# Patient Record
Sex: Male | Born: 1955
Health system: Southern US, Community
[De-identification: ages and names within clinical notes are randomized; demographics above are authoritative.]

## PROBLEM LIST (undated history)

## (undated) DIAGNOSIS — E119 Type 2 diabetes mellitus without complications: Secondary | ICD-10-CM

## (undated) DIAGNOSIS — F0390 Unspecified dementia without behavioral disturbance: Secondary | ICD-10-CM

## (undated) DIAGNOSIS — I10 Essential (primary) hypertension: Secondary | ICD-10-CM

## (undated) DIAGNOSIS — R6250 Unspecified lack of expected normal physiological development in childhood: Secondary | ICD-10-CM

## (undated) DIAGNOSIS — D869 Sarcoidosis, unspecified: Secondary | ICD-10-CM

## (undated) DIAGNOSIS — E785 Hyperlipidemia, unspecified: Secondary | ICD-10-CM

## (undated) HISTORY — DX: Hyperlipidemia, unspecified: E78.5

## (undated) HISTORY — DX: Essential (primary) hypertension: I10

## (undated) HISTORY — PX: TONSILLECTOMY: SUR1361

## (undated) HISTORY — DX: Unspecified dementia, unspecified severity, without behavioral disturbance, psychotic disturbance, mood disturbance, and anxiety: F03.90

## (undated) HISTORY — PX: ADENOIDECTOMY: SUR15

## (undated) HISTORY — DX: Type 2 diabetes mellitus without complications: E11.9

## (undated) HISTORY — DX: Sarcoidosis, unspecified: D86.9

---

## 1983-04-02 DIAGNOSIS — D869 Sarcoidosis, unspecified: Secondary | ICD-10-CM

## 1983-04-02 HISTORY — DX: Sarcoidosis, unspecified: D86.9

## 2007-09-16 ENCOUNTER — Ambulatory Visit: Payer: Self-pay | Admitting: Cardiology

## 2007-09-28 ENCOUNTER — Encounter: Payer: Self-pay | Admitting: Cardiology

## 2007-09-28 ENCOUNTER — Ambulatory Visit: Payer: Self-pay

## 2007-12-02 ENCOUNTER — Ambulatory Visit: Payer: Self-pay | Admitting: Cardiology

## 2009-01-26 ENCOUNTER — Encounter (INDEPENDENT_AMBULATORY_CARE_PROVIDER_SITE_OTHER): Payer: Self-pay | Admitting: *Deleted

## 2009-01-31 DIAGNOSIS — I10 Essential (primary) hypertension: Secondary | ICD-10-CM

## 2009-01-31 DIAGNOSIS — I152 Hypertension secondary to endocrine disorders: Secondary | ICD-10-CM | POA: Insufficient documentation

## 2009-01-31 DIAGNOSIS — E1159 Type 2 diabetes mellitus with other circulatory complications: Secondary | ICD-10-CM

## 2009-01-31 DIAGNOSIS — D869 Sarcoidosis, unspecified: Secondary | ICD-10-CM

## 2009-01-31 DIAGNOSIS — E119 Type 2 diabetes mellitus without complications: Secondary | ICD-10-CM | POA: Insufficient documentation

## 2009-02-07 ENCOUNTER — Encounter (INDEPENDENT_AMBULATORY_CARE_PROVIDER_SITE_OTHER): Payer: Self-pay | Admitting: *Deleted

## 2010-02-19 ENCOUNTER — Encounter: Payer: Self-pay | Admitting: Gastroenterology

## 2010-03-15 ENCOUNTER — Encounter (INDEPENDENT_AMBULATORY_CARE_PROVIDER_SITE_OTHER): Payer: Self-pay | Admitting: *Deleted

## 2010-03-19 ENCOUNTER — Encounter (INDEPENDENT_AMBULATORY_CARE_PROVIDER_SITE_OTHER): Payer: Self-pay | Admitting: *Deleted

## 2010-03-19 ENCOUNTER — Ambulatory Visit: Payer: Self-pay | Admitting: Gastroenterology

## 2010-04-04 ENCOUNTER — Ambulatory Visit
Admission: RE | Admit: 2010-04-04 | Discharge: 2010-04-04 | Payer: Self-pay | Source: Home / Self Care | Attending: Gastroenterology | Admitting: Gastroenterology

## 2010-04-04 ENCOUNTER — Encounter: Payer: Self-pay | Admitting: Gastroenterology

## 2010-04-04 LAB — HM COLONOSCOPY

## 2010-04-04 LAB — GLUCOSE, CAPILLARY
Glucose-Capillary: 139 mg/dL — ABNORMAL HIGH (ref 70–99)
Glucose-Capillary: 123 mg/dL — ABNORMAL HIGH (ref 70–99)

## 2010-04-09 ENCOUNTER — Encounter: Payer: Self-pay | Admitting: Gastroenterology

## 2010-05-01 NOTE — Letter (Signed)
Summary: Pre Visit Letter Revised  [facility]  [address] [place], [place] [address]   Phone: [phone]  Fax: [phone]        02/19/2010 MRN: [id number]  Eric Stuart [address] [place], [place]  [address]             Procedure Date:  04-04-2010 9am           Dr Arlyce Dice   Welcome to the Gastroenterology Division at [facility].    You are scheduled to see a nurse for your pre-procedure visit on 03-19-10 at 2:30pm on the 3rd floor at [facility], [address]  We ask that you try to arrive at our office 15 minutes prior to your appointment time to allow for check-in.  Please take a minute to review the attached form.  If you answer "Yes" to one or more of the questions on the first page, we ask that you call the person listed at your earliest opportunity.  If you answer "No" to all of the questions, please complete the rest of the form and bring it to your appointment.    Your nurse visit will consist of discussing your medical and surgical history, your immediate family medical history, and your medications.   If you are unable to list all of your medications on the form, please bring the medication bottles to your appointment and we will list them.  We will need to be aware of both prescribed and over the counter drugs.  We will need to know exact dosage information as well.    Please be prepared to read and sign documents such as consent forms, a financial agreement, and acknowledgement forms.  If necessary, and with your consent, a friend or relative is welcome to sit-in on the nurse visit with you.  Please bring your insurance card so that we may make a copy of it.  If your insurance requires a referral to see a specialist, please bring your referral form from your primary care physician.  No co-pay is required for this nurse visit.     If you cannot keep your appointment, please call [phone] to cancel or reschedule prior to your  appointment date.  This allows [place] the opportunity to schedule an appointment for another patient in need of care.    Thank you for choosing [facility] for your medical needs.  We appreciate the opportunity to care for you.  Please visit [place] at our website  to learn more about our practice.  Sincerely, The Gastroenterology Division

## 2010-05-03 NOTE — Procedures (Signed)
Summary: Colonoscopy  Patient: Eric Stuart Note: All result statuses are Final unless otherwise noted.  Tests: (1) Colonoscopy (COL)   COL Colonoscopy           DONE     [facility]     [address]     [place], [place]  [address]           COLONOSCOPY PROCEDURE REPORT           PATIENT:  Eric Stuart  MR#:  [id number]     BIRTHDATE:  January 03, 1956, 54 yrs. old  GENDER:  male           ENDOSCOPIST:  Barbette Hair. Arlyce Dice, MD     Referred by:  Hali Marry, M.D.     Charisse Klinefelter, M.D.           PROCEDURE DATE:  04/04/2010     PROCEDURE:  Colonoscopy with snare polypectomy     ASA CLASS:  Class II     INDICATIONS:  1) Routine Risk Screening           MEDICATIONS:   Fentanyl 50 mcg IV, Versed 5 mg IV           DESCRIPTION OF PROCEDURE:   After the risks benefits and     alternatives of the procedure were thoroughly explained, informed     consent was obtained.  Digital rectal exam was performed and     revealed no abnormalities.   The LB [id number] endoscope     was introduced through the anus and advanced to the cecum, which     was identified by both the appendix and ileocecal valve, limited     by poor preparation.  Moderate amount of retained solid and liquid     stool  The quality of the prep was Moviprep fair.  The instrument     was then slowly withdrawn as the colon was fully examined.     <<PROCEDUREIMAGES>>           FINDINGS:  A sessile polyp was found in the sigmoid colon. It was     12 mm in size. It was found 20 cm from the point of entry. Polyp     was snared, then cauterized with monopolar cautery. Retrieval was     successful (see image9). snare polyp  This was otherwise a normal     examination of the colon (see image2, image3, image5, image6,     image7, image10, and image12).   Retroflexed views in the rectum     revealed no abnormalities.    The time to cecum =  1.50  minutes.     The scope was then withdrawn (time =  7.0  min) from the  patient     and the procedure completed.           COMPLICATIONS:  None           ENDOSCOPIC IMPRESSION:     1) 12 mm sessile polyp in the sigmoid colon     2) Otherwise normal examination     RECOMMENDATIONS:     1) Colonoscopy 3 years           REPEAT EXAM:   3 year(s) Colonoscopy (decreased f/u interval due     to limitations of exam from prep and polyp size)           ______________________________     Barbette Hair. Arlyce Dice, MD  CC:           n.     eSIGNED:   Barbette Hair. Kaplan at 04/04/2010 09:21 AM           Henreitta Cea, [id number]  Note: An exclamation mark (!) indicates a result that was not dispersed into the flowsheet. Document Creation Date: 04/04/2010 9:21 AM _______________________________________________________________________  (1) Order result status: Final Collection or observation date-time: 04/04/2010 09:11 Requested date-time:  Receipt date-time:  Reported date-time:  Referring Physician:   Ordering Physician: Melvia Heaps [phone]) Specimen Source:  Source: Launa Grill Order Number: [id number] Lab site:   Appended Document: Colonoscopy     Procedures Next Due Date:    Colonoscopy: 04/2013

## 2010-05-03 NOTE — Letter (Signed)
Summary: [facility] Instructions  [facility]  [address] [place], [place] [address]   Phone: [phone]  Fax: [phone]       Eric Stuart    [birth year]    MRN: [id number]        Procedure Day /Date:  04/04/10  Wednesday     Arrival Time:  8:00am      Procedure Time:  9:00am     Location of Procedure:                    _ x_  [facility] (4th Floor)                        PREPARATION FOR COLONOSCOPY WITH MOVIPREP   Starting 5 days prior to your procedure _12/31/11 _ do not eat nuts, seeds, popcorn, corn, beans, peas,  salads, or any raw vegetables.  Do not take any fiber supplements (e.g. Metamucil, Citrucel, and Benefiber).  THE DAY BEFORE YOUR PROCEDURE         DATE:  04/03/10   DAY:  Tuesday  1.  Drink clear liquids the entire day-NO SOLID FOOD  2.  Do not drink anything colored red or purple.  Avoid juices with pulp.  No orange juice.  3.  Drink at least 64 oz. (8 glasses) of fluid/clear liquids during the day to prevent dehydration and help the prep work efficiently.  CLEAR LIQUIDS INCLUDE: Water Jello Ice Popsicles Tea (sugar ok, no milk/cream) Powdered fruit flavored drinks Coffee (sugar ok, no milk/cream) Gatorade Juice: apple, white grape, white cranberry  Lemonade Clear bullion, consomm, broth Carbonated beverages (any kind) Strained chicken noodle soup Hard Candy                             4.  In the morning, mix first dose of MoviPrep solution:    Empty 1 Pouch A and 1 Pouch B into the disposable container    Add lukewarm drinking water to the top line of the container. Mix to dissolve    Refrigerate (mixed solution should be used within 24 hrs)  5.  Begin drinking the prep at 5:00 p.m. The MoviPrep container is divided by 4 marks.   Every 15 minutes drink the solution down to the next mark (approximately 8 oz) until the full liter is complete.   6.  Follow completed prep with 16 oz of clear liquid of your  choice (Nothing red or purple).  Continue to drink clear liquids until bedtime.  7.  Before going to bed, mix second dose of MoviPrep solution:    Empty 1 Pouch A and 1 Pouch B into the disposable container    Add lukewarm drinking water to the top line of the container. Mix to dissolve    Refrigerate  THE DAY OF YOUR PROCEDURE      DATE:  04/04/10  DAY:  Wednesday  Beginning at  4:00 a.m. (5 hours before procedure):         1. Every 15 minutes, drink the solution down to the next mark (approx 8 oz) until the full liter is complete.  2. Follow completed prep with 16 oz. of clear liquid of your choice.    3. You may drink clear liquids until   7:00am  (2 HOURS BEFORE PROCEDURE).   MEDICATION INSTRUCTIONS  Unless otherwise instructed, you should take regular prescription medications with a  small sip of water   as early as possible the morning of your procedure.  Diabetic patients - see separate instructions.          OTHER INSTRUCTIONS  You will need a responsible adult at least 54 years of age to accompany you and drive you home.   This person must remain in the waiting room during your procedure.  Wear loose fitting clothing that is easily removed.  Leave jewelry and other valuables at home.  However, you may wish to bring a book to read or  an iPod/MP3 player to listen to music as you wait for your procedure to start.  Remove all body piercing jewelry and leave at home.  Total time from sign-in until discharge is approximately 2-3 hours.  You should go home directly after your procedure and rest.  You can resume normal activities the  day after your procedure.  The day of your procedure you should not:   Drive   Make legal decisions   Operate machinery   Drink alcohol   Return to work  You will receive specific instructions about eating, activities and medications before you leave.    The above instructions have been reviewed and explained to me by    Ezra Sites RN  March 19, 2010 2:48 PM     I fully understand and can verbalize these instructions _____________________________ Date _________

## 2010-05-03 NOTE — Letter (Signed)
Summary: Diabetic Instructions  [facility]  [address] [place], [place] [address]   Phone: [phone]  Fax: [phone]    Eric Stuart Aug 20, 1955 MRN: [id number]   Metformin and Glyburide  ORAL DIABETIC MEDICATION INSTRUCTIONS  The day before your procedure:   Take your diabetic pill as you do normally  The day of your procedure:   Do not take your diabetic pill    We will check your blood sugar levels during the admission process and again in Recovery before discharging you home  ________________________________________________________________________

## 2010-05-03 NOTE — Miscellaneous (Signed)
Summary: [facility]  Clinical Lists Changes  Medications: Added new medication of MOVIPREP 100 GM  SOLR (PEG-KCL-NACL-NASULF-NA ASC-C) As per prep instructions. - Signed Rx of MOVIPREP 100 GM  SOLR (PEG-KCL-NACL-NASULF-NA ASC-C) As per prep instructions.;  #1 x 0;  Signed;  Entered by: Ezra Sites RN;  Authorized by: Louis Meckel MD;  Method used: Electronically to [facility] [phone]*, [address], [place], [place], [place]  [address], Ph: [phone] or [phone], Fax: [phone] Observations: Added new observation of NKA: T (03/19/2010 14:05)    Prescriptions: MOVIPREP 100 GM  SOLR (PEG-KCL-NACL-NASULF-NA ASC-C) As per prep instructions.  #1 x 0   Entered by:   Ezra Sites RN   Authorized by:   Louis Meckel MD   Signed by:   Ezra Sites RN on 03/19/2010   Method used:   Electronically to        [facility] [phone]* (retail)       [address] [place], [place]  [address]       Ph: [phone] or [phone]       Fax: [phone]   RxID:   [id number]

## 2010-05-03 NOTE — Letter (Signed)
Summary: Patient Notice- Polyp Results  [facility]  [address] [place], [place] [address]   Phone: [phone]  Fax: [phone]        April 09, 2010 MRN: [id number]    Eric Stuart [address] [place], [place]  [address]    Dear [name],  I am pleased to inform you that the colon polyp(s) removed during your recent colonoscopy was (were) found to be benign (no cancer detected) upon pathologic examination.  I recommend you have a repeat colonoscopy examination in 3_ years to look for recurrent polyps, as having colon polyps increases your risk for having recurrent polyps or even colon cancer in the future.  Should you develop new or worsening symptoms of abdominal pain, bowel habit changes or bleeding from the rectum or bowels, please schedule an evaluation with either your primary care physician or with me.  Additional information/recommendations:  __ No further action with gastroenterology is needed at this time. Please      follow-up with your primary care physician for your other healthcare      needs.  __ Please call [phone] to schedule a return visit to review your      situation.  __ Please keep your follow-up visit as already scheduled.  _x_ Continue treatment plan as outlined the day of your exam.  Please call us if you are having persistent problems or have questions about your condition that have not been fully answered at this time.  Sincerely,  Louis Meckel MD  This letter has been electronically signed by your physician.  Appended Document: Patient Notice- Polyp Results Letter mailed

## 2010-08-14 NOTE — Assessment & Plan Note (Signed)
[facility]                            CARDIOLOGY OFFICE NOTE   Eric Stuart, Eric Stuart                       MRN:          [id number]  DATE:12/02/2007                            DOB:          1955-07-30    PRIMARY CARE PHYSICIAN:  Delaney Meigs, MD   REASON FOR PRESENTATION:  Evaluate the patient with hypertension and an  abnormal EKG.   HISTORY OF PRESENT ILLNESS:  The patient is a pleasant 55 year old  gentleman who I saw in June for evaluation of an abnormal EKG.  He had a  history of hypertension which had been recently diagnosed and was  somewhat elevated when I saw him.  He did not continue his medicines,  and I encouraged him to comply.  I did get an echocardiogram which  demonstrated well-preserved ejection fraction with some evidence of left  ventricular hypertrophy.  This is likely because of the appearance of  the abnormal EKG, which resembles LVH with strain.  The patient has been  taking his amlodipine.  He returns now for followup.  He has been having  no symptoms.  He pedals his bicycle all over the place and gets no chest  discomfort, neck or arm discomfort.  He gets no shortness of breath,  denies any PND or orthopnea.  He has had no palpitations, presyncope, or  syncope.   PAST MEDICAL HISTORY:  Hypertension recently diagnosed, diabetes  mellitus, and sarcoidosis treated in the past with steroids.   ALLERGIES:  None.   MEDICATIONS:  1. Omeprazole 20 mg b.i.d.  2. Amlodipine 5 mg daily.  3. Metformin 500 mg b.i.d.   REVIEW OF SYSTEMS:  As stated in the HPI and otherwise negative for all  other systems.   PHYSICAL EXAMINATION:  GENERAL:  The patient is in no distress.  VITAL SIGNS:  Blood pressure 160/102 and heart rate 52 and regular.  HEENT:  Eyelids unremarkable.  Pupils equal, round and reactive to  light.  Fundi within normal limits.  Oral mucosa unremarkable.  NECK:  No jugular venous distension at 45 degrees,  carotid upstroke  brisk and symmetrical, no bruits, no thyromegaly.  LYMPHATICS:  No cervical, axillary or inguinal adenopathy.  LUNGS:  Clear to auscultation bilaterally.  BACK:  No costovertebral angle tenderness.  CHEST:  Unremarkable.  HEART:  PMI not displaced or sustained.  S1 and S2 within normal limits.  No S3, no S4.  No clicks, no rubs, no murmurs.  ABDOMEN:  Flat, positive bowel sounds, normal in frequency and pitch.  No bruits.  No rebound, no guarding, no midline pulsatile mass.  No  hepatomegaly, no splenomegaly.  SKIN:  No rashes, no nodules.  EXTREMITIES:  2+ pulses throughout.  No edema, no cyanosis, no clubbing.  NEURO:  Oriented to person, place and time.  Cranial nerves II through  XII grossly intact.  Motor grossly intact.   EKG, sinus rhythm, rate 52, axis within normal limits, intervals within  normal limits, left ventricular hypertrophy with repolarization changes  with no change from previous EKGs.   ASSESSMENT AND PLAN:  1. Abnormal EKG.  This really likely represents left ventricular      hypertrophy with repolarization.  No further cardiovascular testing      is suggested.  2. Hypertension.  Blood pressure is still not well controlled.  I am      going to increase his amlodipine to 10 mg daily.  3. Diabetes.  He continues on metformin as he was followed by Dr. Ardeen Garland.  4. Dyslipidemia.  The patient did have a lipid profile and was to be      on Crestor.  I do not see that he started this and we will call to      clarify this.  I agree with aggressive management.  5. Followup.  I will see him in 6 months or sooner if needed.     Rollene Rotunda, MD, [facility]  Electronically Signed    JH/MedQ  DD: 12/02/2007  DT: 12/03/2007  Job #: [id number]   cc:   Delaney Meigs, M.D.

## 2010-08-14 NOTE — Assessment & Plan Note (Signed)
[facility]                            CARDIOLOGY OFFICE NOTE   Eric, Stuart                       MRN:          [id number]  DATE:09/16/2007                            DOB:          1955/05/03    PRIMARY CARE PHYSICIAN:  Delaney Meigs, MD   REASON FOR CONSULTATION:  Evaluate the patient with bradycardia and an  abnormal EKG.   HISTORY OF PRESENT ILLNESS:  The patient is a pleasant 55 year old  gentleman without prior cardiac history.  However, it does not seem like  he has actually seen physicians very frequently.  He has recently been  diagnosed with hypertension and diabetes, but he just started seeing Dr.  Lysbeth Galas.  He was placed on amlodipine, but actually he ran out of this  medicine, and his sister is not sure for how long.  The patient lives  with his sister.  He gets around with a bicycle.  He has been doing this  since he was a child.  He pedals many miles every day.  With this  activity, he denies any chest discomfort, neck, or arm discomfort.  He  has had no palpitations, presyncope, or syncope.  He has had no PND or  orthopnea.   Of note, the patient has been noted to have sinus bradycardia with the  rates in the 50s.  He also has an EKG with left ventricular hypertrophy  by voltage criteria with a strain pattern.   PAST MEDICAL HISTORY:  1. Hypertension, recently diagnosed.  2. Diabetes mellitus, recently diagnosed.  3. Sarcoidosis, treated in the past with steroids.   PAST SURGICAL HISTORY:  None.   ALLERGIES:  None.   MEDICATIONS:  1. Omeprazole 20 mg b.i.d.  2. Amlodipine 5 mg daily.  3. Metformin 500 mg b.i.d.   SOCIAL HISTORY:  The patient is single.  He does not have any children.  He lives with his sister.  He smoked rarely for the past 20 years.  He  does not drink alcohol.   FAMILY HISTORY:  Noncontributory for early coronary artery disease.  His  father did die of congestive heart failure at 19.   REVIEW OF SYSTEMS:  As stated in the HPI and otherwise positive for  reflux.  Negative for all other systems.   PHYSICAL EXAMINATION:  The patient is in no distress.  Blood pressure  188/102, heart rate 51 and regular, body mass index 24, and weight 141  pounds.  HEENT:  Eyes unremarkable; pupils equal, round, and react to light;  fundi not visualized; oral mucosa is unremarkable.  NECK:  No jugular venous distention; waveform within normal limits;  carotid upstroke brisk and symmetrical, no bruits, and no thyromegaly.  LYMPHATICS:  No cervical, axillary, or inguinal adenopathy.  LUNGS:  Clear to auscultation bilaterally.  BACK:  No costovertebral angle tenderness.  CHEST:  Unremarkable.  HEART:  PMI displaced somewhat laterally; S1 and S2 within normal  limits; no S3, no S4, no clicks, no rubs, no murmurs.  ABDOMEN:  Flat; positive bowel sounds normal in frequency and pitch; no  bruits, no rebound,  no guarding, no midline pulsatile mass, no  hepatomegaly, no splenomegaly.  SKIN:  No rashes, no nodules.  EXTREMITIES:  Pulses 2+ throughout; no edema, no cyanosis, no clubbing.  NEURO:  Oriented to person, place, and time, cranial nerves II-XII  grossly intact, motor grossly intact.   ASSESSMENT AND PLAN:  1. Hypertension.  The patient's blood pressure is elevated, but he did      not take his amlodipine.  He has been out of it for a few days.  He      will get this refill today and start it.  I have encouraged him to      wait about 10 days and then go to see Dr. Lysbeth Galas again for blood      pressure check.  2. Abnormal EKG.  The patient most likely has left ventricular      hypertrophy.  I am going to get an echocardiogram to evaluate to      what degree and make sure he has normal left ventricular systolic      function.  We will also make sure there is no occult valvular      disease.  3. Bradycardia.  The patient is not having any symptoms related to      this.  He has not had  any presyncope or syncope.  He probably has      bradycardia based on the fact that he has a long history of      exercise, pedaling his bicycle miles daily.  At this point, in the      absence of any other symptoms, I would not suggest further testing      is necessary.  In particular, I do not suspect sarcoid involvement,      though again we will get the echocardiogram to see if there is any      evidence of any wall motion abnormalities.  4. Followup will be based on the results of the above.     Rollene Rotunda, MD, [facility]  Electronically Signed    JH/MedQ  DD: 09/16/2007  DT: 09/17/2007  Job #: [id number]   cc:   Delaney Meigs, M.D.

## 2010-08-31 ENCOUNTER — Encounter: Payer: Self-pay | Admitting: Physician Assistant

## 2011-09-27 DIAGNOSIS — I10 Essential (primary) hypertension: Secondary | ICD-10-CM | POA: Diagnosis not present

## 2011-09-27 DIAGNOSIS — E785 Hyperlipidemia, unspecified: Secondary | ICD-10-CM | POA: Diagnosis not present

## 2011-10-29 DIAGNOSIS — H524 Presbyopia: Secondary | ICD-10-CM | POA: Diagnosis not present

## 2011-10-29 DIAGNOSIS — H52229 Regular astigmatism, unspecified eye: Secondary | ICD-10-CM | POA: Diagnosis not present

## 2011-10-29 DIAGNOSIS — H5200 Hypermetropia, unspecified eye: Secondary | ICD-10-CM | POA: Diagnosis not present

## 2011-10-29 DIAGNOSIS — E119 Type 2 diabetes mellitus without complications: Secondary | ICD-10-CM | POA: Diagnosis not present

## 2012-04-02 DIAGNOSIS — I10 Essential (primary) hypertension: Secondary | ICD-10-CM | POA: Diagnosis not present

## 2012-04-02 DIAGNOSIS — Z125 Encounter for screening for malignant neoplasm of prostate: Secondary | ICD-10-CM | POA: Diagnosis not present

## 2012-04-02 DIAGNOSIS — E785 Hyperlipidemia, unspecified: Secondary | ICD-10-CM | POA: Diagnosis not present

## 2012-04-02 DIAGNOSIS — E559 Vitamin D deficiency, unspecified: Secondary | ICD-10-CM | POA: Diagnosis not present

## 2012-04-27 DIAGNOSIS — Z23 Encounter for immunization: Secondary | ICD-10-CM | POA: Diagnosis not present

## 2012-09-04 ENCOUNTER — Ambulatory Visit (INDEPENDENT_AMBULATORY_CARE_PROVIDER_SITE_OTHER): Payer: Medicaid Other | Admitting: Physician Assistant

## 2012-09-04 ENCOUNTER — Encounter: Payer: Self-pay | Admitting: Physician Assistant

## 2012-09-04 VITALS — BP 183/120 | HR 68 | Temp 97.4°F | Ht 61.0 in | Wt 125.0 lb

## 2012-09-04 DIAGNOSIS — E785 Hyperlipidemia, unspecified: Secondary | ICD-10-CM

## 2012-09-04 DIAGNOSIS — E119 Type 2 diabetes mellitus without complications: Secondary | ICD-10-CM

## 2012-09-04 DIAGNOSIS — I10 Essential (primary) hypertension: Secondary | ICD-10-CM | POA: Diagnosis not present

## 2012-09-04 LAB — POCT GLYCOSYLATED HEMOGLOBIN (HGB A1C): Hemoglobin A1C: 6.4

## 2012-09-04 NOTE — Progress Notes (Signed)
Subjective:     Patient ID: Eric Stuart, male   DOB: 05/04/1955, 57 y.o.   MRN: [id number]  HPI Pt here for recheck of his diabetes, HTN, hyperlipid He states he has been doing well Denies CP,SOB, or lower ext edema He has not been doing his foot checks or BS checks Does not remember when his last eye exam   Review of Systems  All other systems reviewed and are negative.       Objective:   Physical Exam  Nursing note and vitals reviewed.  No JVD/Bruits Heart- RRR w/o M Lungs- CTA Pulses equal in upper ext No lower ext edema ulceration    Assessment:     1. DM   2. HTN (hypertension)   3. Other and unspecified hyperlipidemia        Plan:     Will stop Zestril Increase Benicar 40mg  1 po qd Cont all other meds BS in good control Needs eye exam Reviewed foot checks Recheck in 3-4 wks F/U prn

## 2012-09-04 NOTE — Patient Instructions (Signed)

## 2012-09-05 LAB — BASIC METABOLIC PANEL WITH GFR
BUN: 10 mg/dL (ref 6–23)
CO2: 28 mEq/L (ref 19–32)
Calcium: 10 mg/dL (ref 8.4–10.5)
Chloride: 105 mEq/L (ref 96–112)
Creat: 0.8 mg/dL (ref 0.50–1.35)
GFR, Est African American: >89 mL/min
GFR, Est Non African American: >89 mL/min
Glucose, Bld: 113 mg/dL — ABNORMAL HIGH (ref 70–99)
Potassium: 4.3 mEq/L (ref 3.5–5.3)
Sodium: 141 mEq/L (ref 135–145)

## 2012-09-05 LAB — HEPATIC FUNCTION PANEL
ALT: 13 U/L (ref 0–53)
AST: 17 U/L (ref 0–37)
Albumin: 4.6 g/dL (ref 3.5–5.2)
Alkaline Phosphatase: 51 U/L (ref 39–117)
Bilirubin, Direct: 0.2 mg/dL (ref 0.0–0.3)
Indirect Bilirubin: 0.8 mg/dL (ref 0.0–0.9)
Total Bilirubin: 1 mg/dL (ref 0.3–1.2)
Total Protein: 7.6 g/dL (ref 6.0–8.3)

## 2012-09-05 LAB — LIPID PANEL
Cholesterol: 134 mg/dL (ref 0–200)
HDL: 63 mg/dL (ref >39)
LDL Cholesterol: 61 mg/dL (ref 0–99)
Total CHOL/HDL Ratio: 2.1 Ratio
Triglycerides: 48 mg/dL (ref <150)
VLDL: 10 mg/dL (ref 0–40)

## 2012-09-07 ENCOUNTER — Telehealth: Payer: Self-pay | Admitting: *Deleted

## 2012-09-08 NOTE — Telephone Encounter (Signed)
lmtcb for lab results

## 2012-09-15 NOTE — Telephone Encounter (Signed)
Pt was informed of lab results.

## 2012-09-25 ENCOUNTER — Ambulatory Visit (INDEPENDENT_AMBULATORY_CARE_PROVIDER_SITE_OTHER): Payer: Medicare Other | Admitting: Physician Assistant

## 2012-09-25 ENCOUNTER — Encounter: Payer: Self-pay | Admitting: Physician Assistant

## 2012-09-25 DIAGNOSIS — I10 Essential (primary) hypertension: Secondary | ICD-10-CM

## 2012-09-25 MED ORDER — OLMESARTAN MEDOXOMIL-HCTZ 40-25 MG PO TABS: 1.0000 | ORAL_TABLET | Freq: Every day | ORAL | Status: DC

## 2012-09-25 NOTE — Patient Instructions (Signed)

## 2012-09-25 NOTE — Progress Notes (Signed)
Subjective:     Patient ID: Eric Stuart, male   DOB: [birth year], 56 y.o.   MRN: [id number]  HPI Pt here for recheck of his HTN He was on an ACE/ARB combination We stopped the Zestril and increased the dose of Benicar Pt denies and CP, SOB, HA, or lower ext edema since last visit  Review of Systems  All other systems reviewed and are negative.       Objective:   Physical Exam  Nursing note and vitals reviewed. No bruits/JVD Heart- RRR w/o M Lungs- CTA bilat Pulses equal in upper ext     Assessment:     HTN    Plan:     BP still suboptimal Samples Benicar 40/25 given today Recheck in 3-4 weeks Prev Bun/creat good

## 2012-10-26 ENCOUNTER — Other Ambulatory Visit: Payer: Self-pay

## 2012-10-26 ENCOUNTER — Other Ambulatory Visit: Payer: Self-pay | Admitting: Family Medicine

## 2012-10-26 DIAGNOSIS — I10 Essential (primary) hypertension: Secondary | ICD-10-CM

## 2012-10-26 MED ORDER — ROSUVASTATIN CALCIUM 10 MG PO TABS: 10.0000 mg | ORAL_TABLET | Freq: Every day | ORAL | Status: DC

## 2012-10-26 MED ORDER — METFORMIN HCL 1000 MG PO TABS: 1000.0000 mg | ORAL_TABLET | Freq: Two times a day (BID) | ORAL | Status: DC

## 2012-10-26 MED ORDER — OLMESARTAN MEDOXOMIL-HCTZ 40-25 MG PO TABS: 1.0000 | ORAL_TABLET | Freq: Every day | ORAL | Status: DC

## 2012-10-26 NOTE — Telephone Encounter (Signed)
Patient is not on Sertraline  Disregard previous message

## 2012-10-26 NOTE — Telephone Encounter (Signed)
Patient seen 09/25/12  WLW  Lisinopril and Sertraline not on patients med list in [facility]

## 2012-10-27 ENCOUNTER — Ambulatory Visit (INDEPENDENT_AMBULATORY_CARE_PROVIDER_SITE_OTHER): Payer: Medicare Other | Admitting: Physician Assistant

## 2012-10-27 ENCOUNTER — Encounter: Payer: Self-pay | Admitting: Physician Assistant

## 2012-10-27 ENCOUNTER — Ambulatory Visit: Payer: Medicare Other | Admitting: Physician Assistant

## 2012-10-27 VITALS — BP 169/92 | HR 53 | Temp 97.5°F | Wt 124.6 lb

## 2012-10-27 DIAGNOSIS — I10 Essential (primary) hypertension: Secondary | ICD-10-CM

## 2012-10-27 MED ORDER — OLMESARTAN-AMLODIPINE-HCTZ 40-5-12.5 MG PO TABS: 1.0000 | ORAL_TABLET | Freq: Every day | ORAL | Status: DC

## 2012-10-27 NOTE — Progress Notes (Signed)
Subjective:     Patient ID: Eric Stuart, male   DOB: [birth year], 56 y.o.   MRN: [id number]  Hypertension  Pt here for recheck of HTN States he has been tolerating the new meds well No c/o HA, CP, SOB, lower ext edema, or vertigo with change in position   Review of Systems     Objective:   Physical Exam No JVD/Bruits Heart- RRR w/o M Lungs- CTA No lower ext edema Pulses equal in upper ext     Assessment:     HTN    Plan:     BP still sub optimal so will switch to Tribenzor 40/5/12.5 Savings card given to pt Would like him to F/U in 1 month

## 2012-10-27 NOTE — Patient Instructions (Signed)

## 2012-11-27 ENCOUNTER — Ambulatory Visit (INDEPENDENT_AMBULATORY_CARE_PROVIDER_SITE_OTHER): Payer: Medicare Other | Admitting: Family Medicine

## 2012-11-27 ENCOUNTER — Encounter: Payer: Self-pay | Admitting: Family Medicine

## 2012-11-27 VITALS — BP 132/78 | HR 53 | Temp 97.7°F | Ht 61.0 in | Wt 123.0 lb

## 2012-11-27 DIAGNOSIS — I10 Essential (primary) hypertension: Secondary | ICD-10-CM

## 2012-11-27 NOTE — Progress Notes (Signed)
  Subjective:    Patient ID: Eric Stuart, male    DOB: 08-03-1955, 57 y.o.   MRN: [id number]  HPI This 57 y.o. male presents for evaluation of hypertension.  He has been taking the bp medicine and it is controlling his bp.  He states he is feeling better .   Review of Systems No chest pain, SOB, HA, dizziness, vision change, N/V, diarrhea, constipation, dysuria, urinary urgency or frequency, myalgias, arthralgias or rash.     Objective:   Physical Exam Vital signs noted  Well developed well nourished male.  HEENT - Head atraumatic Normocephalic                Eyes - PERRLA, Conjuctiva - clear Sclera- Clear EOMI                Ears - EAC's Wnl TM's Wnl Gross Hearing WNL                Nose - Nares patent                 Throat - oropharanx wnl Respiratory - Lungs CTA bilateral Cardiac - RRR S1 and S2 without murmur GI - Abdomen soft Nontender and bowel sounds active x 4 Extremities - No edema. Neuro - Grossly intact.       Assessment & Plan:  Essential hypertension, benign - Plan: BMP8+EGFR Continue current regimen and followup in 3 months

## 2012-11-27 NOTE — Patient Instructions (Addendum)
Smoking Cessation Quitting smoking is important to your health and has many advantages. However, it is not always easy to quit since nicotine is a very addictive drug. Often times, people try 3 times or more before being able to quit. This document explains the best ways for you to prepare to quit smoking. Quitting takes hard work and a lot of effort, but you can do it. ADVANTAGES OF QUITTING SMOKING  You will live longer, feel better, and live better.  Your body will feel the impact of quitting smoking almost immediately.  Within 20 minutes, blood pressure decreases. Your pulse returns to its normal level.  After 8 hours, carbon monoxide levels in the blood return to normal. Your oxygen level increases.  After 24 hours, the chance of having a heart attack starts to decrease. Your breath, hair, and body stop smelling like smoke.  After 48 hours, damaged nerve endings begin to recover. Your sense of taste and smell improve.  After 72 hours, the body is virtually free of nicotine. Your bronchial tubes relax and breathing becomes easier.  After 2 to 12 weeks, lungs can hold more air. Exercise becomes easier and circulation improves.  The risk of having a heart attack, stroke, cancer, or lung disease is greatly reduced.  After 1 year, the risk of coronary heart disease is cut in half.  After 5 years, the risk of stroke falls to the same as a nonsmoker.  After 10 years, the risk of lung cancer is cut in half and the risk of other cancers decreases significantly.  After 15 years, the risk of coronary heart disease drops, usually to the level of a nonsmoker.  If you are pregnant, quitting smoking will improve your chances of having a healthy baby.  The people you live with, especially any children, will be healthier.  You will have extra money to spend on things other than cigarettes. QUESTIONS TO THINK ABOUT BEFORE ATTEMPTING TO QUIT You may want to talk about your answers with your  caregiver.  Why do you want to quit?  If you tried to quit in the past, what helped and what did not?  What will be the most difficult situations for you after you quit? How will you plan to handle them?  Who can help you through the tough times? Your family? Friends? A caregiver?  What pleasures do you get from smoking? What ways can you still get pleasure if you quit? Here are some questions to ask your caregiver:  How can you help me to be successful at quitting?  What medicine do you think would be best for me and how should I take it?  What should I do if I need more help?  What is smoking withdrawal like? How can I get information on withdrawal? GET READY  Set a quit date.  Change your environment by getting rid of all cigarettes, ashtrays, matches, and lighters in your home, car, or work. Do not let people smoke in your home.  Review your past attempts to quit. Think about what worked and what did not. GET SUPPORT AND ENCOURAGEMENT You have a better chance of being successful if you have help. You can get support in many ways.  Tell your family, friends, and co-workers that you are going to quit and need their support. Ask them not to smoke around you.  Get individual, group, or telephone counseling and support. Programs are available at local hospitals and health centers. Call your local health department for   information about programs in your area.  Spiritual beliefs and practices may help some smokers quit.  Download a "quit meter" on your computer to keep track of quit statistics, such as how long you have gone without smoking, cigarettes not smoked, and money saved.  Get a self-help book about quitting smoking and staying off of tobacco. LEARN NEW SKILLS AND BEHAVIORS  Distract yourself from urges to smoke. Talk to someone, go for a walk, or occupy your time with a task.  Change your normal routine. Take a different route to work. Drink tea instead of coffee.  Eat breakfast in a different place.  Reduce your stress. Take a hot bath, exercise, or read a book.  Plan something enjoyable to do every day. Reward yourself for not smoking.  Explore interactive web-based programs that specialize in helping you quit. GET MEDICINE AND USE IT CORRECTLY Medicines can help you stop smoking and decrease the urge to smoke. Combining medicine with the above behavioral methods and support can greatly increase your chances of successfully quitting smoking.  Nicotine replacement therapy helps deliver nicotine to your body without the negative effects and risks of smoking. Nicotine replacement therapy includes nicotine gum, lozenges, inhalers, nasal sprays, and skin patches. Some may be available over-the-counter and others require a prescription.  Antidepressant medicine helps people abstain from smoking, but how this works is unknown. This medicine is available by prescription.  Nicotinic receptor partial agonist medicine simulates the effect of nicotine in your brain. This medicine is available by prescription. Ask your caregiver for advice about which medicines to use and how to use them based on your health history. Your caregiver will tell you what side effects to look out for if you choose to be on a medicine or therapy. Carefully read the information on the package. Do not use any other product containing nicotine while using a nicotine replacement product.  RELAPSE OR DIFFICULT SITUATIONS Most relapses occur within the first 3 months after quitting. Do not be discouraged if you start smoking again. Remember, most people try several times before finally quitting. You may have symptoms of withdrawal because your body is used to nicotine. You may crave cigarettes, be irritable, feel very hungry, cough often, get headaches, or have difficulty concentrating. The withdrawal symptoms are only temporary. They are strongest when you first quit, but they will go away within  10 14 days. To reduce the chances of relapse, try to:  Avoid drinking alcohol. Drinking lowers your chances of successfully quitting.  Reduce the amount of caffeine you consume. Once you quit smoking, the amount of caffeine in your body increases and can give you symptoms, such as a rapid heartbeat, sweating, and anxiety.  Avoid smokers because they can make you want to smoke.  Do not let weight gain distract you. Many smokers will gain weight when they quit, usually less than 10 pounds. Eat a healthy diet and stay active. You can always lose the weight gained after you quit.  Find ways to improve your mood other than smoking. FOR MORE INFORMATION  www.smokefree.gov  Document Released: 03/12/2001 Document Revised: 09/17/2011 Document Reviewed: 06/27/2011 [employer] Patient Information 2014 [employer].  

## 2012-11-28 ENCOUNTER — Other Ambulatory Visit: Payer: Self-pay | Admitting: Physician Assistant

## 2012-11-28 LAB — BMP8+EGFR
BUN/Creatinine Ratio: 22 — ABNORMAL HIGH (ref 9–20)
BUN: 24 mg/dL (ref 6–24)
CO2: 26 mmol/L (ref 18–29)
Calcium: 10 mg/dL (ref 8.7–10.2)
Chloride: 102 mmol/L (ref 97–108)
Creatinine, Ser: 1.07 mg/dL (ref 0.76–1.27)
GFR calc Af Amer: 89 mL/min/{1.73_m2} (ref >59)
GFR calc non Af Amer: 77 mL/min/{1.73_m2} (ref >59)
Glucose: 118 mg/dL — ABNORMAL HIGH (ref 65–99)
Potassium: 4.7 mmol/L (ref 3.5–5.2)
Sodium: 143 mmol/L (ref 134–144)

## 2012-12-02 ENCOUNTER — Telehealth: Payer: Self-pay | Admitting: Family Medicine

## 2012-12-10 NOTE — Telephone Encounter (Signed)
Lm,labs normal.

## 2013-01-25 ENCOUNTER — Telehealth: Payer: Self-pay | Admitting: Family Medicine

## 2013-02-01 NOTE — Telephone Encounter (Signed)
PT  NOTIFIED NO SAMPLES AVAILABLE. 

## 2013-02-11 ENCOUNTER — Encounter (INDEPENDENT_AMBULATORY_CARE_PROVIDER_SITE_OTHER): Payer: Self-pay

## 2013-02-11 ENCOUNTER — Ambulatory Visit (INDEPENDENT_AMBULATORY_CARE_PROVIDER_SITE_OTHER): Payer: Medicare Other | Admitting: Nurse Practitioner

## 2013-02-11 VITALS — BP 149/94 | HR 104 | Temp 97.8°F | Ht 61.0 in | Wt 128.0 lb

## 2013-02-11 DIAGNOSIS — J209 Acute bronchitis, unspecified: Secondary | ICD-10-CM | POA: Diagnosis not present

## 2013-02-11 MED ORDER — HYDROCODONE-HOMATROPINE 5-1.5 MG/5ML PO SYRP: 5.0000 mL | ORAL_SOLUTION | Freq: Three times a day (TID) | ORAL | Status: DC | PRN

## 2013-02-11 MED ORDER — AZITHROMYCIN 250 MG PO TABS: ORAL_TABLET | ORAL | Status: DC

## 2013-02-11 NOTE — Patient Instructions (Signed)

## 2013-02-11 NOTE — Progress Notes (Signed)
  Subjective:    Patient ID: Eric Stuart, male    DOB: [birth year], 56 y.o.   MRN: [id number]  HPI Patient in today c/o cough and congestion that started about over 1 week- has triied OTC cough meds with no help.    Review of Systems  Constitutional: Positive for appetite change and fatigue. Negative for fever and chills.  HENT: Positive for congestion, postnasal drip, rhinorrhea and sinus pressure. Negative for ear pain, sore throat and trouble swallowing.   Respiratory: Positive for cough.   Cardiovascular: Negative.   Gastrointestinal: Negative.        Objective:   Physical Exam  Constitutional: He appears well-developed and well-nourished.  HENT:  Right Ear: External ear normal.  Left Ear: External ear normal.  Nose: Nose normal.  Mouth/Throat: Oropharynx is clear and moist.  Eyes: EOM are normal. Pupils are equal, round, and reactive to light.  Neck: Normal range of motion. Neck supple.  Cardiovascular: Normal rate and normal heart sounds.   Pulmonary/Chest: Effort normal. He has wheezes (exp bil lower lobes).  Deep tight cough  Abdominal: Soft. Bowel sounds are normal.  Lymphadenopathy:    He has no cervical adenopathy.  Skin: Skin is warm.  Psychiatric: He has a normal mood and affect. His behavior is normal. Judgment and thought content normal.    BP 149/94  Pulse 104  Temp(Src) 97.8 F (36.6 C) (Oral)  Ht 5\' 1"  (1.549 m)  Wt 128 lb (58.06 kg)  BMI 24.20 kg/m2       Assessment & Plan:   1. Acute bronchitis    Meds ordered this encounter  Medications  . azithromycin (ZITHROMAX) 250 MG tablet    Sig: As directed    Dispense:  6 each    Refill:  0    Order Specific Question:  Supervising Provider    Answer:  Ernestina Penna [[id number]]  . HYDROcodone-homatropine (HYCODAN) 5-1.5 MG/5ML syrup    Sig: Take 5 mLs by mouth every 8 (eight) hours as needed for cough.    Dispense:  120 mL    Refill:  0    Order Specific Question:  Supervising Provider   Answer:  Ernestina Penna [[id number]]   1. Take meds as prescribed 2. Use a cool mist humidifier especially during the winter months and when heat has  been humid. 3. Use saline nose sprays frequently 4. Saline irrigations of the nose can be very helpful if done frequently.  * 4X daily for 1 week*  * Use of a nettie pot can be helpful with this. Follow directions with this* 5. Drink plenty of fluids 6. Keep thermostat turn down low 7.For any cough or congestion  Use plain Mucinex- regular strength or max strength is fine   * Children- consult with Pharmacist for dosing 8. For fever or aces or pains- take tylenol or ibuprofen appropriate for age and weight.  * for fevers greater than 101 orally you may alternate ibuprofen and tylenol every  3 hours.   Mary-Margaret Daphine Deutscher, FNP

## 2013-03-01 ENCOUNTER — Ambulatory Visit (INDEPENDENT_AMBULATORY_CARE_PROVIDER_SITE_OTHER): Payer: Medicare Other | Admitting: Family Medicine

## 2013-03-01 ENCOUNTER — Encounter: Payer: Self-pay | Admitting: Family Medicine

## 2013-03-01 VITALS — BP 126/85 | HR 86 | Temp 97.0°F | Ht 61.0 in | Wt 126.2 lb

## 2013-03-01 DIAGNOSIS — E119 Type 2 diabetes mellitus without complications: Secondary | ICD-10-CM | POA: Diagnosis not present

## 2013-03-01 DIAGNOSIS — I10 Essential (primary) hypertension: Secondary | ICD-10-CM | POA: Diagnosis not present

## 2013-03-01 DIAGNOSIS — Z23 Encounter for immunization: Secondary | ICD-10-CM | POA: Diagnosis not present

## 2013-03-01 DIAGNOSIS — E785 Hyperlipidemia, unspecified: Secondary | ICD-10-CM

## 2013-03-01 DIAGNOSIS — Z0000 Encounter for general adult medical examination without abnormal findings: Secondary | ICD-10-CM | POA: Diagnosis not present

## 2013-03-01 DIAGNOSIS — R350 Frequency of micturition: Secondary | ICD-10-CM

## 2013-03-01 LAB — POCT GLYCOSYLATED HEMOGLOBIN (HGB A1C): Hemoglobin A1C: 6.7

## 2013-03-01 NOTE — Addendum Note (Signed)
Addended by: Orma Render F on: 03/01/2013 01:58 PM   Modules accepted: Orders

## 2013-03-01 NOTE — Progress Notes (Signed)
   Subjective:    Patient ID: Jourdyn Ferrin, male    DOB: [birth year], 56 y.o.   MRN: [id number]  HPI This 57 y.o. male presents for evaluation of Follow up on hypertension, hyperlipidemia And diabetes.  He did not take his bp medicine this am.   Review of Systems No chest pain, SOB, HA, dizziness, vision change, N/V, diarrhea, constipation, dysuria, urinary urgency or frequency, myalgias, arthralgias or rash.     Objective:   Physical Exam Vital signs noted  Well developed well nourished male.  HEENT - Head atraumatic Normocephalic                Eyes - PERRLA, Conjuctiva - clear Sclera- Clear EOMI                Ears - EAC's Wnl TM's Wnl Gross Hearing WNL                Nose - Nares patent                 Throat - oropharanx wnl Respiratory - Lungs CTA bilateral Cardiac - RRR S1 and S2 without murmur GI - Abdomen soft Nontender and bowel sounds active x 4 Extremities - No edema. Neuro - Grossly intact.       Assessment & Plan:  Diabetes - Plan: POCT CBC, CMP14+EGFR, Lipid panel, POCT glycosylated hemoglobin (Hb A1C), PSA, total and free  Other and unspecified hyperlipidemia - Plan: POCT CBC, CMP14+EGFR, Lipid panel, POCT glycosylated hemoglobin (Hb A1C), PSA, total and free  Essential hypertension, benign - Plan: POCT CBC, CMP14+EGFR, Lipid panel, POCT glycosylated hemoglobin (Hb A1C), PSA, total and free  Urinary frequency - Plan: PSA, total and free  Need for vaccination - Plan: Tdap vaccine greater than or equal to 7yo IM, Flu Vaccine QUAD 36+ mos PF IM (Fluarix)  Deatra Canter FNP

## 2013-03-01 NOTE — Patient Instructions (Addendum)
Hypertension As your heart beats, it forces blood through your arteries. This force is your blood pressure. If the pressure is too high, it is called hypertension (HTN) or high blood pressure. HTN is dangerous because you may have it and not know it. High blood pressure may mean that your heart has to work harder to pump blood. Your arteries may be narrow or stiff. The extra work puts you at risk for heart disease, stroke, and other problems.  Blood pressure consists of two numbers, a higher number over a lower, 110/72, for example. It is stated as "110 over 72." The ideal is below 120 for the top number (systolic) and under 80 for the bottom (diastolic). Write down your blood pressure today. You should pay close attention to your blood pressure if you have certain conditions such as:  Heart failure.  Prior heart attack.  Diabetes  Chronic kidney disease.  Prior stroke.  Multiple risk factors for heart disease. To see if you have HTN, your blood pressure should be measured while you are seated with your arm held at the level of the heart. It should be measured at least twice. A one-time elevated blood pressure reading (especially in the Emergency Department) does not mean that you need treatment. There may be conditions in which the blood pressure is different between your right and left arms. It is important to see your caregiver soon for a recheck. Most people have essential hypertension which means that there is not a specific cause. This type of high blood pressure may be lowered by changing lifestyle factors such as:  Stress.  Smoking.  Lack of exercise.  Excessive weight.  Drug/tobacco/alcohol use.  Eating less salt. Most people do not have symptoms from high blood pressure until it has caused damage to the body. Effective treatment can often prevent, delay or reduce that damage. TREATMENT  When a cause has been identified, treatment for high blood pressure is directed at the  cause. There are a large number of medications to treat HTN. These fall into several categories, and your caregiver will help you select the medicines that are best for you. Medications may have side effects. You should review side effects with your caregiver. If your blood pressure stays high after you have made lifestyle changes or started on medicines,   Your medication(s) may need to be changed.  Other problems may need to be addressed.  Be certain you understand your prescriptions, and know how and when to take your medicine.  Be sure to follow up with your caregiver within the time frame advised (usually within two weeks) to have your blood pressure rechecked and to review your medications.  If you are taking more than one medicine to lower your blood pressure, make sure you know how and at what times they should be taken. Taking two medicines at the same time can result in blood pressure that is too low. SEEK IMMEDIATE MEDICAL CARE IF:  You develop a severe headache, blurred or changing vision, or confusion.  You have unusual weakness or numbness, or a faint feeling.  You have severe chest or abdominal pain, vomiting, or breathing problems. MAKE SURE YOU:   Understand these instructions.  Will watch your condition.  Will get help right away if you are not doing well or get worse. Document Released: 03/18/2005 Document Revised: 06/10/2011 Document Reviewed: 11/06/2007 [facility] [place] Patient Information 2014 [place], [place]. Influenza Virus Vaccine injection (Fluarix) What is this medicine? INFLUENZA VIRUS VACCINE (in floo EN zuh  VAHY ruhs vak SEEN) helps to reduce the risk of getting influenza also known as the flu. This medicine may be used for other purposes; ask your health care provider or pharmacist if you have questions. COMMON BRAND NAME(S): Fluarix, Fluzone What should I tell my health care provider before I take this medicine? They need to know if you have any of these  conditions: -bleeding disorder like hemophilia -fever or infection -Guillain-Barre syndrome or other neurological problems -immune system problems -infection with the human immunodeficiency virus (HIV) or AIDS -low blood platelet counts -multiple sclerosis -an unusual or allergic reaction to influenza virus vaccine, eggs, chicken proteins, latex, gentamicin, other medicines, foods, dyes or preservatives -pregnant or trying to get pregnant -breast-feeding How should I use this medicine? This vaccine is for injection into a muscle. It is given by a health care professional. A copy of Vaccine Information Statements will be given before each vaccination. Read this sheet carefully each time. The sheet may change frequently. Talk to your pediatrician regarding the use of this medicine in children. Special care may be needed. Overdosage: If you think you have taken too much of this medicine contact a poison control center or emergency room at once. NOTE: This medicine is only for you. Do not share this medicine with others. What if I miss a dose? This does not apply. What may interact with this medicine? -chemotherapy or radiation therapy -medicines that lower your immune system like etanercept, anakinra, infliximab, and adalimumab -medicines that treat or prevent blood clots like warfarin -phenytoin -steroid medicines like prednisone or cortisone -theophylline -vaccines This list may not describe all possible interactions. Give your health care provider a list of all the medicines, herbs, non-prescription drugs, or dietary supplements you use. Also tell them if you smoke, drink alcohol, or use illegal drugs. Some items may interact with your medicine. What should I watch for while using this medicine? Report any side effects that do not go away within 3 days to your doctor or health care professional. Call your health care provider if any unusual symptoms occur within 6 weeks of receiving this  vaccine. You may still catch the flu, but the illness is not usually as bad. You cannot get the flu from the vaccine. The vaccine will not protect against colds or other illnesses that may cause fever. The vaccine is needed every year. What side effects may I notice from receiving this medicine? Side effects that you should report to your doctor or health care professional as soon as possible: -allergic reactions like skin rash, itching or hives, swelling of the face, lips, or tongue Side effects that usually do not require medical attention (report to your doctor or health care professional if they continue or are bothersome): -fever -headache -muscle aches and pains -pain, tenderness, redness, or swelling at site where injected -weak or tired This list may not describe all possible side effects. Call your doctor for medical advice about side effects. You may report side effects to FDA at 1-800-FDA-1088. Where should I keep my medicine? This vaccine is only given in a clinic, pharmacy, doctor's office, or other health care setting and will not be stored at home. NOTE: This sheet is a summary. It may not cover all possible information. If you have questions about this medicine, talk to your doctor, pharmacist, or health care provider.  2014, [employer]vier/[employer] Standard. (2007-10-14 09:30:40) Tetanus, Diphtheria, Pertussis (Tdap) Vaccine What You Need to Know WHY GET VACCINATED? Tetanus, diphtheria and pertussis can be very  serious diseases, even for adolescents and adults. Tdap vaccine can protect [place] from these diseases. TETANUS (Lockjaw) causes painful muscle tightening and stiffness, usually all over the body.  It can lead to tightening of muscles in the head and neck so you can't open your mouth, swallow, or sometimes even breathe. Tetanus kills about 1 out of 5 people who are infected. DIPHTHERIA can cause a thick coating to form in the back of the throat.  It can lead to breathing problems,  paralysis, heart failure, and death. PERTUSSIS (Whooping Cough) causes severe coughing spells, which can cause difficulty breathing, vomiting and disturbed sleep.  It can also lead to weight loss, incontinence, and rib fractures. Up to 2 in 100 adolescents and 5 in 100 adults with pertussis are hospitalized or have complications, which could include pneumonia and death. These diseases are caused by bacteria. Diphtheria and pertussis are spread from person to person through coughing or sneezing. Tetanus enters the body through cuts, scratches, or wounds. Before vaccines, the [place] saw as many as 200,000 cases a year of diphtheria and pertussis, and hundreds of cases of tetanus. Since vaccination began, tetanus and diphtheria have dropped by about 99% and pertussis by about 80%. TDAP VACCINE Tdap vaccine can protect adolescents and adults from tetanus, diphtheria, and pertussis. One dose of Tdap is routinely given at age 33 or 41. People who did not get Tdap at that age should get it as soon as possible. Tdap is especially important for health care professionals and anyone having close contact with a baby younger than 12 months. Pregnant women should get a dose of Tdap during every pregnancy, to protect the newborn from pertussis. Infants are most at risk for severe, life-threatening complications from pertussis. A similar vaccine, called Td, protects from tetanus and diphtheria, but not pertussis. A Td booster should be given every 10 years. Tdap may be given as one of these boosters if you have not already gotten a dose. Tdap may also be given after a severe cut or burn to prevent tetanus infection. Your doctor can give you more information. Tdap may safely be given at the same time as other vaccines. SOME PEOPLE SHOULD NOT GET THIS VACCINE  If you ever had a life-threatening allergic reaction after a dose of any tetanus, diphtheria, or pertussis containing vaccine, OR if you have a severe  allergy to any part of this vaccine, you should not get Tdap. Tell your doctor if you have any severe allergies.  If you had a coma, or long or multiple seizures within 7 days after a childhood dose of DTP or DTaP, you should not get Tdap, unless a cause other than the vaccine was found. You can still get Td.  Talk to your doctor if you:  have epilepsy or another nervous system problem,  had severe pain or swelling after any vaccine containing diphtheria, tetanus or pertussis,  ever had Guillain-Barr Syndrome (GBS),  aren't feeling well on the day the shot is scheduled. RISKS OF A VACCINE REACTION With any medicine, including vaccines, there is a chance of side effects. These are usually mild and go away on their own, but serious reactions are also possible. Brief fainting spells can follow a vaccination, leading to injuries from falling. Sitting or lying down for about 15 minutes can help prevent these. Tell your doctor if you feel dizzy or light-headed, or have vision changes or ringing in the ears. Mild problems following Tdap (Did not interfere with activities)  Pain  where the shot was given (about 3 in 4 adolescents or 2 in 3 adults)  Redness or swelling where the shot was given (about 1 person in 5)  Mild fever of at least 100.23F (up to about 1 in 25 adolescents or 1 in 100 adults)  Headache (about 3 or 4 people in 10)  Tiredness (about 1 person in 3 or 4)  Nausea, vomiting, diarrhea, stomach ache (up to 1 in 4 adolescents or 1 in 10 adults)  Chills, body aches, sore joints, rash, swollen glands (uncommon) Moderate problems following Tdap (Interfered with activities, but did not require medical attention)  Pain where the shot was given (about 1 in 5 adolescents or 1 in 100 adults)  Redness or swelling where the shot was given (up to about 1 in 16 adolescents or 1 in 25 adults)  Fever over 102F (about 1 in 100 adolescents or 1 in 250 adults)  Headache (about 3 in 20  adolescents or 1 in 10 adults)  Nausea, vomiting, diarrhea, stomach ache (up to 1 or 3 people in 100)  Swelling of the entire arm where the shot was given (up to about 3 in 100). Severe problems following Tdap (Unable to perform usual activities, required medical attention)  Swelling, severe pain, bleeding and redness in the arm where the shot was given (rare). A severe allergic reaction could occur after any vaccine (estimated less than 1 in a million doses). WHAT IF THERE IS A SERIOUS REACTION? What should I look for?  Look for anything that concerns you, such as signs of a severe allergic reaction, very high fever, or behavior changes. Signs of a severe allergic reaction can include hives, swelling of the face and throat, difficulty breathing, a fast heartbeat, dizziness, and weakness. These would start a few minutes to a few hours after the vaccination. What should I do?  If you think it is a severe allergic reaction or other emergency that can't wait, call 9-1-1 or get the person to the nearest hospital. Otherwise, call your doctor.  Afterward, the reaction should be reported to the "Vaccine Adverse Event Reporting System" (VAERS). Your doctor might file this report, or you can do it yourself through the VAERS web site at www.vaers.LAgents.no, or by calling 1-575-533-8197. VAERS is only for reporting reactions. They do not give medical advice.  THE NATIONAL VACCINE INJURY COMPENSATION PROGRAM The National Vaccine Injury Compensation Program (VICP) is a federal program that was created to compensate people who may have been injured by certain vaccines. Persons who believe they may have been injured by a vaccine can learn about the program and about filing a claim by calling 1-707-343-6200 or visiting the VICP website at SpiritualWord.at. HOW CAN I LEARN MORE?  Ask your doctor.  Call your local or state health department.  Contact the Centers for Disease Control and  Prevention (CDC):  Call 912-139-3007 or visit CDC's website at PicCapture.uy. CDC Tdap Vaccine VIS (08/08/11) Document Released: 09/17/2011 Document Revised: 07/13/2012 Document Reviewed: 07/08/2012 [facility] Patient Information 2014 [place], [place].

## 2013-03-02 ENCOUNTER — Telehealth: Payer: Self-pay | Admitting: Family Medicine

## 2013-03-02 ENCOUNTER — Ambulatory Visit: Payer: Medicare Other | Admitting: *Deleted

## 2013-03-02 LAB — LIPID PANEL
Chol/HDL Ratio: 2.1 ratio units (ref 0.0–5.0)
Cholesterol, Total: 135 mg/dL (ref 100–199)
HDL: 63 mg/dL (ref >39)
LDL Calculated: 46 mg/dL (ref 0–99)
Triglycerides: 128 mg/dL (ref 0–149)
VLDL Cholesterol Cal: 26 mg/dL (ref 5–40)

## 2013-03-02 LAB — CBC WITH DIFFERENTIAL
Basophils Absolute: 0 10*3/uL (ref 0.0–0.2)
Basos: 0 %
Eos: 5 %
Eosinophils Absolute: 0.4 10*3/uL (ref 0.0–0.4)
HCT: 35.4 % — ABNORMAL LOW (ref 37.5–51.0)
Hemoglobin: 11.8 g/dL — ABNORMAL LOW (ref 12.6–17.7)
Immature Grans (Abs): 0 10*3/uL (ref 0.0–0.1)
Immature Granulocytes: 0 %
Lymphocytes Absolute: 1.4 10*3/uL (ref 0.7–3.1)
Lymphs: 15 %
MCH: 27.3 pg (ref 26.6–33.0)
MCHC: 33.3 g/dL (ref 31.5–35.7)
MCV: 82 fL (ref 79–97)
Monocytes Absolute: 0.9 10*3/uL (ref 0.1–0.9)
Monocytes: 10 %
Neutrophils Absolute: 6.6 10*3/uL (ref 1.4–7.0)
Neutrophils Relative %: 70 %
Platelets: 270 10*3/uL (ref 150–379)
RBC: 4.33 x10E6/uL (ref 4.14–5.80)
RDW: 17.7 % — ABNORMAL HIGH (ref 12.3–15.4)
WBC: 9.3 10*3/uL (ref 3.4–10.8)

## 2013-03-02 LAB — PSA, TOTAL AND FREE
PSA, Free Pct: 27.8 %
PSA, Free: 0.25 ng/mL
PSA: 0.9 ng/mL (ref 0.0–4.0)

## 2013-03-02 LAB — CMP14+EGFR
ALT: 6 IU/L (ref 0–44)
AST: 17 IU/L (ref 0–40)
Albumin/Globulin Ratio: 1.9 (ref 1.1–2.5)
Albumin: 4.4 g/dL (ref 3.5–5.5)
Alkaline Phosphatase: 67 IU/L (ref 39–117)
BUN/Creatinine Ratio: 18 (ref 9–20)
BUN: 18 mg/dL (ref 6–24)
CO2: 23 mmol/L (ref 18–29)
Calcium: 9.5 mg/dL (ref 8.7–10.2)
Chloride: 104 mmol/L (ref 97–108)
Creatinine, Ser: 1.01 mg/dL (ref 0.76–1.27)
GFR calc Af Amer: 96 mL/min/{1.73_m2} (ref >59)
GFR calc non Af Amer: 83 mL/min/{1.73_m2} (ref >59)
Globulin, Total: 2.3 g/dL (ref 1.5–4.5)
Glucose: 126 mg/dL — ABNORMAL HIGH (ref 65–99)
Potassium: 4 mmol/L (ref 3.5–5.2)
Sodium: 143 mmol/L (ref 134–144)
Total Bilirubin: 0.5 mg/dL (ref 0.0–1.2)
Total Protein: 6.7 g/dL (ref 6.0–8.5)

## 2013-03-02 NOTE — Telephone Encounter (Signed)
lmtcb on labs  

## 2013-03-02 NOTE — Telephone Encounter (Signed)
Message copied by Azalee Course on Tue Mar 02, 2013 10:46 AM ------      Message from: Deatra Canter      Created: Tue Mar 02, 2013  8:57 AM       Labs look good ------

## 2013-03-02 NOTE — Progress Notes (Signed)
Patient ID: Eric Stuart, male   DOB: Feb 18, 1956, 57 y.o.   MRN: [id number] Pt came in with c/o L arm swelling after receiving Tdap yesterday L arm examined showing slight swelling, but not painful per pt Reassured pt that this is common after receiving Tdap

## 2013-03-15 ENCOUNTER — Encounter: Payer: Self-pay | Admitting: Gastroenterology

## 2013-03-24 ENCOUNTER — Other Ambulatory Visit: Payer: Self-pay | Admitting: Family Medicine

## 2013-03-30 ENCOUNTER — Telehealth: Payer: Self-pay | Admitting: Family Medicine

## 2013-04-06 ENCOUNTER — Encounter: Payer: Self-pay | Admitting: *Deleted

## 2013-08-25 ENCOUNTER — Ambulatory Visit (INDEPENDENT_AMBULATORY_CARE_PROVIDER_SITE_OTHER): Payer: Medicare Other | Admitting: Physician Assistant

## 2013-08-25 ENCOUNTER — Other Ambulatory Visit: Payer: Self-pay | Admitting: Family Medicine

## 2013-08-25 VITALS — BP 145/89 | HR 54 | Temp 98.1°F | Ht 61.0 in | Wt 121.6 lb

## 2013-08-25 DIAGNOSIS — L97509 Non-pressure chronic ulcer of other part of unspecified foot with unspecified severity: Secondary | ICD-10-CM

## 2013-08-25 DIAGNOSIS — L97529 Non-pressure chronic ulcer of other part of left foot with unspecified severity: Secondary | ICD-10-CM

## 2013-08-25 MED ORDER — CEPHALEXIN 500 MG PO CAPS: 500.0000 mg | ORAL_CAPSULE | Freq: Two times a day (BID) | ORAL | Status: DC

## 2013-08-25 NOTE — Progress Notes (Signed)
Subjective:     Patient ID: Eric Stuart, male   DOB: [birth year], 58 y.o.   MRN: [id number]  HPI Pt with a sore in between the 4th-5th toes of the L foot Pt is diabetic States BS have been doing well  Review of Systems No pain or drainage from the sites No swelling to the foot No OTC meds tried    Objective:   Physical Exam + ulcer noted to the medial aspect of the 5th digit L foot No surrounding edema Sl maceration noted Good granulation tissue No erythema or edema to the foot Good pulses    Assessment:     Ulcer L foot    Plan:     Would like him to keep the area clean and dry Continue with nightly foot checks Keflex 500mg  1 po bid # 20 F/U in 2 weeks

## 2013-08-25 NOTE — Patient Instructions (Signed)
Cellulitis Cellulitis is an infection of the skin and the tissue beneath it. The infected area is usually red and tender. Cellulitis occurs most often in the arms and lower legs.  CAUSES  Cellulitis is caused by bacteria that enter the skin through cracks or cuts in the skin. The most common types of bacteria that cause cellulitis are Staphylococcus and Streptococcus. SYMPTOMS   Redness and warmth.  Swelling.  Tenderness or pain.  Fever. DIAGNOSIS  Your caregiver can usually determine what is wrong based on a physical exam. Blood tests may also be done. TREATMENT  Treatment usually involves taking an antibiotic medicine. HOME CARE INSTRUCTIONS   Take your antibiotics as directed. Finish them even if you start to feel better.  Keep the infected arm or leg elevated to reduce swelling.  Apply a warm cloth to the affected area up to 4 times per day to relieve pain.  Only take over-the-counter or prescription medicines for pain, discomfort, or fever as directed by your caregiver.  Keep all follow-up appointments as directed by your caregiver. SEEK MEDICAL CARE IF:   You notice red streaks coming from the infected area.  Your red area gets larger or turns dark in color.  Your bone or joint underneath the infected area becomes painful after the skin has healed.  Your infection returns in the same area or another area.  You notice a swollen bump in the infected area.  You develop new symptoms. SEEK IMMEDIATE MEDICAL CARE IF:   You have a fever.  You feel very sleepy.  You develop vomiting or diarrhea.  You have a general ill feeling (malaise) with muscle aches and pains. MAKE SURE YOU:   Understand these instructions.  Will watch your condition.  Will get help right away if you are not doing well or get worse. Document Released: 12/26/2004 Document Revised: 09/17/2011 Document Reviewed: 06/03/2011 [employer] Patient Information 2014 [employer].  

## 2013-09-02 ENCOUNTER — Other Ambulatory Visit: Payer: Self-pay | Admitting: Physician Assistant

## 2013-09-02 ENCOUNTER — Encounter: Payer: Self-pay | Admitting: Gastroenterology

## 2013-09-03 NOTE — Telephone Encounter (Signed)
Patient saw Eric Stuart on 08-25-13 for a foot ulcer. Was rxd this ABT. Requesting a refill. Please advise. Does have a follow up appt with Eric Stuart on 09-10-13.

## 2013-09-10 ENCOUNTER — Ambulatory Visit (INDEPENDENT_AMBULATORY_CARE_PROVIDER_SITE_OTHER): Payer: Medicare Other | Admitting: Physician Assistant

## 2013-09-10 ENCOUNTER — Encounter: Payer: Self-pay | Admitting: Physician Assistant

## 2013-09-10 VITALS — BP 127/81 | HR 65 | Temp 98.0°F | Ht 61.0 in | Wt 120.2 lb

## 2013-09-10 DIAGNOSIS — L97509 Non-pressure chronic ulcer of other part of unspecified foot with unspecified severity: Secondary | ICD-10-CM | POA: Diagnosis not present

## 2013-09-10 NOTE — Patient Instructions (Signed)
Cellulitis Cellulitis is an infection of the skin and the tissue beneath it. The infected area is usually red and tender. Cellulitis occurs most often in the arms and lower legs.  CAUSES  Cellulitis is caused by bacteria that enter the skin through cracks or cuts in the skin. The most common types of bacteria that cause cellulitis are Staphylococcus and Streptococcus. SYMPTOMS   Redness and warmth.  Swelling.  Tenderness or pain.  Fever. DIAGNOSIS  Your caregiver can usually determine what is wrong based on a physical exam. Blood tests may also be done. TREATMENT  Treatment usually involves taking an antibiotic medicine. HOME CARE INSTRUCTIONS   Take your antibiotics as directed. Finish them even if you start to feel better.  Keep the infected arm or leg elevated to reduce swelling.  Apply a warm cloth to the affected area up to 4 times per day to relieve pain.  Only take over-the-counter or prescription medicines for pain, discomfort, or fever as directed by your caregiver.  Keep all follow-up appointments as directed by your caregiver. SEEK MEDICAL CARE IF:   You notice red streaks coming from the infected area.  Your red area gets larger or turns dark in color.  Your bone or joint underneath the infected area becomes painful after the skin has healed.  Your infection returns in the same area or another area.  You notice a swollen bump in the infected area.  You develop new symptoms. SEEK IMMEDIATE MEDICAL CARE IF:   You have a fever.  You feel very sleepy.  You develop vomiting or diarrhea.  You have a general ill feeling (malaise) with muscle aches and pains. MAKE SURE YOU:   Understand these instructions.  Will watch your condition.  Will get help right away if you are not doing well or get worse. Document Released: 12/26/2004 Document Revised: 09/17/2011 Document Reviewed: 06/03/2011 [employer] Patient Information 2014 [employer].  

## 2013-09-10 NOTE — Progress Notes (Signed)
Subjective:     Patient ID: Eric Stuart, male   DOB: Aug 11, 1955, 58 y.o.   MRN: [id number]  HPI Pt here for f/u of ulcer to the L foot  Review of Systems Denies pain or swelling to the foot No drainage from the area He has taken full course of ATB    Objective:   Physical Exam Ulcer has granulated in No surrounding edema, erythema, or induration FROM toes w/o sx Good pulses/sensory    Assessment:     Ulcer of foot      Plan:     Continue with nightly foot checks Keep the foot clean and dry Keep his regular f/u for chronic health conditions

## 2013-09-22 ENCOUNTER — Other Ambulatory Visit: Payer: Self-pay | Admitting: Family Medicine

## 2013-10-25 ENCOUNTER — Other Ambulatory Visit: Payer: Self-pay

## 2013-10-25 MED ORDER — METFORMIN HCL 1000 MG PO TABS: 1000.0000 mg | ORAL_TABLET | Freq: Two times a day (BID) | ORAL | Status: DC

## 2013-10-25 NOTE — Telephone Encounter (Signed)
no more refills without being seen  

## 2013-10-25 NOTE — Telephone Encounter (Signed)
Last seen 09/10/13  [facility]  Last glucose 03/01/13

## 2013-11-19 ENCOUNTER — Other Ambulatory Visit: Payer: Self-pay | Admitting: Family Medicine

## 2013-11-22 NOTE — Telephone Encounter (Signed)
Last lipids 12/14.

## 2013-12-08 ENCOUNTER — Other Ambulatory Visit: Payer: Self-pay | Admitting: *Deleted

## 2013-12-08 MED ORDER — ROSUVASTATIN CALCIUM 10 MG PO TABS: 10.0000 mg | ORAL_TABLET | Freq: Every day | ORAL | Status: DC

## 2013-12-08 NOTE — Telephone Encounter (Signed)
Last lipids 12/14.

## 2013-12-29 ENCOUNTER — Ambulatory Visit (INDEPENDENT_AMBULATORY_CARE_PROVIDER_SITE_OTHER): Payer: Medicare Other

## 2013-12-29 DIAGNOSIS — Z23 Encounter for immunization: Secondary | ICD-10-CM | POA: Diagnosis not present

## 2014-01-22 ENCOUNTER — Other Ambulatory Visit: Payer: Self-pay | Admitting: Nurse Practitioner

## 2014-01-24 NOTE — Telephone Encounter (Signed)
no more refills without being seen  

## 2014-01-24 NOTE — Telephone Encounter (Signed)
Last seen 09/10/13 [facility]  Last glucose 03/01/13

## 2014-03-05 ENCOUNTER — Other Ambulatory Visit: Payer: Self-pay | Admitting: Family Medicine

## 2014-03-07 NOTE — Telephone Encounter (Signed)
Last lipids 12/14. ntbs

## 2014-03-20 ENCOUNTER — Other Ambulatory Visit: Payer: Self-pay | Admitting: Family Medicine

## 2014-03-21 NOTE — Telephone Encounter (Signed)
Pt scheduled for follow up appointment with [name] 04/26/14.

## 2014-04-26 ENCOUNTER — Encounter: Payer: Self-pay | Admitting: Family

## 2014-04-26 ENCOUNTER — Ambulatory Visit (INDEPENDENT_AMBULATORY_CARE_PROVIDER_SITE_OTHER): Payer: Medicare Other | Admitting: Family

## 2014-04-26 VITALS — BP 150/91 | HR 79 | Temp 97.4°F | Ht 61.0 in | Wt 123.0 lb

## 2014-04-26 DIAGNOSIS — Z125 Encounter for screening for malignant neoplasm of prostate: Secondary | ICD-10-CM

## 2014-04-26 DIAGNOSIS — E785 Hyperlipidemia, unspecified: Secondary | ICD-10-CM | POA: Diagnosis not present

## 2014-04-26 DIAGNOSIS — E119 Type 2 diabetes mellitus without complications: Secondary | ICD-10-CM

## 2014-04-26 DIAGNOSIS — I10 Essential (primary) hypertension: Secondary | ICD-10-CM

## 2014-04-26 DIAGNOSIS — E1169 Type 2 diabetes mellitus with other specified complication: Secondary | ICD-10-CM | POA: Insufficient documentation

## 2014-04-26 DIAGNOSIS — Z23 Encounter for immunization: Secondary | ICD-10-CM

## 2014-04-26 DIAGNOSIS — E559 Vitamin D deficiency, unspecified: Secondary | ICD-10-CM

## 2014-04-26 LAB — POCT UA - MICROALBUMIN: Microalbumin Ur, POC: 20 mg/L

## 2014-04-26 LAB — POCT GLYCOSYLATED HEMOGLOBIN (HGB A1C): Hemoglobin A1C: 6.3

## 2014-04-26 MED ORDER — ROSUVASTATIN CALCIUM 10 MG PO TABS: 10.0000 mg | ORAL_TABLET | Freq: Every day | ORAL | Status: DC

## 2014-04-26 MED ORDER — OLMESARTAN-AMLODIPINE-HCTZ 40-10-25 MG PO TABS: 1.0000 | ORAL_TABLET | Freq: Every day | ORAL | Status: DC

## 2014-04-26 MED ORDER — METFORMIN HCL 1000 MG PO TABS: ORAL_TABLET | ORAL | Status: DC

## 2014-04-26 NOTE — Addendum Note (Signed)
Addended by: Earlene Plater on: 04/26/2014 05:55 PM   Modules accepted: Orders

## 2014-04-26 NOTE — Addendum Note (Signed)
Addended by: Shelbie Ammons on: 04/26/2014 05:55 PM   Modules accepted: Orders

## 2014-04-26 NOTE — Patient Instructions (Signed)
DASH Eating Plan DASH stands for "Dietary Approaches to Stop Hypertension." The DASH eating plan is a healthy eating plan that has been shown to reduce high blood pressure (hypertension). Additional health benefits may include reducing the risk of type 2 diabetes mellitus, heart disease, and stroke. The DASH eating plan may also help with weight loss. WHAT DO I NEED TO KNOW ABOUT THE DASH EATING PLAN? For the DASH eating plan, you will follow these general guidelines:  Choose foods with a percent daily value for sodium of less than 5% (as listed on the food label).  Use salt-free seasonings or herbs instead of table salt or sea salt.  Check with your health care provider or pharmacist before using salt substitutes.  Eat lower-sodium products, often labeled as "lower sodium" or "no salt added."  Eat fresh foods.  Eat more vegetables, fruits, and low-fat dairy products.  Choose whole grains. Look for the word "whole" as the first word in the ingredient list.  Choose fish and skinless chicken or turkey more often than red meat. Limit fish, poultry, and meat to 6 oz (170 g) each day.  Limit sweets, desserts, sugars, and sugary drinks.  Choose heart-healthy fats.  Limit cheese to 1 oz (28 g) per day.  Eat more home-cooked food and less restaurant, buffet, and fast food.  Limit fried foods.  Cook foods using methods other than frying.  Limit canned vegetables. If you do use them, rinse them well to decrease the sodium.  When eating at a restaurant, ask that your food be prepared with less salt, or no salt if possible. WHAT FOODS CAN I EAT? Seek help from a dietitian for individual calorie needs. Grains Whole grain or whole wheat bread. Brown rice. Whole grain or whole wheat pasta. Quinoa, bulgur, and whole grain cereals. Low-sodium cereals. Corn or whole wheat flour tortillas. Whole grain cornbread. Whole grain crackers. Low-sodium crackers. Vegetables Fresh or frozen vegetables  (raw, steamed, roasted, or grilled). Low-sodium or reduced-sodium tomato and vegetable juices. Low-sodium or reduced-sodium tomato sauce and paste. Low-sodium or reduced-sodium canned vegetables.  Fruits All fresh, canned (in natural juice), or frozen fruits. Meat and Other Protein Products Ground beef (85% or leaner), grass-fed beef, or beef trimmed of fat. Skinless chicken or turkey. Ground chicken or turkey. Pork trimmed of fat. All fish and seafood. Eggs. Dried beans, peas, or lentils. Unsalted nuts and seeds. Unsalted canned beans. Dairy Low-fat dairy products, such as skim or 1% milk, 2% or reduced-fat cheeses, low-fat ricotta or cottage cheese, or plain low-fat yogurt. Low-sodium or reduced-sodium cheeses. Fats and Oils Tub margarines without trans fats. Light or reduced-fat mayonnaise and salad dressings (reduced sodium). Avocado. Safflower, olive, or canola oils. Natural peanut or almond butter. Other Unsalted popcorn and pretzels. The items listed above may not be a complete list of recommended foods or beverages. Contact your dietitian for more options. WHAT FOODS ARE NOT RECOMMENDED? Grains White bread. White pasta. White rice. Refined cornbread. Bagels and croissants. Crackers that contain trans fat. Vegetables Creamed or fried vegetables. Vegetables in a cheese sauce. Regular canned vegetables. Regular canned tomato sauce and paste. Regular tomato and vegetable juices. Fruits Dried fruits. Canned fruit in light or heavy syrup. Fruit juice. Meat and Other Protein Products Fatty cuts of meat. Ribs, chicken wings, bacon, sausage, bologna, salami, chitterlings, fatback, hot dogs, bratwurst, and packaged luncheon meats. Salted nuts and seeds. Canned beans with salt. Dairy Whole or 2% milk, cream, half-and-half, and cream cheese. Whole-fat or sweetened yogurt. Full-fat   cheeses or blue cheese. Nondairy creamers and whipped toppings. Processed cheese, cheese spreads, or cheese  curds. Condiments Onion and garlic salt, seasoned salt, table salt, and sea salt. Canned and packaged gravies. Worcestershire sauce. Tartar sauce. Barbecue sauce. Teriyaki sauce. Soy sauce, including reduced sodium. Steak sauce. Fish sauce. Oyster sauce. Cocktail sauce. Horseradish. Ketchup and mustard. Meat flavorings and tenderizers. Bouillon cubes. Hot sauce. Tabasco sauce. Marinades. Taco seasonings. Relishes. Fats and Oils Butter, stick margarine, lard, shortening, ghee, and bacon fat. Coconut, palm kernel, or palm oils. Regular salad dressings. Other Pickles and olives. Salted popcorn and pretzels. The items listed above may not be a complete list of foods and beverages to avoid. Contact your dietitian for more information. WHERE CAN I FIND MORE INFORMATION? National [employer], [employer], [employer]: www.nhlbi.nih.gov/health/health-topics/topics/dash/ Document Released: 03/07/2011 Document Revised: 08/02/2013 Document Reviewed: 01/20/2013 ExitCare Patient Information 2015 [employer]. This information is not intended to replace advice given to you by your health care provider. Make sure you discuss any questions you have with your health care provider. Hypertension Hypertension, commonly called high blood pressure, is when the force of blood pumping through your arteries is too strong. Your arteries are the blood vessels that carry blood from your heart throughout your body. A blood pressure reading consists of a higher number over a lower number, such as 110/72. The higher number (systolic) is the pressure inside your arteries when your heart pumps. The lower number (diastolic) is the pressure inside your arteries when your heart relaxes. Ideally you want your blood pressure below 120/80. Hypertension forces your heart to work harder to pump blood. Your arteries may become narrow or stiff. Having hypertension puts you at risk for heart disease, stroke, and other problems.  RISK  FACTORS Some risk factors for high blood pressure are controllable. Others are not.  Risk factors you cannot control include:   Race. You may be at higher risk if you are African American.  Age. Risk increases with age.  Gender. Men are at higher risk than women before age 45 years. After age 65, women are at higher risk than men. Risk factors you can control include:  Not getting enough exercise or physical activity.  Being overweight.  Getting too much fat, sugar, calories, or salt in your diet.  Drinking too much alcohol. SIGNS AND SYMPTOMS Hypertension does not usually cause signs or symptoms. Extremely high blood pressure (hypertensive crisis) may cause headache, anxiety, shortness of breath, and nosebleed. DIAGNOSIS  To check if you have hypertension, your health care provider will measure your blood pressure while you are seated, with your arm held at the level of your heart. It should be measured at least twice using the same arm. Certain conditions can cause a difference in blood pressure between your right and left arms. A blood pressure reading that is higher than normal on one occasion does not mean that you need treatment. If one blood pressure reading is high, ask your health care provider about having it checked again. TREATMENT  Treating high blood pressure includes making lifestyle changes and possibly taking medicine. Living a healthy lifestyle can help lower high blood pressure. You may need to change some of your habits. Lifestyle changes may include:  Following the DASH diet. This diet is high in fruits, vegetables, and whole grains. It is low in salt, red meat, and added sugars.  Getting at least 2 hours of brisk physical activity every week.  Losing weight if necessary.  Not smoking.  Limiting   alcoholic beverages.  Learning ways to reduce stress. If lifestyle changes are not enough to get your blood pressure under control, your health care provider may  prescribe medicine. You may need to take more than one. Work closely with your health care provider to understand the risks and benefits. HOME CARE INSTRUCTIONS  Have your blood pressure rechecked as directed by your health care provider.   Take medicines only as directed by your health care provider. Follow the directions carefully. Blood pressure medicines must be taken as prescribed. The medicine does not work as well when you skip doses. Skipping doses also puts you at risk for problems.   Do not smoke.   Monitor your blood pressure at home as directed by your health care provider. SEEK MEDICAL CARE IF:   You think you are having a reaction to medicines taken.  You have recurrent headaches or feel dizzy.  You have swelling in your ankles.  You have trouble with your vision. SEEK IMMEDIATE MEDICAL CARE IF:  You develop a severe headache or confusion.  You have unusual weakness, numbness, or feel faint.  You have severe chest or abdominal pain.  You vomit repeatedly.  You have trouble breathing. MAKE SURE YOU:   Understand these instructions.  Will watch your condition.  Will get help right away if you are not doing well or get worse. Document Released: 03/18/2005 Document Revised: 08/02/2013 Document Reviewed: 01/08/2013 ExitCare Patient Information 2015 [employer]. This information is not intended to replace advice given to you by your health care provider. Make sure you discuss any questions you have with your health care provider.  

## 2014-04-26 NOTE — Progress Notes (Signed)
 Subjective:    Patient ID: Eric Stuart, male    DOB: 07/15/1955, 58 y.o.   MRN: [id number]  Diabetes He presents for his follow-up diabetic visit. He has type 2 diabetes mellitus. Pertinent negatives for hypoglycemia include no confusion, dizziness or headaches. Pertinent negatives for diabetes include no blurred vision, no foot paresthesias, no foot ulcerations and no visual change. Pertinent negatives for hypoglycemia complications include no blackouts and no hospitalization. Symptoms are stable. Pertinent negatives for diabetic complications include no CVA, heart disease, nephropathy or peripheral neuropathy. Risk factors for coronary artery disease include diabetes mellitus, dyslipidemia, hypertension and male sex. Current diabetic treatment includes oral agent (monotherapy). He is compliant with treatment all of the time. He is following a generally unhealthy diet. An ACE inhibitor/angiotensin II receptor blocker is being taken. Eye exam is current.  Hyperlipidemia This is a chronic problem. The current episode started more than 1 year ago. The problem is controlled. Recent lipid tests were reviewed and are normal. Exacerbating diseases include diabetes. He has no history of hypothyroidism. Pertinent negatives include no leg pain, myalgias or shortness of breath. Current antihyperlipidemic treatment includes statins. The current treatment provides moderate improvement of lipids. Risk factors for coronary artery disease include diabetes mellitus, dyslipidemia, family history, hypertension and male sex.  Hypertension This is a chronic problem. The current episode started more than 1 year ago. The problem has been waxing and waning since onset. The problem is uncontrolled. Pertinent negatives include no anxiety, blurred vision, headaches, palpitations, peripheral edema or shortness of breath. Past treatments include diuretics, calcium channel blockers and angiotensin blockers. The current treatment  provides mild improvement. There is no history of kidney disease, CAD/MI, CVA, heart failure or a thyroid problem. There is no history of sleep apnea.      Review of Systems  Constitutional: Negative.   HENT: Negative.   Eyes: Negative for blurred vision.  Respiratory: Negative.  Negative for shortness of breath.   Cardiovascular: Negative.  Negative for palpitations.  Gastrointestinal: Negative.   Endocrine: Negative.   Genitourinary: Negative.   Musculoskeletal: Negative.  Negative for myalgias.  Neurological: Negative.  Negative for dizziness and headaches.  Hematological: Negative.   Psychiatric/Behavioral: Negative.  Negative for confusion.  All other systems reviewed and are negative.      Objective:   Physical Exam  Constitutional: He is oriented to person, place, and time. He appears well-developed and well-nourished. No distress.  HENT:  Head: Normocephalic.  Right Ear: External ear normal.  Left Ear: External ear normal.  Nose: Nose normal.  Mouth/Throat: Oropharynx is clear and moist.  Eyes: Pupils are equal, round, and reactive to light. Right eye exhibits no discharge. Left eye exhibits no discharge.  Neck: Normal range of motion. Neck supple. No thyromegaly present.  Cardiovascular: Normal rate, regular rhythm, normal heart sounds and intact distal pulses.   No murmur heard. Pulmonary/Chest: Effort normal and breath sounds normal. No respiratory distress. He has no wheezes.  Abdominal: Soft. Bowel sounds are normal. He exhibits no distension. There is no tenderness.  Musculoskeletal: Normal range of motion. He exhibits no edema or tenderness.  Neurological: He is alert and oriented to person, place, and time. He has normal reflexes. No cranial nerve deficit.  Skin: Skin is warm and dry. No rash noted. No erythema.  Psychiatric: He has a normal mood and affect. His behavior is normal. Judgment and thought content normal.  Vitals reviewed.  BP 150/91 mmHg   Pulse 79  Temp(Src) 97.4 F (36.3   C) (Oral)  Ht 5' 1" (1.549 m)  Wt 123 lb (55.792 kg)  BMI 23.25 kg/m2        Assessment & Plan:  1. Essential hypertension -Increase medication from Olmesartan-Amlodipine-HCTZ 40-5-12 mg -Dash diet information given -Exercise encouraged - Stress Management  -Continue current meds -RTO in 2 weeks CMP14+EGFR - Olmesartan-Amlodipine-HCTZ 40-10-25 MG TABS; Take 1 tablet by mouth daily.  Dispense: 90 tablet; Refill: 4  2. Type 2 diabetes mellitus without complication - POCT glycosylated hemoglobin (Hb A1C) - POCT UA - Microalbumin - CMP14+EGFR - metFORMIN (GLUCOPHAGE) 1000 MG tablet; TAKE 1 TABLET (1,000 MG TOTAL) BY MOUTH 2 (TWO) TIMES DAILY WITH A MEA L.  Dispense: 180 tablet; Refill: 3  3. Hyperlipidemia - CMP14+EGFR - Lipid panel - rosuvastatin (CRESTOR) 10 MG tablet; Take 1 tablet (10 mg total) by mouth daily.  Dispense: 90 tablet; Refill: 3  4. Vitamin D deficiency - Vit D  25 hydroxy (rtn osteoporosis monitoring  5. Prostate cancer screening - PSA, total and free   Continue all meds Labs pending Health Maintenance reviewed Diet and exercise encouraged RTO 2 weeks for blood pressure recheck    , FNP   

## 2014-04-26 NOTE — Addendum Note (Signed)
Addended by: Earlene Plater on: 04/26/2014 05:54 PM   Modules accepted: Orders

## 2014-04-27 LAB — CMP14+EGFR
ALT: 14 IU/L (ref 0–44)
AST: 18 IU/L (ref 0–40)
Albumin/Globulin Ratio: 1.5 (ref 1.1–2.5)
Albumin: 4.6 g/dL (ref 3.5–5.5)
Alkaline Phosphatase: 59 IU/L (ref 39–117)
BILIRUBIN TOTAL: 0.5 mg/dL (ref 0.0–1.2)
BUN / CREAT RATIO: 13 (ref 9–20)
BUN: 15 mg/dL (ref 6–24)
CALCIUM: 10 mg/dL (ref 8.7–10.2)
CO2: 25 mmol/L (ref 18–29)
CREATININE: 1.18 mg/dL (ref 0.76–1.27)
Chloride: 105 mmol/L (ref 97–108)
GFR calc non Af Amer: 68 mL/min/{1.73_m2} (ref >59)
GFR, EST AFRICAN AMERICAN: 79 mL/min/{1.73_m2} (ref >59)
Globulin, Total: 3.1 g/dL (ref 1.5–4.5)
Glucose: 106 mg/dL — ABNORMAL HIGH (ref 65–99)
Potassium: 4.7 mmol/L (ref 3.5–5.2)
Sodium: 145 mmol/L — ABNORMAL HIGH (ref 134–144)
TOTAL PROTEIN: 7.7 g/dL (ref 6.0–8.5)

## 2014-04-27 LAB — LIPID PANEL
Chol/HDL Ratio: 1.8 ratio units (ref 0.0–5.0)
Cholesterol, Total: 142 mg/dL (ref 100–199)
HDL: 78 mg/dL (ref >39)
LDL CALC: 55 mg/dL (ref 0–99)
Triglycerides: 43 mg/dL (ref 0–149)
VLDL Cholesterol Cal: 9 mg/dL (ref 5–40)

## 2014-04-27 LAB — PSA, TOTAL AND FREE
PSA FREE PCT: 25.6 %
PSA FREE: 0.23 ng/mL
PSA: 0.9 ng/mL (ref 0.0–4.0)

## 2014-04-27 LAB — VITAMIN D 25 HYDROXY (VIT D DEFICIENCY, FRACTURES): Vit D, 25-Hydroxy: 43.1 ng/mL (ref 30.0–100.0)

## 2014-04-27 LAB — MICROALBUMIN, URINE: Microalbumin, Urine: 47.1 ug/mL — ABNORMAL HIGH (ref 0.0–17.0)

## 2014-04-28 ENCOUNTER — Telehealth: Payer: Self-pay | Admitting: *Deleted

## 2014-04-28 NOTE — Telephone Encounter (Signed)
LM,  Labs look good.

## 2014-04-28 NOTE — Telephone Encounter (Signed)
-----   Message from [name], [place] sent at 04/27/2014  2:38 PM EST ----- Microalbumin stable- Keep good control of blood pressure and blood sugars HgbA1C WNL Kidney and liver function stable Cholesterol levels WNL Vit D levels WNL PSA WNL

## 2014-05-16 ENCOUNTER — Other Ambulatory Visit: Payer: Self-pay | Admitting: Family Medicine

## 2014-05-16 NOTE — Telephone Encounter (Signed)
Patient was seen on 04-26-14 and was to return in two weeks for follow up BP check. Has not been seen or does not have an appt. Please advise on refill.

## 2014-05-21 ENCOUNTER — Other Ambulatory Visit: Payer: Self-pay | Admitting: Family Medicine

## 2014-11-18 ENCOUNTER — Encounter: Payer: Self-pay | Admitting: Family

## 2014-11-18 ENCOUNTER — Ambulatory Visit (INDEPENDENT_AMBULATORY_CARE_PROVIDER_SITE_OTHER): Payer: Medicare Other | Admitting: Family

## 2014-11-18 VITALS — BP 132/83 | HR 70 | Temp 97.1°F | Ht 61.0 in | Wt 115.4 lb

## 2014-11-18 DIAGNOSIS — Z1159 Encounter for screening for other viral diseases: Secondary | ICD-10-CM | POA: Diagnosis not present

## 2014-11-18 DIAGNOSIS — I10 Essential (primary) hypertension: Secondary | ICD-10-CM

## 2014-11-18 DIAGNOSIS — E785 Hyperlipidemia, unspecified: Secondary | ICD-10-CM

## 2014-11-18 DIAGNOSIS — E119 Type 2 diabetes mellitus without complications: Secondary | ICD-10-CM

## 2014-11-18 LAB — POCT UA - MICROALBUMIN: Microalbumin Ur, POC: 20 mg/L

## 2014-11-18 NOTE — Addendum Note (Signed)
Addended by: Selmer Dominion on: 11/18/2014 04:54 PM   Modules accepted: Orders

## 2014-11-18 NOTE — Progress Notes (Signed)
Subjective:    Patient ID: Eric Stuart, male    DOB: 01/18/1956, 59 y.o.   MRN: [id number]  Pt presents to the office today for chronic follow up.  Diabetes He presents for his follow-up diabetic visit. He has type 2 diabetes mellitus. His disease course has been stable. Pertinent negatives for hypoglycemia include no confusion, dizziness or headaches. Pertinent negatives for diabetes include no blurred vision, no foot paresthesias, no foot ulcerations and no visual change. Pertinent negatives for hypoglycemia complications include no blackouts and no hospitalization. Symptoms are stable. Pertinent negatives for diabetic complications include no CVA, heart disease, nephropathy or peripheral neuropathy. Risk factors for coronary artery disease include diabetes mellitus, dyslipidemia, hypertension and male sex. Current diabetic treatment includes oral agent (monotherapy). He is compliant with treatment all of the time. He is following a generally unhealthy diet. (Pt does not check blood sugars ) An ACE inhibitor/angiotensin II receptor blocker is being taken. Eye exam is current.  Hyperlipidemia This is a chronic problem. The current episode started more than 1 year ago. The problem is controlled. Recent lipid tests were reviewed and are normal. Exacerbating diseases include diabetes. He has no history of hypothyroidism. Pertinent negatives include no leg pain, myalgias or shortness of breath. Current antihyperlipidemic treatment includes statins. The current treatment provides moderate improvement of lipids. Risk factors for coronary artery disease include diabetes mellitus, dyslipidemia, family history, hypertension and male sex.  Hypertension This is a chronic problem. The current episode started more than 1 year ago. The problem has been waxing and waning since onset. The problem is uncontrolled. Pertinent negatives include no anxiety, blurred vision, headaches, palpitations, peripheral edema or  shortness of breath. Past treatments include diuretics, calcium channel blockers and angiotensin blockers. The current treatment provides mild improvement. There is no history of kidney disease, CAD/MI, CVA, heart failure or a thyroid problem. There is no history of sleep apnea.      Review of Systems  Constitutional: Negative.   HENT: Negative.   Eyes: Negative for blurred vision.  Respiratory: Negative.  Negative for shortness of breath.   Cardiovascular: Negative.  Negative for palpitations.  Gastrointestinal: Negative.   Endocrine: Negative.   Genitourinary: Negative.   Musculoskeletal: Negative.  Negative for myalgias.  Neurological: Negative.  Negative for dizziness and headaches.  Hematological: Negative.   Psychiatric/Behavioral: Negative.  Negative for confusion.  All other systems reviewed and are negative.      Objective:   Physical Exam  Constitutional: He is oriented to person, place, and time. He appears well-developed and well-nourished. No distress.  HENT:  Head: Normocephalic.  Right Ear: External ear normal.  Left Ear: External ear normal.  Mouth/Throat: Oropharynx is clear and moist.  Eyes: Pupils are equal, round, and reactive to light. Right eye exhibits no discharge. Left eye exhibits no discharge.  Neck: Normal range of motion. Neck supple. No thyromegaly present.  Cardiovascular: Normal rate, regular rhythm, normal heart sounds and intact distal pulses.   No murmur heard. Pulmonary/Chest: Effort normal and breath sounds normal. No respiratory distress. He has no wheezes.  Abdominal: Soft. Bowel sounds are normal. He exhibits no distension. There is no tenderness.  Musculoskeletal: Normal range of motion. He exhibits no edema or tenderness.  Neurological: He is alert and oriented to person, place, and time. He has normal reflexes. No cranial nerve deficit.  Skin: Skin is warm and dry. No rash noted. No erythema.  Psychiatric: He has a normal mood and  affect. His behavior is normal.  Judgment and thought content normal.  Vitals reviewed.    BP 132/83 mmHg  Pulse 70  Temp(Src) 97.1 F (36.2 C) (Oral)  Ht _0  (1.549 m)  Wt 115 lb 6.4 oz (52.345 kg)  BMI 21.82 kg/m2      Assessment & Plan:  1. Essential hypertension - CMP14+EGFR  2. Type 2 diabetes mellitus without complication - YYQ82+NOIB - POCT glycosylated hemoglobin (Hb A1C) - POCT UA - Microalbumin  3. Hyperlipidemia - CMP14+EGFR - Lipid panel  4. Need for hepatitis C screening test  - CMP14+EGFR - Hepatitis C antibody   Continue all meds Labs pending Health Maintenance reviewed Diet and exercise encouraged RTO 6 months  [name], FNP

## 2014-11-18 NOTE — Patient Instructions (Signed)

## 2014-11-19 LAB — MICROALBUMIN, URINE: Microalbumin, Urine: 17.9 ug/mL

## 2014-11-24 ENCOUNTER — Other Ambulatory Visit: Payer: Self-pay | Admitting: Family

## 2015-03-23 ENCOUNTER — Encounter: Payer: Self-pay | Admitting: Family

## 2015-03-23 ENCOUNTER — Ambulatory Visit (INDEPENDENT_AMBULATORY_CARE_PROVIDER_SITE_OTHER): Payer: Medicare Other | Admitting: Family

## 2015-03-23 VITALS — BP 135/90 | HR 65 | Temp 97.0°F | Ht 61.0 in | Wt 126.0 lb

## 2015-03-23 DIAGNOSIS — E785 Hyperlipidemia, unspecified: Secondary | ICD-10-CM

## 2015-03-23 DIAGNOSIS — I10 Essential (primary) hypertension: Secondary | ICD-10-CM | POA: Diagnosis not present

## 2015-03-23 DIAGNOSIS — E119 Type 2 diabetes mellitus without complications: Secondary | ICD-10-CM | POA: Diagnosis not present

## 2015-03-23 LAB — POCT GLYCOSYLATED HEMOGLOBIN (HGB A1C): Hemoglobin A1C: 6.3

## 2015-03-23 NOTE — Progress Notes (Signed)
Subjective:    Patient ID: Eric Stuart, male    DOB: 01/05/1956, 59 y.o.   MRN: [id number]  Diabetes He presents for his follow-up diabetic visit. He has type 2 diabetes mellitus. His disease course has been stable. Pertinent negatives for hypoglycemia include no confusion, dizziness or headaches. Pertinent negatives for diabetes include no blurred vision, no foot paresthesias, no foot ulcerations and no visual change. Pertinent negatives for hypoglycemia complications include no blackouts and no hospitalization. Symptoms are stable. Pertinent negatives for diabetic complications include no CVA, heart disease, nephropathy or peripheral neuropathy. Risk factors for coronary artery disease include diabetes mellitus, dyslipidemia, hypertension and male sex. Current diabetic treatment includes oral agent (monotherapy). He is compliant with treatment all of the time. He is following a generally unhealthy diet. (Pt does not check blood sugars ) An ACE inhibitor/angiotensin II receptor blocker is being taken. Eye exam is not current.  Hyperlipidemia This is a chronic problem. The current episode started more than 1 year ago. The problem is controlled. Recent lipid tests were reviewed and are normal. Exacerbating diseases include diabetes. He has no history of hypothyroidism. Pertinent negatives include no leg pain, myalgias or shortness of breath. Current antihyperlipidemic treatment includes statins. The current treatment provides moderate improvement of lipids. Risk factors for coronary artery disease include diabetes mellitus, dyslipidemia, family history, hypertension and male sex.  Hypertension This is a chronic problem. The current episode started more than 1 year ago. The problem has been waxing and waning since onset. The problem is controlled. Pertinent negatives include no anxiety, blurred vision, headaches, palpitations, peripheral edema or shortness of breath. Past treatments include diuretics,  calcium channel blockers and angiotensin blockers. The current treatment provides mild improvement. There is no history of kidney disease, CAD/MI, CVA, heart failure or a thyroid problem. There is no history of sleep apnea.      Review of Systems  Constitutional: Negative.   HENT: Negative.   Eyes: Negative for blurred vision.  Respiratory: Negative.  Negative for shortness of breath.   Cardiovascular: Negative.  Negative for palpitations.  Gastrointestinal: Negative.   Endocrine: Negative.   Genitourinary: Negative.   Musculoskeletal: Negative.  Negative for myalgias.  Neurological: Negative.  Negative for dizziness and headaches.  Hematological: Negative.   Psychiatric/Behavioral: Negative.  Negative for confusion.  All other systems reviewed and are negative.      Objective:   Physical Exam  Constitutional: He is oriented to person, place, and time. He appears well-developed and well-nourished. No distress.  HENT:  Head: Normocephalic.  Right Ear: External ear normal.  Left Ear: External ear normal.  Nose: Nose normal.  Mouth/Throat: Oropharynx is clear and moist.  Eyes: Pupils are equal, round, and reactive to light. Right eye exhibits no discharge. Left eye exhibits no discharge.  Neck: Normal range of motion. Neck supple. No thyromegaly present.  Cardiovascular: Normal rate, regular rhythm, normal heart sounds and intact distal pulses.   No murmur heard. Pulmonary/Chest: Effort normal and breath sounds normal. No respiratory distress. He has no wheezes.  Abdominal: Soft. Bowel sounds are normal. He exhibits no distension. There is no tenderness.  Musculoskeletal: Normal range of motion. He exhibits no edema or tenderness.  Neurological: He is alert and oriented to person, place, and time. He has normal reflexes. No cranial nerve deficit.  Skin: Skin is warm and dry. No rash noted. No erythema.  Psychiatric: He has a normal mood and affect. His behavior is normal.  Judgment and thought content normal.  Vitals reviewed.     BP 135/90 mmHg  Pulse 65  Temp(Src) 97 F (36.1 C) (Oral)  Ht _0  (1.549 m)  Wt 126 lb (57.153 kg)  BMI 23.82 kg/m2     Assessment & Plan:  1. Hyperlipidemia - CMP14+EGFR - Lipid panel  2. Type 2 diabetes mellitus without complication, without long-term current use of insulin (HCC) - POCT glycosylated hemoglobin (Hb A1C) - CMP14+EGFR - Ambulatory referral to Ophthalmology  3. Essential hypertension - CMP14+EGFR   Continue all meds Labs pending Health Maintenance reviewed Diet and exercise encouraged RTO 4 months  [name], FNP

## 2015-03-23 NOTE — Patient Instructions (Signed)

## 2015-03-24 ENCOUNTER — Encounter: Payer: Self-pay | Admitting: *Deleted

## 2015-03-24 LAB — CMP14+EGFR
A/G RATIO: 1.6 (ref 1.1–2.5)
ALT: 13 IU/L (ref 0–44)
AST: 16 IU/L (ref 0–40)
Albumin: 4.3 g/dL (ref 3.5–5.5)
Alkaline Phosphatase: 55 IU/L (ref 39–117)
BUN/Creatinine Ratio: 15 (ref 9–20)
BUN: 19 mg/dL (ref 6–24)
Bilirubin Total: 0.3 mg/dL (ref 0.0–1.2)
CALCIUM: 9.5 mg/dL (ref 8.7–10.2)
CO2: 22 mmol/L (ref 18–29)
CREATININE: 1.25 mg/dL (ref 0.76–1.27)
Chloride: 102 mmol/L (ref 96–106)
GFR, EST AFRICAN AMERICAN: 73 mL/min/{1.73_m2} (ref >59)
GFR, EST NON AFRICAN AMERICAN: 63 mL/min/{1.73_m2} (ref >59)
Globulin, Total: 2.7 g/dL (ref 1.5–4.5)
Glucose: 143 mg/dL — ABNORMAL HIGH (ref 65–99)
POTASSIUM: 4.1 mmol/L (ref 3.5–5.2)
Sodium: 141 mmol/L (ref 134–144)
TOTAL PROTEIN: 7 g/dL (ref 6.0–8.5)

## 2015-03-24 LAB — LIPID PANEL
CHOL/HDL RATIO: 2.1 ratio (ref 0.0–5.0)
Cholesterol, Total: 158 mg/dL (ref 100–199)
HDL: 74 mg/dL (ref >39)
LDL CALC: 68 mg/dL (ref 0–99)
TRIGLYCERIDES: 82 mg/dL (ref 0–149)
VLDL CHOLESTEROL CAL: 16 mg/dL (ref 5–40)

## 2015-03-28 ENCOUNTER — Other Ambulatory Visit: Payer: Self-pay | Admitting: Family

## 2015-05-24 ENCOUNTER — Other Ambulatory Visit: Payer: Self-pay | Admitting: Family

## 2015-05-24 ENCOUNTER — Telehealth: Payer: Self-pay | Admitting: Family

## 2015-05-24 DIAGNOSIS — E785 Hyperlipidemia, unspecified: Secondary | ICD-10-CM

## 2015-05-24 DIAGNOSIS — I10 Essential (primary) hypertension: Secondary | ICD-10-CM

## 2015-05-24 MED ORDER — OLMESARTAN-AMLODIPINE-HCTZ 40-10-25 MG PO TABS
1.0000 | ORAL_TABLET | Freq: Every day | ORAL | Status: DC
Start: 2015-05-24 — End: 2015-08-19

## 2015-05-24 MED ORDER — OLMESARTAN-AMLODIPINE-HCTZ 40-5-12.5 MG PO TABS: 1.0000 | ORAL_TABLET | Freq: Every day | ORAL | Status: DC

## 2015-05-24 MED ORDER — ROSUVASTATIN CALCIUM 10 MG PO TABS: 10.0000 mg | ORAL_TABLET | Freq: Every day | ORAL | Status: DC

## 2015-05-25 ENCOUNTER — Other Ambulatory Visit: Payer: Self-pay | Admitting: Family

## 2015-05-25 NOTE — Telephone Encounter (Signed)
Confirmed with Alyse Low, tribenzor should be 40-10-25, [facility] notified

## 2015-06-02 ENCOUNTER — Other Ambulatory Visit: Payer: Self-pay | Admitting: Family

## 2015-07-24 ENCOUNTER — Ambulatory Visit (INDEPENDENT_AMBULATORY_CARE_PROVIDER_SITE_OTHER): Payer: Medicare Other | Admitting: Family

## 2015-07-24 ENCOUNTER — Encounter (INDEPENDENT_AMBULATORY_CARE_PROVIDER_SITE_OTHER): Payer: Self-pay

## 2015-07-24 ENCOUNTER — Encounter: Payer: Self-pay | Admitting: Family

## 2015-07-24 VITALS — BP 133/85 | HR 84 | Temp 97.1°F | Ht 61.0 in | Wt 123.0 lb

## 2015-07-24 DIAGNOSIS — I10 Essential (primary) hypertension: Secondary | ICD-10-CM

## 2015-07-24 DIAGNOSIS — Z114 Encounter for screening for human immunodeficiency virus [HIV]: Secondary | ICD-10-CM | POA: Diagnosis not present

## 2015-07-24 DIAGNOSIS — E785 Hyperlipidemia, unspecified: Secondary | ICD-10-CM | POA: Diagnosis not present

## 2015-07-24 DIAGNOSIS — E119 Type 2 diabetes mellitus without complications: Secondary | ICD-10-CM | POA: Diagnosis not present

## 2015-07-24 LAB — BAYER DCA HB A1C WAIVED: HB A1C: 6.8 % (ref <7.0)

## 2015-07-24 NOTE — Progress Notes (Signed)
Subjective:    Patient ID: Eric Stuart, male    DOB: [birth year], 60 y.o.   MRN: [id number]  Pt presents to the office today for chronic follow up.  Diabetes He presents for his follow-up diabetic visit. He has type 2 diabetes mellitus. His disease course has been stable. Pertinent negatives for hypoglycemia include no confusion, dizziness or headaches. Pertinent negatives for diabetes include no blurred vision, no foot paresthesias, no foot ulcerations and no visual change. Pertinent negatives for hypoglycemia complications include no blackouts and no hospitalization. Symptoms are stable. Pertinent negatives for diabetic complications include no CVA, heart disease, nephropathy or peripheral neuropathy. Risk factors for coronary artery disease include diabetes mellitus, dyslipidemia, hypertension and male sex. Current diabetic treatment includes oral agent (monotherapy). He is compliant with treatment all of the time. He is following a generally unhealthy diet. (Pt does not check blood sugars ) An ACE inhibitor/angiotensin II receptor blocker is being taken. Eye exam is not current.  Hypertension This is a chronic problem. The current episode started more than 1 year ago. The problem has been resolved since onset. The problem is controlled. Pertinent negatives include no anxiety, blurred vision, headaches, palpitations, peripheral edema or shortness of breath. Past treatments include diuretics, calcium channel blockers and angiotensin blockers. The current treatment provides mild improvement. There is no history of kidney disease, CAD/MI, CVA, heart failure or a thyroid problem. There is no history of sleep apnea.  Hyperlipidemia This is a chronic problem. The current episode started more than 1 year ago. The problem is controlled. Recent lipid tests were reviewed and are normal. Exacerbating diseases include diabetes. He has no history of hypothyroidism. Pertinent negatives include no leg pain,  myalgias or shortness of breath. Current antihyperlipidemic treatment includes statins. The current treatment provides moderate improvement of lipids. Risk factors for coronary artery disease include diabetes mellitus, dyslipidemia, family history, hypertension and male sex.      Review of Systems  Constitutional: Negative.   HENT: Negative.   Eyes: Negative for blurred vision.  Respiratory: Negative.  Negative for shortness of breath.   Cardiovascular: Negative.  Negative for palpitations.  Gastrointestinal: Negative.   Endocrine: Negative.   Genitourinary: Negative.   Musculoskeletal: Negative.  Negative for myalgias.  Neurological: Negative.  Negative for dizziness and headaches.  Hematological: Negative.   Psychiatric/Behavioral: Negative.  Negative for confusion.  All other systems reviewed and are negative.  Family History  Problem Relation Age of Onset  . Diabetes Sister   . Diabetes Brother    Social History   Social History  . Marital Status: Married    Spouse Name: N/A  . Number of Children: N/A  . Years of Education: N/A   Social History Main Topics  . Smoking status: Former Smoker -- 2.00 packs/day for 4 years    Quit date: 09/29/2013  . Smokeless tobacco: Never Used  . Alcohol Use: No  . Drug Use: No  . Sexual Activity: Not on file   Other Topics Concern  . Not on file   Social History Narrative       Objective:   Physical Exam  Constitutional: He is oriented to person, place, and time. He appears well-developed and well-nourished. No distress.  HENT:  Head: Normocephalic.  Right Ear: External ear normal.  Left Ear: External ear normal.  Nose: Nose normal.  Mouth/Throat: Oropharynx is clear and moist.  Eyes: Pupils are equal, round, and reactive to light. Right eye exhibits no discharge. Left eye exhibits no discharge.  Neck: Normal range of motion. Neck supple. No thyromegaly present.  Cardiovascular: Normal rate, regular rhythm, normal heart  sounds and intact distal pulses.   No murmur heard. Pulmonary/Chest: Effort normal and breath sounds normal. No respiratory distress. He has no wheezes.  Abdominal: Soft. Bowel sounds are normal. He exhibits no distension. There is no tenderness.  Musculoskeletal: Normal range of motion. He exhibits no edema or tenderness.  Neurological: He is alert and oriented to person, place, and time. He has normal reflexes. No cranial nerve deficit.  Skin: Skin is warm and dry. No rash noted. No erythema.  Psychiatric: He has a normal mood and affect. His behavior is normal. Judgment and thought content normal.  Vitals reviewed.     BP 133/85 mmHg  Pulse 84  Temp(Src) 97.1 F (36.2 C) (Oral)  Ht '5\' 1"'  (1.549 m)  Wt 123 lb (55.792 kg)  BMI 23.25 kg/m2     Assessment & Plan:  1. Essential hypertension - CMP14+EGFR  2. Type 2 diabetes mellitus without complication, without long-term current use of insulin (HCC) - CMP14+EGFR - Bayer DCA Hb A1c Waived - Ambulatory referral to Ophthalmology  3. Hyperlipidemia - CMP14+EGFR - Lipid panel  4. Screening for HIV (human immunodeficiency virus) - HIV antibody   Continue all meds Labs pending Health Maintenance reviewed Diet and exercise encouraged RTO 6 months  [name], FNP

## 2015-07-24 NOTE — Patient Instructions (Signed)

## 2015-07-25 LAB — CMP14+EGFR
ALBUMIN: 4.3 g/dL (ref 3.5–5.5)
ALK PHOS: 52 IU/L (ref 39–117)
ALT: 15 IU/L (ref 0–44)
AST: 14 IU/L (ref 0–40)
Albumin/Globulin Ratio: 1.7 (ref 1.2–2.2)
BILIRUBIN TOTAL: 0.5 mg/dL (ref 0.0–1.2)
BUN / CREAT RATIO: 17 (ref 9–20)
BUN: 20 mg/dL (ref 6–24)
CHLORIDE: 104 mmol/L (ref 96–106)
CO2: 21 mmol/L (ref 18–29)
CREATININE: 1.18 mg/dL (ref 0.76–1.27)
Calcium: 9.4 mg/dL (ref 8.7–10.2)
GFR calc non Af Amer: 68 mL/min/{1.73_m2} (ref >59)
GFR, EST AFRICAN AMERICAN: 78 mL/min/{1.73_m2} (ref >59)
Globulin, Total: 2.6 g/dL (ref 1.5–4.5)
Glucose: 104 mg/dL — ABNORMAL HIGH (ref 65–99)
Potassium: 4.3 mmol/L (ref 3.5–5.2)
SODIUM: 143 mmol/L (ref 134–144)
TOTAL PROTEIN: 6.9 g/dL (ref 6.0–8.5)

## 2015-07-25 LAB — LIPID PANEL
CHOL/HDL RATIO: 2.2 ratio (ref 0.0–5.0)
Cholesterol, Total: 146 mg/dL (ref 100–199)
HDL: 67 mg/dL (ref >39)
LDL Calculated: 55 mg/dL (ref 0–99)
Triglycerides: 121 mg/dL (ref 0–149)
VLDL CHOLESTEROL CAL: 24 mg/dL (ref 5–40)

## 2015-07-25 LAB — HIV ANTIBODY (ROUTINE TESTING W REFLEX): HIV SCREEN 4TH GENERATION: NONREACTIVE

## 2015-08-08 DIAGNOSIS — Z7984 Long term (current) use of oral hypoglycemic drugs: Secondary | ICD-10-CM | POA: Diagnosis not present

## 2015-08-08 DIAGNOSIS — E119 Type 2 diabetes mellitus without complications: Secondary | ICD-10-CM | POA: Diagnosis not present

## 2015-08-08 LAB — HM DIABETES EYE EXAM

## 2015-08-19 ENCOUNTER — Other Ambulatory Visit: Payer: Self-pay | Admitting: Family

## 2015-11-14 ENCOUNTER — Other Ambulatory Visit: Payer: Self-pay | Admitting: Family

## 2015-11-14 NOTE — Telephone Encounter (Signed)
Patient has appt 08/25

## 2015-11-14 NOTE — Telephone Encounter (Signed)
Patient NTBS for follow up and lab work. No more refills until seen

## 2015-11-24 ENCOUNTER — Ambulatory Visit: Payer: Medicare Other | Admitting: Family

## 2015-11-26 ENCOUNTER — Other Ambulatory Visit: Payer: Self-pay | Admitting: Family

## 2015-11-27 ENCOUNTER — Encounter: Payer: Self-pay | Admitting: Family

## 2015-11-29 ENCOUNTER — Ambulatory Visit (INDEPENDENT_AMBULATORY_CARE_PROVIDER_SITE_OTHER): Payer: Medicare Other | Admitting: Family

## 2015-11-29 ENCOUNTER — Encounter: Payer: Self-pay | Admitting: Family

## 2015-11-29 VITALS — BP 133/88 | HR 62 | Temp 97.4°F | Ht 61.0 in | Wt 119.2 lb

## 2015-11-29 DIAGNOSIS — Z125 Encounter for screening for malignant neoplasm of prostate: Secondary | ICD-10-CM

## 2015-11-29 DIAGNOSIS — I10 Essential (primary) hypertension: Secondary | ICD-10-CM | POA: Diagnosis not present

## 2015-11-29 DIAGNOSIS — E785 Hyperlipidemia, unspecified: Secondary | ICD-10-CM | POA: Diagnosis not present

## 2015-11-29 DIAGNOSIS — E119 Type 2 diabetes mellitus without complications: Secondary | ICD-10-CM | POA: Diagnosis not present

## 2015-11-29 DIAGNOSIS — R351 Nocturia: Secondary | ICD-10-CM

## 2015-11-29 LAB — BAYER DCA HB A1C WAIVED: HB A1C (BAYER DCA - WAIVED): 6.3 % (ref <7.0)

## 2015-11-29 NOTE — Patient Instructions (Signed)

## 2015-11-29 NOTE — Progress Notes (Signed)
Subjective:    Patient ID: Eric Stuart, male    DOB: [birth year], 60 y.o.   MRN: [id number]  Pt presents to the office today for chronic follow up.  Diabetes  He presents for his follow-up diabetic visit. He has type 2 diabetes mellitus. His disease course has been stable. Pertinent negatives for hypoglycemia include no confusion, dizziness or headaches. Pertinent negatives for diabetes include no blurred vision, no foot paresthesias, no foot ulcerations and no visual change. Pertinent negatives for hypoglycemia complications include no blackouts and no hospitalization. Symptoms are stable. Pertinent negatives for diabetic complications include no CVA, heart disease, nephropathy or peripheral neuropathy. Risk factors for coronary artery disease include diabetes mellitus, dyslipidemia, hypertension and male sex. Current diabetic treatment includes oral agent (monotherapy). He is compliant with treatment all of the time. He is following a generally unhealthy diet. (Pt does not check blood sugars ) An ACE inhibitor/angiotensin II receptor blocker is being taken. Eye exam is current.  Medication Refill  Pertinent negatives include no headaches, myalgias or visual change.  Hypertension  This is a chronic problem. The current episode started more than 1 year ago. The problem has been resolved since onset. The problem is controlled. Pertinent negatives include no anxiety, blurred vision, headaches, palpitations, peripheral edema or shortness of breath. Past treatments include diuretics, calcium channel blockers and angiotensin blockers. The current treatment provides mild improvement. There is no history of kidney disease, CAD/MI, CVA, heart failure or a thyroid problem. There is no history of sleep apnea.  Hyperlipidemia  This is a chronic problem. The current episode started more than 1 year ago. The problem is controlled. Recent lipid tests were reviewed and are normal. Exacerbating diseases include  diabetes. He has no history of hypothyroidism. Pertinent negatives include no leg pain, myalgias or shortness of breath. Current antihyperlipidemic treatment includes statins. The current treatment provides moderate improvement of lipids. Risk factors for coronary artery disease include diabetes mellitus, dyslipidemia, family history, hypertension and male sex.      Review of Systems  Constitutional: Negative.   HENT: Negative.   Eyes: Negative for blurred vision.  Respiratory: Negative.  Negative for shortness of breath.   Cardiovascular: Negative.  Negative for palpitations.  Gastrointestinal: Negative.   Endocrine: Negative.   Genitourinary: Negative.   Musculoskeletal: Negative.  Negative for myalgias.  Neurological: Negative.  Negative for dizziness and headaches.  Hematological: Negative.   Psychiatric/Behavioral: Negative.  Negative for confusion.  All other systems reviewed and are negative.      Objective:   Physical Exam  Constitutional: He is oriented to person, place, and time. He appears well-developed and well-nourished. No distress.  HENT:  Head: Normocephalic.  Right Ear: External ear normal.  Left Ear: External ear normal.  Nose: Nose normal.  Mouth/Throat: Oropharynx is clear and moist.  Eyes: Pupils are equal, round, and reactive to light. Right eye exhibits no discharge. Left eye exhibits no discharge.  Neck: Normal range of motion. Neck supple. No thyromegaly present.  Cardiovascular: Normal rate, regular rhythm, normal heart sounds and intact distal pulses.   No murmur heard. Pulmonary/Chest: Effort normal and breath sounds normal. No respiratory distress. He has no wheezes.  Abdominal: Soft. Bowel sounds are normal. He exhibits no distension. There is no tenderness.  Musculoskeletal: Normal range of motion. He exhibits no edema or tenderness.  Neurological: He is alert and oriented to person, place, and time. He has normal reflexes. No cranial nerve  deficit.  Skin: Skin is warm and dry.  No rash noted. No erythema.  Psychiatric: He has a normal mood and affect. His behavior is normal. Judgment and thought content normal.  Vitals reviewed.     BP 133/88   Pulse 62   Temp 97.4 F (36.3 C) (Oral)   Ht '5\' 1"'  (1.549 m)   Wt 119 lb 3.2 oz (54.1 kg)   BMI 22.52 kg/m      Assessment & Plan:  1. Essential hypertension - CMP14+EGFR - CBC with Differential/Platelet  2. Type 2 diabetes mellitus without complication, without long-term current use of insulin (HCC) - CMP14+EGFR - Bayer DCA Hb A1c Waived - CBC with Differential/Platelet  3. Hyperlipidemia - CMP14+EGFR - Lipid panel - CBC with Differential/Platelet  4. Prostate cancer screening - PSA, total and free - CBC with Differential/Platelet  5. Nocturia - PSA, total and free - CBC with Differential/Platelet   Continue all meds Labs pending Health Maintenance reviewed Diet and exercise encouraged RTO 6 months   [name], FNP

## 2015-11-30 LAB — CBC WITH DIFFERENTIAL/PLATELET
BASOS: 0 %
Basophils Absolute: 0 10*3/uL (ref 0.0–0.2)
EOS (ABSOLUTE): 0.1 10*3/uL (ref 0.0–0.4)
EOS: 3 %
HEMATOCRIT: 35.5 % — AB (ref 37.5–51.0)
Hemoglobin: 11.5 g/dL — ABNORMAL LOW (ref 12.6–17.7)
Immature Grans (Abs): 0 10*3/uL (ref 0.0–0.1)
Immature Granulocytes: 0 %
LYMPHS ABS: 1.3 10*3/uL (ref 0.7–3.1)
Lymphs: 25 %
MCH: 27.3 pg (ref 26.6–33.0)
MCHC: 32.4 g/dL (ref 31.5–35.7)
MCV: 84 fL (ref 79–97)
MONOS ABS: 0.6 10*3/uL (ref 0.1–0.9)
Monocytes: 12 %
Neutrophils Absolute: 3 10*3/uL (ref 1.4–7.0)
Neutrophils: 60 %
PLATELETS: 202 10*3/uL (ref 150–379)
RBC: 4.21 x10E6/uL (ref 4.14–5.80)
RDW: 17.4 % — ABNORMAL HIGH (ref 12.3–15.4)
WBC: 5.1 10*3/uL (ref 3.4–10.8)

## 2015-11-30 LAB — CMP14+EGFR
A/G RATIO: 1.7 (ref 1.2–2.2)
ALT: 10 IU/L (ref 0–44)
AST: 15 IU/L (ref 0–40)
Albumin: 4.5 g/dL (ref 3.5–5.5)
Alkaline Phosphatase: 51 IU/L (ref 39–117)
BILIRUBIN TOTAL: 0.4 mg/dL (ref 0.0–1.2)
BUN/Creatinine Ratio: 16 (ref 9–20)
BUN: 17 mg/dL (ref 6–24)
CO2: 24 mmol/L (ref 18–29)
Calcium: 9.5 mg/dL (ref 8.7–10.2)
Chloride: 105 mmol/L (ref 96–106)
Creatinine, Ser: 1.08 mg/dL (ref 0.76–1.27)
GFR, EST AFRICAN AMERICAN: 86 mL/min/{1.73_m2} (ref >59)
GFR, EST NON AFRICAN AMERICAN: 75 mL/min/{1.73_m2} (ref >59)
GLOBULIN, TOTAL: 2.6 g/dL (ref 1.5–4.5)
Glucose: 125 mg/dL — ABNORMAL HIGH (ref 65–99)
POTASSIUM: 4.4 mmol/L (ref 3.5–5.2)
Sodium: 142 mmol/L (ref 134–144)
TOTAL PROTEIN: 7.1 g/dL (ref 6.0–8.5)

## 2015-11-30 LAB — LIPID PANEL
CHOL/HDL RATIO: 1.9 ratio (ref 0.0–5.0)
Cholesterol, Total: 138 mg/dL (ref 100–199)
HDL: 72 mg/dL (ref >39)
LDL Calculated: 53 mg/dL (ref 0–99)
Triglycerides: 67 mg/dL (ref 0–149)
VLDL CHOLESTEROL CAL: 13 mg/dL (ref 5–40)

## 2015-11-30 LAB — PSA, TOTAL AND FREE
PROSTATE SPECIFIC AG, SERUM: 0.8 ng/mL (ref 0.0–4.0)
PSA, Free Pct: 41.3 %
PSA, Free: 0.33 ng/mL

## 2015-12-12 ENCOUNTER — Other Ambulatory Visit: Payer: Self-pay | Admitting: Family

## 2015-12-14 ENCOUNTER — Other Ambulatory Visit: Payer: Self-pay | Admitting: Family

## 2016-01-01 ENCOUNTER — Other Ambulatory Visit (INDEPENDENT_AMBULATORY_CARE_PROVIDER_SITE_OTHER): Payer: Medicare Other

## 2016-01-01 ENCOUNTER — Other Ambulatory Visit: Payer: Self-pay | Admitting: Family

## 2016-01-01 DIAGNOSIS — Z1211 Encounter for screening for malignant neoplasm of colon: Secondary | ICD-10-CM

## 2016-01-02 LAB — FECAL OCCULT BLOOD, IMMUNOCHEMICAL: FECAL OCCULT BLD: NEGATIVE

## 2016-02-12 ENCOUNTER — Other Ambulatory Visit: Payer: Self-pay | Admitting: Family

## 2016-02-12 MED ORDER — METFORMIN HCL 1000 MG PO TABS: 1000.0000 mg | ORAL_TABLET | Freq: Two times a day (BID) | ORAL | 3 refills | Status: DC

## 2016-02-14 ENCOUNTER — Other Ambulatory Visit: Payer: Self-pay | Admitting: *Deleted

## 2016-02-14 MED ORDER — METFORMIN HCL 1000 MG PO TABS: 1000.0000 mg | ORAL_TABLET | Freq: Two times a day (BID) | ORAL | 1 refills | Status: DC

## 2016-02-20 ENCOUNTER — Other Ambulatory Visit: Payer: Self-pay | Admitting: Family

## 2016-02-26 DIAGNOSIS — Z23 Encounter for immunization: Secondary | ICD-10-CM | POA: Diagnosis not present

## 2016-05-27 ENCOUNTER — Other Ambulatory Visit: Payer: Self-pay | Admitting: Family

## 2016-05-30 ENCOUNTER — Ambulatory Visit (INDEPENDENT_AMBULATORY_CARE_PROVIDER_SITE_OTHER): Payer: Medicare Other | Admitting: Family

## 2016-05-30 ENCOUNTER — Encounter: Payer: Self-pay | Admitting: Family

## 2016-05-30 VITALS — BP 125/86 | HR 66 | Temp 97.1°F | Ht 61.0 in | Wt 124.0 lb

## 2016-05-30 DIAGNOSIS — E119 Type 2 diabetes mellitus without complications: Secondary | ICD-10-CM

## 2016-05-30 DIAGNOSIS — I10 Essential (primary) hypertension: Secondary | ICD-10-CM | POA: Diagnosis not present

## 2016-05-30 DIAGNOSIS — E785 Hyperlipidemia, unspecified: Secondary | ICD-10-CM

## 2016-05-30 LAB — BAYER DCA HB A1C WAIVED: HB A1C: 6.8 % (ref <7.0)

## 2016-05-30 NOTE — Progress Notes (Signed)
Subjective:    Patient ID: Eric Stuart, male    DOB: [birth year], 61 y.o.   MRN: [id number]  Pt presents to the office today for chronic follow up.  Diabetes  He presents for his follow-up diabetic visit. He has type 2 diabetes mellitus. His disease course has been stable. Pertinent negatives for hypoglycemia include no confusion, dizziness or headaches. Pertinent negatives for diabetes include no blurred vision, no foot paresthesias, no foot ulcerations and no visual change. Pertinent negatives for hypoglycemia complications include no blackouts and no hospitalization. Symptoms are stable. Pertinent negatives for diabetic complications include no CVA, heart disease, nephropathy or peripheral neuropathy. Risk factors for coronary artery disease include diabetes mellitus, dyslipidemia, hypertension and male sex. Current diabetic treatment includes oral agent (monotherapy). He is compliant with treatment all of the time. He is following a generally unhealthy diet. (Pt does not check blood sugars ) An ACE inhibitor/angiotensin II receptor blocker is being taken. Eye exam is current.  Hypertension  This is a chronic problem. The current episode started more than 1 year ago. The problem has been resolved since onset. The problem is controlled. Pertinent negatives include no anxiety, blurred vision, headaches, palpitations, peripheral edema or shortness of breath. Past treatments include diuretics, calcium channel blockers and angiotensin blockers. The current treatment provides mild improvement. There is no history of kidney disease, CAD/MI, CVA or heart failure. There is no history of sleep apnea or a thyroid problem.  Hyperlipidemia  This is a chronic problem. The current episode started more than 1 year ago. The problem is controlled. Recent lipid tests were reviewed and are normal. Exacerbating diseases include diabetes. He has no history of hypothyroidism. Pertinent negatives include no leg pain,  myalgias or shortness of breath. Current antihyperlipidemic treatment includes statins. The current treatment provides moderate improvement of lipids. Risk factors for coronary artery disease include diabetes mellitus, dyslipidemia, family history, hypertension and male sex.  Medication Refill  Pertinent negatives include no headaches, myalgias or visual change.      Review of Systems  Constitutional: Negative.   HENT: Negative.   Eyes: Negative for blurred vision.  Respiratory: Negative.  Negative for shortness of breath.   Cardiovascular: Negative.  Negative for palpitations.  Gastrointestinal: Negative.   Endocrine: Negative.   Genitourinary: Negative.   Musculoskeletal: Negative.  Negative for myalgias.  Neurological: Negative.  Negative for dizziness and headaches.  Hematological: Negative.   Psychiatric/Behavioral: Negative.  Negative for confusion.  All other systems reviewed and are negative.      Objective:   Physical Exam  Constitutional: He is oriented to person, place, and time. He appears well-developed and well-nourished. No distress.  HENT:  Head: Normocephalic.  Right Ear: External ear normal.  Left Ear: External ear normal.  Nose: Nose normal.  Mouth/Throat: Oropharynx is clear and moist.  Eyes: Pupils are equal, round, and reactive to light. Right eye exhibits no discharge. Left eye exhibits no discharge.  Neck: Normal range of motion. Neck supple. No thyromegaly present.  Cardiovascular: Normal rate, regular rhythm, normal heart sounds and intact distal pulses.   No murmur heard. Pulmonary/Chest: Effort normal and breath sounds normal. No respiratory distress. He has no wheezes.  Abdominal: Soft. Bowel sounds are normal. He exhibits no distension. There is no tenderness.  Musculoskeletal: Normal range of motion. He exhibits no edema or tenderness.  Neurological: He is alert and oriented to person, place, and time. He has normal reflexes. No cranial nerve  deficit.  Skin: Skin is warm and  dry. No rash noted. No erythema.  Psychiatric: He has a normal mood and affect. His behavior is normal. Judgment and thought content normal.  Vitals reviewed.     BP 125/86   Pulse 66   Temp 97.1 F (36.2 C) (Oral)   Ht '5\' 1"'  (1.549 m)   Wt 124 lb (56.2 kg)   BMI 23.43 kg/m      Assessment & Plan:  1. Essential hypertension - CMP14+EGFR  2. Type 2 diabetes mellitus without complication, without long-term current use of insulin (HCC) - CMP14+EGFR - Bayer DCA Hb A1c Waived - Microalbumin / creatinine urine ratio  3. Hyperlipidemia, unspecified hyperlipidemia type - CMP14+EGFR - Lipid panel   Continue all meds Labs pending Health Maintenance reviewed Diet and exercise encouraged RTO 6 months  [name], FNP

## 2016-05-30 NOTE — Patient Instructions (Signed)
Stress and Stress Management Stress is a normal reaction to life events. It is what you feel when life demands more than you are used to or more than you can handle. Some stress can be useful. For example, the stress reaction can help you catch the last bus of the day, study for a test, or meet a deadline at work. But stress that occurs too often or for too long can cause problems. It can affect your emotional health and interfere with relationships and normal daily activities. Too much stress can weaken your immune system and increase your risk for physical illness. If you already have a medical problem, stress can make it worse. What are the causes? All sorts of life events may cause stress. An event that causes stress for one person may not be stressful for another person. Major life events commonly cause stress. These may be positive or negative. Examples include losing your job, moving into a new home, getting married, having a baby, or losing a loved one. Less obvious life events may also cause stress, especially if they occur day after day or in combination. Examples include working long hours, driving in traffic, caring for children, being in debt, or being in a difficult relationship. What are the signs or symptoms? Stress may cause emotional symptoms including, the following:  Anxiety. This is feeling worried, afraid, on edge, overwhelmed, or out of control.  Anger. This is feeling irritated or impatient.  Depression. This is feeling sad, down, helpless, or guilty.  Difficulty focusing, remembering, or making decisions. Stress may cause physical symptoms, including the following:  Aches and pains. These may affect your head, neck, back, stomach, or other areas of your body.  Tight muscles or clenched jaw.  Low energy or trouble sleeping. Stress may cause unhealthy behaviors, including the following:  Eating to feel better (overeating) or skipping meals.  Sleeping too little, too  much, or both.  Working too much or putting off tasks (procrastination).  Smoking, drinking alcohol, or using drugs to feel better. How is this diagnosed? Stress is diagnosed through an assessment by your health care provider. Your health care provider will ask questions about your symptoms and any stressful life events.Your health care provider will also ask about your medical history and may order blood tests or other tests. Certain medical conditions and medicine can cause physical symptoms similar to stress. Mental illness can cause emotional symptoms and unhealthy behaviors similar to stress. Your health care provider may refer you to a mental health professional for further evaluation. How is this treated? Stress management is the recommended treatment for stress.The goals of stress management are reducing stressful life events and coping with stress in healthy ways. Techniques for reducing stressful life events include the following:  Stress identification. Self-monitor for stress and identify what causes stress for you. These skills may help you to avoid some stressful events.  Time management. Set your priorities, keep a calendar of events, and learn to say "no." These tools can help you avoid making too many commitments. Techniques for coping with stress include the following:  Rethinking the problem. Try to think realistically about stressful events rather than ignoring them or overreacting. Try to find the positives in a stressful situation rather than focusing on the negatives.  Exercise. Physical exercise can release both physical and emotional tension. The key is to find a form of exercise you enjoy and do it regularly.  Relaxation techniques. These relax the body and mind. Examples include yoga,  meditation, tai chi, biofeedback, deep breathing, progressive muscle relaxation, listening to music, being out in nature, journaling, and other hobbies. Again, the key is to find one or  more that you enjoy and can do regularly.  Healthy lifestyle. Eat a balanced diet, get plenty of sleep, and do not smoke. Avoid using alcohol or drugs to relax.  Strong support network. Spend time with family, friends, or other people you enjoy being around.Express your feelings and talk things over with someone you trust. Counseling or talktherapy with a mental health professional may be helpful if you are having difficulty managing stress on your own. Medicine is typically not recommended for the treatment of stress.Talk to your health care provider if you think you need medicine for symptoms of stress. Follow these instructions at home:  Keep all follow-up visits as directed by your health care provider.  Take all medicines as directed by your health care provider. Contact a health care provider if:  Your symptoms get worse or you start having new symptoms.  You feel overwhelmed by your problems and can no longer manage them on your own. Get help right away if:  You feel like hurting yourself or someone else. This information is not intended to replace advice given to you by your health care provider. Make sure you discuss any questions you have with your health care provider. Document Released: 09/11/2000 Document Revised: 08/24/2015 Document Reviewed: 11/10/2012 Elsevier Interactive Patient Education  2017 [employer].

## 2016-05-31 LAB — CMP14+EGFR
A/G RATIO: 1.9 (ref 1.2–2.2)
ALK PHOS: 52 IU/L (ref 39–117)
ALT: 12 IU/L (ref 0–44)
AST: 18 IU/L (ref 0–40)
Albumin: 4.5 g/dL (ref 3.5–5.5)
BILIRUBIN TOTAL: 0.4 mg/dL (ref 0.0–1.2)
BUN/Creatinine Ratio: 14 (ref 9–20)
BUN: 17 mg/dL (ref 6–24)
CHLORIDE: 102 mmol/L (ref 96–106)
CO2: 24 mmol/L (ref 18–29)
Calcium: 9.9 mg/dL (ref 8.7–10.2)
Creatinine, Ser: 1.2 mg/dL (ref 0.76–1.27)
GFR calc Af Amer: 76 mL/min/{1.73_m2} (ref >59)
GFR calc non Af Amer: 66 mL/min/{1.73_m2} (ref >59)
GLUCOSE: 110 mg/dL — AB (ref 65–99)
Globulin, Total: 2.4 g/dL (ref 1.5–4.5)
POTASSIUM: 4.8 mmol/L (ref 3.5–5.2)
Sodium: 143 mmol/L (ref 134–144)
Total Protein: 6.9 g/dL (ref 6.0–8.5)

## 2016-05-31 LAB — LIPID PANEL
CHOLESTEROL TOTAL: 141 mg/dL (ref 100–199)
Chol/HDL Ratio: 2 ratio units (ref 0.0–5.0)
HDL: 72 mg/dL (ref >39)
LDL Calculated: 58 mg/dL (ref 0–99)
Triglycerides: 56 mg/dL (ref 0–149)
VLDL CHOLESTEROL CAL: 11 mg/dL (ref 5–40)

## 2016-05-31 LAB — MICROALBUMIN / CREATININE URINE RATIO
Creatinine, Urine: 82.4 mg/dL
Microalb/Creat Ratio: 5.8 mg/g creat (ref 0.0–30.0)
Microalbumin, Urine: 4.8 ug/mL

## 2016-06-02 ENCOUNTER — Other Ambulatory Visit: Payer: Self-pay | Admitting: Family

## 2016-09-09 ENCOUNTER — Ambulatory Visit (INDEPENDENT_AMBULATORY_CARE_PROVIDER_SITE_OTHER): Payer: Medicare Other | Admitting: Physician Assistant

## 2016-09-09 ENCOUNTER — Encounter: Payer: Self-pay | Admitting: Physician Assistant

## 2016-09-09 VITALS — BP 120/79 | HR 68 | Temp 97.3°F | Ht 61.0 in | Wt 116.0 lb

## 2016-09-09 DIAGNOSIS — J069 Acute upper respiratory infection, unspecified: Secondary | ICD-10-CM

## 2016-09-09 MED ORDER — AMOXICILLIN 875 MG PO TABS: 875.0000 mg | ORAL_TABLET | Freq: Two times a day (BID) | ORAL | 0 refills | Status: DC

## 2016-09-09 NOTE — Patient Instructions (Signed)
Upper Respiratory Infection, Adult Most upper respiratory infections (URIs) are caused by a virus. A URI affects the nose, throat, and upper air passages. The most common type of URI is often called "the common cold." Follow these instructions at home:  Take medicines only as told by your doctor.  Gargle warm saltwater or take cough drops to comfort your throat as told by your doctor.  Use a warm mist humidifier or inhale steam from a shower to increase air moisture. This may make it easier to breathe.  Drink enough fluid to keep your pee (urine) clear or pale yellow.  Eat soups and other clear broths.  Have a healthy diet.  Rest as needed.  Go back to work when your fever is gone or your doctor says it is okay. ? You may need to stay home longer to avoid giving your URI to others. ? You can also wear a face mask and wash your hands often to prevent spread of the virus.  Use your inhaler more if you have asthma.  Do not use any tobacco products, including cigarettes, chewing tobacco, or electronic cigarettes. If you need help quitting, ask your doctor. Contact a doctor if:  You are getting worse, not better.  Your symptoms are not helped by medicine.  You have chills.  You are getting more short of breath.  You have brown or red mucus.  You have yellow or brown discharge from your nose.  You have pain in your face, especially when you bend forward.  You have a fever.  You have puffy (swollen) neck glands.  You have pain while swallowing.  You have white areas in the back of your throat. Get help right away if:  You have very bad or constant: ? Headache. ? Ear pain. ? Pain in your forehead, behind your eyes, and over your cheekbones (sinus pain). ? Chest pain.  You have long-lasting (chronic) lung disease and any of the following: ? Wheezing. ? Long-lasting cough. ? Coughing up blood. ? A change in your usual mucus.  You have a stiff neck.  You have  changes in your: ? Vision. ? Hearing. ? Thinking. ? Mood. This information is not intended to replace advice given to you by your health care provider. Make sure you discuss any questions you have with your health care provider. Document Released: 09/04/2007 Document Revised: 11/19/2015 Document Reviewed: 06/23/2013 Elsevier Interactive Patient Education  2018 [employer]  

## 2016-09-09 NOTE — Progress Notes (Signed)
Subjective:     Patient ID: Eric Stuart, male   DOB: 06-Jan-1956, 61 y.o.   MRN: [id number]  HPI Cough and congestion x 10+ days He has not been checking his BS Using OTC meds for sx  Review of Systems  Constitutional: Positive for activity change, appetite change and fatigue. Negative for fever.  HENT: Positive for congestion, postnasal drip, rhinorrhea, sinus pressure and sore throat.   Respiratory: Positive for cough. Negative for chest tightness, shortness of breath and wheezing.   Cardiovascular: Negative.        Objective:   Physical Exam  Constitutional: He appears well-developed and well-nourished.  HENT:  Right Ear: External ear normal.  Left Ear: External ear normal.  Mouth/Throat: Oropharynx is clear and moist. No oropharyngeal exudate.  Neck: Neck supple.  Cardiovascular: Normal rate, regular rhythm and normal heart sounds.   No murmur heard. Pulmonary/Chest: Breath sounds normal. No respiratory distress. He has no wheezes.  Lymphadenopathy:    He has no cervical adenopathy.  Nursing note and vitals reviewed.      Assessment:     1. Upper respiratory infection, acute        Plan:     Fluids OTC meds for sx Rest Regular f/u for diabetes Amox 875mg  bid x 10 days

## 2016-09-13 ENCOUNTER — Other Ambulatory Visit: Payer: Self-pay | Admitting: Family

## 2016-09-16 ENCOUNTER — Other Ambulatory Visit: Payer: Self-pay | Admitting: Family

## 2016-11-27 ENCOUNTER — Other Ambulatory Visit: Payer: Self-pay | Admitting: Family

## 2016-12-03 ENCOUNTER — Ambulatory Visit: Payer: Medicare Other | Admitting: Family

## 2016-12-04 ENCOUNTER — Encounter: Payer: Self-pay | Admitting: Family

## 2016-12-04 ENCOUNTER — Telehealth: Payer: Self-pay | Admitting: Family

## 2016-12-04 NOTE — Telephone Encounter (Signed)
Left message for patient to call to reschedule an appointment for follow up.

## 2016-12-12 ENCOUNTER — Encounter: Payer: Self-pay | Admitting: Family

## 2016-12-12 ENCOUNTER — Ambulatory Visit (INDEPENDENT_AMBULATORY_CARE_PROVIDER_SITE_OTHER): Payer: Medicare Other | Admitting: Family

## 2016-12-12 VITALS — BP 114/70 | HR 58 | Temp 97.9°F | Ht 61.0 in | Wt 115.0 lb

## 2016-12-12 DIAGNOSIS — E1169 Type 2 diabetes mellitus with other specified complication: Secondary | ICD-10-CM | POA: Diagnosis not present

## 2016-12-12 DIAGNOSIS — E785 Hyperlipidemia, unspecified: Secondary | ICD-10-CM

## 2016-12-12 DIAGNOSIS — I10 Essential (primary) hypertension: Secondary | ICD-10-CM

## 2016-12-12 DIAGNOSIS — E119 Type 2 diabetes mellitus without complications: Secondary | ICD-10-CM

## 2016-12-12 DIAGNOSIS — Z1159 Encounter for screening for other viral diseases: Secondary | ICD-10-CM | POA: Diagnosis not present

## 2016-12-12 LAB — BAYER DCA HB A1C WAIVED: HB A1C: 6.4 % (ref <7.0)

## 2016-12-12 NOTE — Progress Notes (Signed)
Subjective:    Patient ID: Eric Stuart, male    DOB: Aug 16, 1955, 61 y.o.   MRN: [id number]  Pt presents to the office today for chronic follow up.  Diabetes  He presents for his follow-up diabetic visit. He has type 2 diabetes mellitus. His disease course has been stable. Pertinent negatives for hypoglycemia include no headaches. There are no diabetic associated symptoms. Pertinent negatives for diabetes include no blurred vision, no foot paresthesias, no foot ulcerations and no visual change. There are no hypoglycemic complications. Symptoms are stable. Pertinent negatives for diabetic complications include no CVA, heart disease, nephropathy or peripheral neuropathy. Risk factors for coronary artery disease include dyslipidemia and male sex. He is following a diabetic and generally unhealthy diet. His breakfast blood glucose range is generally 110-130 mg/dl. Eye exam is not current.  Hypertension  This is a chronic problem. The current episode started more than 1 year ago. The problem has been resolved since onset. The problem is controlled. Pertinent negatives include no blurred vision, headaches, malaise/fatigue, peripheral edema or shortness of breath. Risk factors for coronary artery disease include dyslipidemia. The current treatment provides moderate improvement. There is no history of kidney disease, CAD/MI, CVA or heart failure.  Hyperlipidemia  This is a chronic problem. The current episode started more than 1 year ago. The problem is controlled. Recent lipid tests were reviewed and are normal. Pertinent negatives include no shortness of breath. Current antihyperlipidemic treatment includes statins. The current treatment provides moderate improvement of lipids. Risk factors for coronary artery disease include dyslipidemia and male sex.      Review of Systems  Constitutional: Negative for malaise/fatigue.  Eyes: Negative for blurred vision.  Respiratory: Negative for shortness of  breath.   Neurological: Negative for headaches.  All other systems reviewed and are negative.      Objective:   Physical Exam  Constitutional: He is oriented to person, place, and time. He appears well-developed and well-nourished. No distress.  HENT:  Head: Normocephalic.  Right Ear: External ear normal.  Left Ear: External ear normal.  Nose: Nose normal.  Mouth/Throat: Oropharynx is clear and moist.  Eyes: Pupils are equal, round, and reactive to light. Right eye exhibits no discharge. Left eye exhibits no discharge.  Neck: Normal range of motion. Neck supple. No thyromegaly present.  Cardiovascular: Normal rate, regular rhythm, normal heart sounds and intact distal pulses.   No murmur heard. Pulmonary/Chest: Effort normal and breath sounds normal. No respiratory distress. He has no wheezes.  Abdominal: Soft. Bowel sounds are normal. He exhibits no distension. There is no tenderness.  Musculoskeletal: Normal range of motion. He exhibits no edema or tenderness.  Neurological: He is alert and oriented to person, place, and time.  Skin: Skin is warm and dry. No rash noted. No erythema.  Psychiatric: He has a normal mood and affect. His behavior is normal. Judgment and thought content normal.  Vitals reviewed.   Diabetic Foot Exam - Simple   Simple Foot Form Diabetic Foot exam was performed with the following findings:  Yes 12/12/2016  8:58 AM  Visual Inspection No deformities, no ulcerations, no other skin breakdown bilaterally:  Yes Sensation Testing Intact to touch and monofilament testing bilaterally:  Yes Pulse Check Posterior Tibialis and Dorsalis pulse intact bilaterally:  Yes Comments      BP 114/70   Pulse (!) 58   Temp 97.9 F (36.6 C) (Oral)   Ht '5\' 1"'  (1.549 m)   Wt 115 lb (52.2 kg)  BMI 21.73 kg/m      Assessment & Plan:  1. Essential hypertension - CMP14+EGFR  2. Type 2 diabetes mellitus without complication, without long-term current use of insulin  ([place]) - [facility] [place] Hb A1c Waived - CMP14+EGFR - Ambulatory referral to Ophthalmology  3. Hyperlipidemia due to type 2 diabetes mellitus ([facility]) - CMP14+EGFR - Lipid panel  4. Need for hepatitis C screening test - CMP14+EGFR - Hepatitis C antibody   Continue all meds Labs pending Health Maintenance reviewed Diet and exercise encouraged RTO 6 months   [name], FNP

## 2016-12-12 NOTE — Patient Instructions (Signed)
Diabetes Mellitus and Food It is important for you to manage your blood sugar (glucose) level. Your blood glucose level can be greatly affected by what you eat. Eating healthier foods in the appropriate amounts throughout the day at about the same time each day will help you control your blood glucose level. It can also help slow or prevent worsening of your diabetes mellitus. Healthy eating may even help you improve the level of your blood pressure and reach or maintain a healthy weight. General recommendations for healthful eating and cooking habits include:  Eating meals and snacks regularly. Avoid going long periods of time without eating to lose weight.  Eating a diet that consists mainly of plant-based foods, such as fruits, vegetables, nuts, legumes, and whole grains.  Using low-heat cooking methods, such as baking, instead of high-heat cooking methods, such as deep frying.  Work with your dietitian to make sure you understand how to use the Nutrition Facts information on food labels. How can food affect me? Carbohydrates Carbohydrates affect your blood glucose level more than any other type of food. Your dietitian will help you determine how many carbohydrates to eat at each meal and teach you how to count carbohydrates. Counting carbohydrates is important to keep your blood glucose at a healthy level, especially if you are using insulin or taking certain medicines for diabetes mellitus. Alcohol Alcohol can cause sudden decreases in blood glucose (hypoglycemia), especially if you use insulin or take certain medicines for diabetes mellitus. Hypoglycemia can be a life-threatening condition. Symptoms of hypoglycemia (sleepiness, dizziness, and disorientation) are similar to symptoms of having too much alcohol. If your health care provider has given you approval to drink alcohol, do so in moderation and use the following guidelines:  Women should not have more than one drink per day, and men  should not have more than two drinks per day. One drink is equal to: ? 12 oz of beer. ? 5 oz of wine. ? 1 oz of hard liquor.  Do not drink on an empty stomach.  Keep yourself hydrated. Have water, diet soda, or unsweetened iced tea.  Regular soda, juice, and other mixers might contain a lot of carbohydrates and should be counted.  What foods are not recommended? As you make food choices, it is important to remember that all foods are not the same. Some foods have fewer nutrients per serving than other foods, even though they might have the same number of calories or carbohydrates. It is difficult to get your body what it needs when you eat foods with fewer nutrients. Examples of foods that you should avoid that are high in calories and carbohydrates but low in nutrients include:  Trans fats (most processed foods list trans fats on the Nutrition Facts label).  Regular soda.  Juice.  Candy.  Sweets, such as cake, pie, doughnuts, and cookies.  Fried foods.  What foods can I eat? Eat nutrient-rich foods, which will nourish your body and keep you healthy. The food you should eat also will depend on several factors, including:  The calories you need.  The medicines you take.  Your weight.  Your blood glucose level.  Your blood pressure level.  Your cholesterol level.  You should eat a variety of foods, including:  Protein. ? Lean cuts of meat. ? Proteins low in saturated fats, such as fish, egg whites, and beans. Avoid processed meats.  Fruits and vegetables. ? Fruits and vegetables that may help control blood glucose levels, such as apples,   mangoes, and yams.  Dairy products. ? Choose fat-free or low-fat dairy products, such as milk, yogurt, and cheese.  Grains, bread, pasta, and rice. ? Choose whole grain products, such as multigrain bread, whole oats, and brown rice. These foods may help control blood pressure.  Fats. ? Foods containing healthful fats, such as  nuts, avocado, olive oil, canola oil, and fish.  Does everyone with diabetes mellitus have the same meal plan? Because every person with diabetes mellitus is different, there is not one meal plan that works for everyone. It is very important that you meet with a dietitian who will help you create a meal plan that is just right for you. This information is not intended to replace advice given to you by your health care provider. Make sure you discuss any questions you have with your health care provider. Document Released: 12/13/2004 Document Revised: 08/24/2015 Document Reviewed: 02/12/2013 Elsevier Interactive Patient Education  2017 [employer] Inc[employer]  

## 2016-12-13 LAB — LIPID PANEL
CHOL/HDL RATIO: 1.8 ratio (ref 0.0–5.0)
Cholesterol, Total: 141 mg/dL (ref 100–199)
HDL: 77 mg/dL (ref >39)
LDL CALC: 51 mg/dL (ref 0–99)
TRIGLYCERIDES: 65 mg/dL (ref 0–149)
VLDL Cholesterol Cal: 13 mg/dL (ref 5–40)

## 2016-12-13 LAB — CMP14+EGFR
A/G RATIO: 2 (ref 1.2–2.2)
ALBUMIN: 4.4 g/dL (ref 3.6–4.8)
ALK PHOS: 42 IU/L (ref 39–117)
ALT: 13 IU/L (ref 0–44)
AST: 22 IU/L (ref 0–40)
BUN / CREAT RATIO: 16 (ref 10–24)
BUN: 22 mg/dL (ref 8–27)
Bilirubin Total: 0.4 mg/dL (ref 0.0–1.2)
CO2: 24 mmol/L (ref 20–29)
Calcium: 9.8 mg/dL (ref 8.6–10.2)
Chloride: 104 mmol/L (ref 96–106)
Creatinine, Ser: 1.39 mg/dL — ABNORMAL HIGH (ref 0.76–1.27)
GFR calc Af Amer: 63 mL/min/{1.73_m2} (ref >59)
GFR, EST NON AFRICAN AMERICAN: 55 mL/min/{1.73_m2} — AB (ref >59)
GLOBULIN, TOTAL: 2.2 g/dL (ref 1.5–4.5)
Glucose: 95 mg/dL (ref 65–99)
POTASSIUM: 4.3 mmol/L (ref 3.5–5.2)
SODIUM: 143 mmol/L (ref 134–144)
Total Protein: 6.6 g/dL (ref 6.0–8.5)

## 2016-12-13 LAB — HEPATITIS C ANTIBODY: Hep C Virus Ab: <0.1 s/co ratio (ref 0.0–0.9)

## 2016-12-16 ENCOUNTER — Other Ambulatory Visit: Payer: Self-pay | Admitting: Family

## 2017-01-01 ENCOUNTER — Other Ambulatory Visit: Payer: Self-pay | Admitting: Family

## 2017-01-02 DIAGNOSIS — Z23 Encounter for immunization: Secondary | ICD-10-CM | POA: Diagnosis not present

## 2017-01-09 DIAGNOSIS — E119 Type 2 diabetes mellitus without complications: Secondary | ICD-10-CM | POA: Diagnosis not present

## 2017-01-09 DIAGNOSIS — Z7984 Long term (current) use of oral hypoglycemic drugs: Secondary | ICD-10-CM | POA: Diagnosis not present

## 2017-01-09 LAB — HM DIABETES EYE EXAM

## 2017-01-16 ENCOUNTER — Encounter: Payer: Self-pay | Admitting: Family

## 2017-01-16 ENCOUNTER — Ambulatory Visit (INDEPENDENT_AMBULATORY_CARE_PROVIDER_SITE_OTHER): Payer: Medicare Other | Admitting: Family

## 2017-01-16 VITALS — BP 126/76 | HR 64 | Temp 96.4°F | Ht 61.0 in | Wt 114.8 lb

## 2017-01-16 DIAGNOSIS — E1169 Type 2 diabetes mellitus with other specified complication: Secondary | ICD-10-CM

## 2017-01-16 DIAGNOSIS — R7989 Other specified abnormal findings of blood chemistry: Secondary | ICD-10-CM

## 2017-01-16 DIAGNOSIS — E785 Hyperlipidemia, unspecified: Secondary | ICD-10-CM

## 2017-01-16 DIAGNOSIS — E119 Type 2 diabetes mellitus without complications: Secondary | ICD-10-CM | POA: Diagnosis not present

## 2017-01-16 DIAGNOSIS — I10 Essential (primary) hypertension: Secondary | ICD-10-CM | POA: Diagnosis not present

## 2017-01-16 NOTE — Progress Notes (Signed)
Subjective:    Patient ID: Eric Stuart, male    DOB: [birth year], 61 y.o.   MRN: [id number]  PT presents to the office today for chronic follow up. Pt states he wants a "check up", but states nothing is bothering him. Pt was seen on 12/12/16 and had lab work, but his creatinine was slightly elevated.  Hypertension  This is a chronic problem. The current episode started more than 1 year ago. The problem has been resolved since onset. The problem is controlled. Pertinent negatives include no blurred vision, malaise/fatigue, peripheral edema or shortness of breath. Risk factors for coronary artery disease include diabetes mellitus, family history and male gender. The current treatment provides moderate improvement. There is no history of CAD/MI, CVA or heart failure.  Diabetes  He presents for his follow-up diabetic visit. He has type 2 diabetes mellitus. There are no hypoglycemic associated symptoms. There are no diabetic associated symptoms. Pertinent negatives for diabetes include no blurred vision, no foot paresthesias and no visual change. Symptoms are stable. Pertinent negatives for diabetic complications include no CVA. His weight is stable. (Does not check BS) Eye exam is not current.  Hyperlipidemia  This is a chronic problem. The current episode started more than 1 year ago. The problem is controlled. Recent lipid tests were reviewed and are normal. Pertinent negatives include no shortness of breath. Current antihyperlipidemic treatment includes statins. The current treatment provides moderate improvement of lipids. Risk factors for coronary artery disease include diabetes mellitus, dyslipidemia, hypertension and male sex.      Review of Systems  Constitutional: Negative for malaise/fatigue.  Eyes: Negative for blurred vision.  Respiratory: Negative for shortness of breath.   All other systems reviewed and are negative.      Objective:   Physical Exam  Constitutional: He is  oriented to person, place, and time. He appears well-developed and well-nourished. No distress.  HENT:  Head: Normocephalic.  Right Ear: External ear normal.  Left Ear: External ear normal.  Nose: Nose normal.  Mouth/Throat: Oropharynx is clear and moist.  Eyes: Pupils are equal, round, and reactive to light. Right eye exhibits no discharge. Left eye exhibits no discharge.  Neck: Normal range of motion. Neck supple. No thyromegaly present.  Cardiovascular: Normal rate, regular rhythm, normal heart sounds and intact distal pulses.   No murmur heard. Pulmonary/Chest: Effort normal and breath sounds normal. No respiratory distress. He has no wheezes.  Abdominal: Soft. Bowel sounds are normal. He exhibits no distension. There is no tenderness.  Musculoskeletal: Normal range of motion. He exhibits no edema or tenderness.  Neurological: He is alert and oriented to person, place, and time.  Skin: Skin is warm and dry. No rash noted. No erythema.  Psychiatric: He has a normal mood and affect. His behavior is normal. Judgment and thought content normal.  Vitals reviewed.     BP 126/76   Pulse 64   Temp (!) 96.4 F (35.8 C)   Ht '5\' 1"'$  (1.549 m)   Wt 114 lb 12.8 oz (52.1 kg)   BMI 21.69 kg/m      Assessment & Plan:  1. Type 2 diabetes mellitus without complication, without long-term current use of insulin ([place]) - Ambulatory referral to Ophthalmology - BMP8+EGFR  2. Essential hypertension - BMP8+EGFR  3. Hyperlipidemia due to type 2 diabetes mellitus ([facility]) - BMP8+EGFR  4. Elevated serum creatinine - BMP8+EGFR   Continue all meds Labs pending Health Maintenance reviewed Diet and exercise encouraged RTO 4 months  [name], FNP

## 2017-01-16 NOTE — Patient Instructions (Signed)
Diabetes Mellitus and Food It is important for you to manage your blood sugar (glucose) level. Your blood glucose level can be greatly affected by what you eat. Eating healthier foods in the appropriate amounts throughout the day at about the same time each day will help you control your blood glucose level. It can also help slow or prevent worsening of your diabetes mellitus. Healthy eating may even help you improve the level of your blood pressure and reach or maintain a healthy weight. General recommendations for healthful eating and cooking habits include:  Eating meals and snacks regularly. Avoid going long periods of time without eating to lose weight.  Eating a diet that consists mainly of plant-based foods, such as fruits, vegetables, nuts, legumes, and whole grains.  Using low-heat cooking methods, such as baking, instead of high-heat cooking methods, such as deep frying.  Work with your dietitian to make sure you understand how to use the Nutrition Facts information on food labels. How can food affect me? Carbohydrates Carbohydrates affect your blood glucose level more than any other type of food. Your dietitian will help you determine how many carbohydrates to eat at each meal and teach you how to count carbohydrates. Counting carbohydrates is important to keep your blood glucose at a healthy level, especially if you are using insulin or taking certain medicines for diabetes mellitus. Alcohol Alcohol can cause sudden decreases in blood glucose (hypoglycemia), especially if you use insulin or take certain medicines for diabetes mellitus. Hypoglycemia can be a life-threatening condition. Symptoms of hypoglycemia (sleepiness, dizziness, and disorientation) are similar to symptoms of having too much alcohol. If your health care provider has given you approval to drink alcohol, do so in moderation and use the following guidelines:  Women should not have more than one drink per day, and men  should not have more than two drinks per day. One drink is equal to: ? 12 oz of beer. ? 5 oz of wine. ? 1 oz of hard liquor.  Do not drink on an empty stomach.  Keep yourself hydrated. Have water, diet soda, or unsweetened iced tea.  Regular soda, juice, and other mixers might contain a lot of carbohydrates and should be counted.  What foods are not recommended? As you make food choices, it is important to remember that all foods are not the same. Some foods have fewer nutrients per serving than other foods, even though they might have the same number of calories or carbohydrates. It is difficult to get your body what it needs when you eat foods with fewer nutrients. Examples of foods that you should avoid that are high in calories and carbohydrates but low in nutrients include:  Trans fats (most processed foods list trans fats on the Nutrition Facts label).  Regular soda.  Juice.  Candy.  Sweets, such as cake, pie, doughnuts, and cookies.  Fried foods.  What foods can I eat? Eat nutrient-rich foods, which will nourish your body and keep you healthy. The food you should eat also will depend on several factors, including:  The calories you need.  The medicines you take.  Your weight.  Your blood glucose level.  Your blood pressure level.  Your cholesterol level.  You should eat a variety of foods, including:  Protein. ? Lean cuts of meat. ? Proteins low in saturated fats, such as fish, egg whites, and beans. Avoid processed meats.  Fruits and vegetables. ? Fruits and vegetables that may help control blood glucose levels, such as apples,   mangoes, and yams.  Dairy products. ? Choose fat-free or low-fat dairy products, such as milk, yogurt, and cheese.  Grains, bread, pasta, and rice. ? Choose whole grain products, such as multigrain bread, whole oats, and brown rice. These foods may help control blood pressure.  Fats. ? Foods containing healthful fats, such as  nuts, avocado, olive oil, canola oil, and fish.  Does everyone with diabetes mellitus have the same meal plan? Because every person with diabetes mellitus is different, there is not one meal plan that works for everyone. It is very important that you meet with a dietitian who will help you create a meal plan that is just right for you. This information is not intended to replace advice given to you by your health care provider. Make sure you discuss any questions you have with your health care provider. Document Released: 12/13/2004 Document Revised: 08/24/2015 Document Reviewed: 02/12/2013 Elsevier Interactive Patient Education  2017 [employer] Inc[employer]  

## 2017-01-17 LAB — BMP8+EGFR
BUN/Creatinine Ratio: 20 (ref 10–24)
BUN: 26 mg/dL (ref 8–27)
CO2: 25 mmol/L (ref 20–29)
CREATININE: 1.32 mg/dL — AB (ref 0.76–1.27)
Calcium: 9.6 mg/dL (ref 8.6–10.2)
Chloride: 102 mmol/L (ref 96–106)
GFR calc Af Amer: 67 mL/min/{1.73_m2} (ref >59)
GFR calc non Af Amer: 58 mL/min/{1.73_m2} — ABNORMAL LOW (ref >59)
GLUCOSE: 99 mg/dL (ref 65–99)
POTASSIUM: 4.5 mmol/L (ref 3.5–5.2)
SODIUM: 141 mmol/L (ref 134–144)

## 2017-01-28 ENCOUNTER — Other Ambulatory Visit: Payer: Self-pay | Admitting: Family

## 2017-01-30 ENCOUNTER — Ambulatory Visit (INDEPENDENT_AMBULATORY_CARE_PROVIDER_SITE_OTHER): Payer: Medicare Other | Admitting: *Deleted

## 2017-01-30 ENCOUNTER — Encounter: Payer: Self-pay | Admitting: *Deleted

## 2017-01-30 VITALS — BP 115/73 | HR 65 | Ht 61.0 in | Wt 116.0 lb

## 2017-01-30 DIAGNOSIS — Z0000 Encounter for general adult medical examination without abnormal findings: Secondary | ICD-10-CM

## 2017-01-30 NOTE — Patient Instructions (Signed)
  [name] , Thank you for taking time to come for your Medicare Wellness Visit. I appreciate your ongoing commitment to your health goals. Please review the following plan we discussed and let me know if I can assist you in the future.   These are the goals we discussed: Goals    . Exercise 150 minutes per week (moderate activity)       This is a list of the screening recommended for you and due dates:  Health Maintenance  Topic Date Due  . Eye exam for diabetics  08/07/2016  . Hemoglobin A1C  06/11/2017  . Complete foot exam   12/12/2017  . Pneumococcal vaccine (2) 04/27/2019  . Colon Cancer Screening  04/04/2020  . Tetanus Vaccine  03/02/2023  . Flu Shot  Completed  .  Hepatitis C: One time screening is recommended by Center for Disease Control  (CDC) for  adults born from [birth year] through 1964.   Completed  . HIV Screening  Completed

## 2017-01-30 NOTE — Progress Notes (Signed)
Subjective:   Eric Stuart is a 61 y.o. male who presents for an Initial Medicare Annual Wellness Visit. Eric Stuart lives with his sister. She is present at his visit today.   Review of Systems  Health is about the same as last year.   Cardiac Risk Factors include: advanced age (>38men, >73 women);diabetes mellitus;dyslipidemia;male gender;hypertension  Sister is concerned about potential weight loss. She has noticed that his face looks thinner. His weight is up to pounds from his last visit a couple of weeks ago and is the same as it was at another visit 5 months ago. Patient reports that he's feeling fine. Sister states that he was diagnosed with sarcoidosis in 83. Initial CXR was suspicious for lung cancer but later diagnosed as sarcoidosis. He has not been followed for this and doesn't have any respiratory complaints.     Other systems negative.  Objective:    Today's Vitals   01/30/17 1528  BP: 115/73  Pulse: 65  Weight: 116 lb (52.6 kg)  Height: 5\' 1"  (1.549 m)   Body mass index is 21.92 kg/m.  Current Medications (verified) Outpatient Encounter Prescriptions as of 01/30/2017  Medication Sig  . aspirin 81 MG tablet Take 81 mg by mouth daily.  . metFORMIN (GLUCOPHAGE) 1000 MG tablet TAKE 1 TABLET (1,000 MG TOTAL) BY MOUTH 2 (TWO) TIMES DAILY WITH A MEAL.  . Olmesartan-Amlodipine-HCTZ 40-10-25 MG TABS TAKE 1 TABLET BY MOUTH DAILY.  . rosuvastatin (CRESTOR) 10 MG tablet TAKE 1 TABLET EVERY EVENING  . [DISCONTINUED] rosuvastatin (CRESTOR) 10 MG tablet TAKE 1 TABLET EVERY EVENING   No facility-administered encounter medications on file as of 01/30/2017.     Allergies (verified) Patient has no known allergies.   History: Past Medical History:  Diagnosis Date  . Diabetes mellitus without complication ([place])   . Hyperlipidemia   . Hypertension   . Sarcoidosis 1985   Sister provided    Past Surgical History:  Procedure Laterality Date  . ADENOIDECTOMY  childhod  .  TONSILLECTOMY     Family History  Problem Relation Age of Onset  . Cancer Mother        cervical  . Peripheral Artery Disease Father   . Heart failure Father   . Diabetes Sister   . Heart attack Brother   . Heart attack Sister   . Stroke Sister    Social History   Occupational History  . Not on file.   Social History Main Topics  . Smoking status: Former Smoker    Packs/day: 2.00    Years: 4.00    Quit date: 09/29/2013  . Smokeless tobacco: Never Used  . Alcohol use No  . Drug use: No  . Sexual activity: Not on file   Tobacco Counseling No tobacco use.   Activities of Daily Living In your present state of health, do you have any difficulty performing the following activities: 01/30/2017  Hearing? Y  Comment Sister says that he has always had trouble with his hearing. Had a lot of ear infections as a child.   Vision? N  Comment Recent eye exam  Difficulty concentrating or making decisions? Y  Comment sister has noticed more of a problem with memory recently.   Walking or climbing stairs? N  Dressing or bathing? N  Doing errands, shopping? N  Preparing Food and eating ? N  Using the Toilet? N  In the past six months, have you accidently leaked urine? N  Do you have problems with loss  of bowel control? N  Managing your Medications? N  Managing your Finances? N  Housekeeping or managing your Housekeeping? N  Some recent data might be hidden    Immunizations and Health Maintenance Immunization History  Administered Date(s) Administered  . Influenza Split 02/26/2016, 01/02/2017  . Influenza,inj,Quad PF,6+ Mos 03/01/2013, 12/29/2013  . Pneumococcal Conjugate-13 04/26/2014  . Tdap 03/01/2013   Health Maintenance Due  Topic Date Due  . OPHTHALMOLOGY EXAM  08/07/2016    Patient Care Team: [name], FNP as PCP - General (Nurse Practitioner) Melina Schools, OD (Optometry)  No ER visits, surgeries, or hospitalizations this past year.     Assessment:    This is a routine wellness examination for Eric Stuart.   Hearing/Vision screen No exam data present  Dietary issues and exercise activities discussed: Current Exercise Habits: Home exercise routine, Type of exercise: walking;Other - see comments (Bicycle for 3 to 4 hours a day), Time (Minutes): > 60, Frequency (Times/Week): >7, Weekly Exercise (Minutes/Week): 0, Intensity: Moderate, Exercise limited by: None identified Patient walks frequently and bikes about 50 miles a week. His sister estimates that this is 3-4 hours a day.   Diet: Eats 3 meals a day. Cereal for breakfast, light lunch, and a full supper with 2 vegetables, meat/protein, and a bread. His sister prepares their meals.   Goals    . Exercise 150 minutes per week (moderate activity)      Depression Screen PHQ 2/9 Scores 01/30/2017 01/16/2017 12/12/2016 09/09/2016  PHQ - 2 Score 0 0 0 0    Fall Risk Fall Risk  01/30/2017 01/16/2017 12/12/2016 09/09/2016 05/30/2016  Falls in the past year? No No No No No    Cognitive Function:  If completed score would have been inaccurate due to the patient's level of comprehension.      Screening Tests Health Maintenance  Topic Date Due  . OPHTHALMOLOGY EXAM  08/07/2016  . HEMOGLOBIN A1C  06/11/2017  . FOOT EXAM  12/12/2017  . PNEUMOCOCCAL POLYSACCHARIDE VACCINE (2) 04/27/2019  . COLONOSCOPY  04/04/2020  . TETANUS/TDAP  03/02/2023  . INFLUENZA VACCINE  Completed  . Hepatitis C Screening  Completed  . HIV Screening  Completed  Eye exam was in Oct 2018.      Plan:  Will request eye exam report.  Continue to stay active.  Keep f/u with PCP in March. F/u sooner for weight concerns if necessary.    I have personally reviewed and noted the following in the patient's chart:   . Medical and social history . Use of alcohol, tobacco or illicit drugs  . Current medications and supplements . Functional ability and status . Nutritional status . Physical activity . Advanced  directives . List of other physicians . Hospitalizations, surgeries, and ER visits in previous 12 months . Vitals . Screenings to include cognitive, depression, and falls . Referrals and appointments  In addition, I have reviewed and discussed with patient certain preventive protocols, quality metrics, and best practice recommendations. A written personalized care plan for preventive services as well as general preventive health recommendations were provided to patient.     Chong Sicilian, RN   01/30/2017   I have reviewed and agree with the above AWV documentation.   [name], FNP

## 2017-04-28 ENCOUNTER — Ambulatory Visit: Payer: Medicare Other

## 2017-06-11 ENCOUNTER — Ambulatory Visit (INDEPENDENT_AMBULATORY_CARE_PROVIDER_SITE_OTHER): Payer: Medicare Other

## 2017-06-11 ENCOUNTER — Ambulatory Visit (INDEPENDENT_AMBULATORY_CARE_PROVIDER_SITE_OTHER): Payer: Medicare Other | Admitting: Family

## 2017-06-11 ENCOUNTER — Encounter: Payer: Self-pay | Admitting: Family

## 2017-06-11 ENCOUNTER — Other Ambulatory Visit: Payer: Self-pay | Admitting: Family

## 2017-06-11 VITALS — BP 120/85 | HR 62 | Temp 96.8°F | Ht 61.0 in | Wt 115.8 lb

## 2017-06-11 DIAGNOSIS — D869 Sarcoidosis, unspecified: Secondary | ICD-10-CM

## 2017-06-11 DIAGNOSIS — E1169 Type 2 diabetes mellitus with other specified complication: Secondary | ICD-10-CM | POA: Diagnosis not present

## 2017-06-11 DIAGNOSIS — I10 Essential (primary) hypertension: Secondary | ICD-10-CM

## 2017-06-11 DIAGNOSIS — I152 Hypertension secondary to endocrine disorders: Secondary | ICD-10-CM

## 2017-06-11 DIAGNOSIS — E1159 Type 2 diabetes mellitus with other circulatory complications: Secondary | ICD-10-CM

## 2017-06-11 DIAGNOSIS — E785 Hyperlipidemia, unspecified: Secondary | ICD-10-CM

## 2017-06-11 DIAGNOSIS — E119 Type 2 diabetes mellitus without complications: Secondary | ICD-10-CM

## 2017-06-11 DIAGNOSIS — R351 Nocturia: Secondary | ICD-10-CM | POA: Diagnosis not present

## 2017-06-11 LAB — BAYER DCA HB A1C WAIVED: HB A1C (BAYER DCA - WAIVED): 6.2 % (ref <7.0)

## 2017-06-11 NOTE — Patient Instructions (Addendum)
Diabetes Mellitus and Nutrition When you have diabetes (diabetes mellitus), it is very important to have healthy eating habits because your blood sugar (glucose) levels are greatly affected by what you eat and drink. Eating healthy foods in the appropriate amounts, at about the same times every day, can help you:  Control your blood glucose.  Lower your risk of heart disease.  Improve your blood pressure.  Reach or maintain a healthy weight.  Every person with diabetes is different, and each person has different needs for a meal plan. Your health care provider may recommend that you work with a diet and nutrition specialist (dietitian) to make a meal plan that is best for you. Your meal plan may vary depending on factors such as:  The calories you need.  The medicines you take.  Your weight.  Your blood glucose, blood pressure, and cholesterol levels.  Your activity level.  Other health conditions you have, such as heart or kidney disease.  How do carbohydrates affect me? Carbohydrates affect your blood glucose level more than any other type of food. Eating carbohydrates naturally increases the amount of glucose in your blood. Carbohydrate counting is a method for keeping track of how many carbohydrates you eat. Counting carbohydrates is important to keep your blood glucose at a healthy level, especially if you use insulin or take certain oral diabetes medicines. It is important to know how many carbohydrates you can safely have in each meal. This is different for every person. Your dietitian can help you calculate how many carbohydrates you should have at each meal and for snack. Foods that contain carbohydrates include:  Bread, cereal, rice, pasta, and crackers.  Potatoes and corn.  Peas, beans, and lentils.  Milk and yogurt.  Fruit and juice.  Desserts, such as cakes, cookies, ice cream, and candy.  How does alcohol affect me? Alcohol can cause a sudden decrease in blood  glucose (hypoglycemia), especially if you use insulin or take certain oral diabetes medicines. Hypoglycemia can be a life-threatening condition. Symptoms of hypoglycemia (sleepiness, dizziness, and confusion) are similar to symptoms of having too much alcohol. If your health care provider says that alcohol is safe for you, follow these guidelines:  Limit alcohol intake to no more than 1 drink per day for nonpregnant women and 2 drinks per day for men. One drink equals 12 oz of beer, 5 oz of wine, or 1 oz of hard liquor.  Do not drink on an empty stomach.  Keep yourself hydrated with water, diet soda, or unsweetened iced tea.  Keep in mind that regular soda, juice, and other mixers may contain a lot of sugar and must be counted as carbohydrates.  What are tips for following this plan? Reading food labels  Start by checking the serving size on the label. The amount of calories, carbohydrates, fats, and other nutrients listed on the label are based on one serving of the food. Many foods contain more than one serving per package.  Check the total grams (g) of carbohydrates in one serving. You can calculate the number of servings of carbohydrates in one serving by dividing the total carbohydrates by 15. For example, if a food has 30 g of total carbohydrates, it would be equal to 2 servings of carbohydrates.  Check the number of grams (g) of saturated and trans fats in one serving. Choose foods that have low or no amount of these fats.  Check the number of milligrams (mg) of sodium in one serving. Most people   should limit total sodium intake to less than 2,300 mg per day.  Always check the nutrition information of foods labeled as "low-fat" or "nonfat". These foods may be higher in added sugar or refined carbohydrates and should be avoided.  Talk to your dietitian to identify your daily goals for nutrients listed on the label. Shopping  Avoid buying canned, premade, or processed foods. These  foods tend to be high in fat, sodium, and added sugar.  Shop around the outside edge of the grocery store. This includes fresh fruits and vegetables, bulk grains, fresh meats, and fresh dairy. Cooking  Use low-heat cooking methods, such as baking, instead of high-heat cooking methods like deep frying.  Cook using healthy oils, such as olive, canola, or sunflower oil.  Avoid cooking with butter, cream, or high-fat meats. Meal planning  Eat meals and snacks regularly, preferably at the same times every day. Avoid going long periods of time without eating.  Eat foods high in fiber, such as fresh fruits, vegetables, beans, and whole grains. Talk to your dietitian about how many servings of carbohydrates you can eat at each meal.  Eat 4-6 ounces of lean protein each day, such as lean meat, chicken, fish, eggs, or tofu. 1 ounce is equal to 1 ounce of meat, chicken, or fish, 1 egg, or 1/4 cup of tofu.  Eat some foods each day that contain healthy fats, such as avocado, nuts, seeds, and fish. Lifestyle   Check your blood glucose regularly.  Exercise at least 30 minutes 5 or more days each week, or as told by your health care provider.  Take medicines as told by your health care provider.  Do not use any products that contain nicotine or tobacco, such as cigarettes and e-cigarettes. If you need help quitting, ask your health care provider.  Work with a counselor or diabetes educator to identify strategies to manage stress and any emotional and social challenges. What are some questions to ask my health care provider?  Do I need to meet with a diabetes educator?  Do I need to meet with a dietitian?  What number can I call if I have questions?  When are the best times to check my blood glucose? Where to find more information:  American Diabetes Association: diabetes.org/food-and-fitness/food  Academy of Nutrition and Dietetics:  www.eatright.org/resources/health/diseases-and-conditions/diabetes  National Institute of Diabetes and Digestive and Kidney Diseases (NIH): www.niddk.nih.gov/health-information/diabetes/overview/diet-eating-physical-activity Summary  A healthy meal plan will help you control your blood glucose and maintain a healthy lifestyle.  Working with a diet and nutrition specialist (dietitian) can help you make a meal plan that is best for you.  Keep in mind that carbohydrates and alcohol have immediate effects on your blood glucose levels. It is important to count carbohydrates and to use alcohol carefully. This information is not intended to replace advice given to you by your health care provider. Make sure you discuss any questions you have with your health care provider. Document Released: 12/13/2004 Document Revised: 04/22/2016 Document Reviewed: 04/22/2016 Elsevier Interactive Patient Education  2018 Elsevier Inc.  

## 2017-06-11 NOTE — Progress Notes (Signed)
re

## 2017-06-11 NOTE — Progress Notes (Signed)
Subjective:    Patient ID: Eric Stuart, male    DOB: Jan 02, 1956, 62 y.o.   MRN: [id number]  Pt presents to the office today for chronic follow up. Pt has hx sarcoidosis.  Hypertension  This is a chronic problem. The current episode started more than 1 year ago. The problem has been resolved since onset. The problem is controlled. Pertinent negatives include no blurred vision, malaise/fatigue, peripheral edema or shortness of breath. Risk factors for coronary artery disease include dyslipidemia, obesity and male gender. The current treatment provides moderate improvement. There is no history of kidney disease, CAD/MI, CVA, heart failure or left ventricular hypertrophy.  Diabetes  He presents for his follow-up diabetic visit. He has type 2 diabetes mellitus. His disease course has been stable. There are no hypoglycemic associated symptoms. Pertinent negatives for diabetes include no blurred vision, no foot paresthesias and no visual change. Symptoms are stable. Pertinent negatives for diabetic complications include no CVA, heart disease, nephropathy or peripheral neuropathy. Risk factors for coronary artery disease include dyslipidemia, diabetes mellitus, male sex and hypertension. He is following a generally healthy diet. He participates in exercise daily. (Does not check BS at home ) Eye exam is current.  Hyperlipidemia  This is a chronic problem. The current episode started more than 1 year ago. The problem is controlled. Recent lipid tests were reviewed and are normal. Pertinent negatives include no shortness of breath. Current antihyperlipidemic treatment includes statins. The current treatment provides moderate improvement of lipids. Risk factors for coronary artery disease include dyslipidemia, male sex, hypertension and diabetes mellitus.      Review of Systems  Constitutional: Negative for malaise/fatigue.  Eyes: Negative for blurred vision.  Respiratory: Negative for shortness of  breath.   All other systems reviewed and are negative.      Objective:   Physical Exam  Constitutional: He is oriented to person, place, and time. He appears well-developed and well-nourished. No distress.  HENT:  Head: Normocephalic.  Right Ear: External ear normal.  Left Ear: External ear normal.  Nose: Nose normal.  Mouth/Throat: Oropharynx is clear and moist.  Eyes: Pupils are equal, round, and reactive to light. Right eye exhibits no discharge. Left eye exhibits no discharge.  Neck: Normal range of motion. Neck supple. No thyromegaly present.  Cardiovascular: Normal rate, regular rhythm, normal heart sounds and intact distal pulses.  No murmur heard. Pulmonary/Chest: Effort normal. No respiratory distress. He has decreased breath sounds. He has no wheezes.  Abdominal: Soft. Bowel sounds are normal. He exhibits no distension. There is no tenderness.  Musculoskeletal: Normal range of motion. He exhibits no edema or tenderness.  Neurological: He is alert and oriented to person, place, and time.  Skin: Skin is warm and dry. No rash noted. No erythema.  Psychiatric: He has a normal mood and affect. His behavior is normal. Judgment and thought content normal.  Vitals reviewed.    BP 120/85   Pulse 62   Temp (!) 96.8 F (36 C) (Oral)   Ht '5\' 1"'  (1.549 m)   Wt 115 lb 12.8 oz (52.5 kg)   BMI 21.88 kg/m      Assessment & Plan:  1. Type 2 diabetes mellitus without complication, without long-term current use of insulin (HCC) - Bayer DCA Hb A1c Waived - CMP14+EGFR - Microalbumin / creatinine urine ratio  2. Hyperlipidemia due to type 2 diabetes mellitus (HCC) - CMP14+EGFR - Lipid panel  3. Hypertension associated with diabetes ([place]) - CMP14+EGFR  4. Sarcoidosis -  CMP14+EGFR - DG Chest 2 View; Future  5. Nocturia  - CMP14+EGFR - PSA, total and free   Continue all meds Labs pending Health Maintenance reviewed Diet and exercise encouraged RTO 6 months   [name], FNP

## 2017-06-11 NOTE — Telephone Encounter (Signed)
Last seen 01/16/17  [facility]

## 2017-06-12 ENCOUNTER — Telehealth: Payer: Self-pay | Admitting: Family

## 2017-06-12 LAB — CMP14+EGFR
ALT: 10 IU/L (ref 0–44)
AST: 19 IU/L (ref 0–40)
Albumin/Globulin Ratio: 1.6 (ref 1.2–2.2)
Albumin: 4.4 g/dL (ref 3.6–4.8)
Alkaline Phosphatase: 48 IU/L (ref 39–117)
BUN/Creatinine Ratio: 18 (ref 10–24)
BUN: 24 mg/dL (ref 8–27)
Bilirubin Total: 0.5 mg/dL (ref 0.0–1.2)
CALCIUM: 9.6 mg/dL (ref 8.6–10.2)
CO2: 24 mmol/L (ref 20–29)
CREATININE: 1.32 mg/dL — AB (ref 0.76–1.27)
Chloride: 102 mmol/L (ref 96–106)
GFR calc Af Amer: 67 mL/min/{1.73_m2} (ref >59)
GFR, EST NON AFRICAN AMERICAN: 58 mL/min/{1.73_m2} — AB (ref >59)
GLOBULIN, TOTAL: 2.7 g/dL (ref 1.5–4.5)
Glucose: 77 mg/dL (ref 65–99)
Potassium: 4.5 mmol/L (ref 3.5–5.2)
SODIUM: 140 mmol/L (ref 134–144)
Total Protein: 7.1 g/dL (ref 6.0–8.5)

## 2017-06-12 LAB — MICROALBUMIN / CREATININE URINE RATIO
Creatinine, Urine: 120.9 mg/dL
MICROALB/CREAT RATIO: 6.4 mg/g{creat} (ref 0.0–30.0)
Microalbumin, Urine: 7.7 ug/mL

## 2017-06-12 LAB — LIPID PANEL
CHOL/HDL RATIO: 2 ratio (ref 0.0–5.0)
Cholesterol, Total: 150 mg/dL (ref 100–199)
HDL: 75 mg/dL (ref >39)
LDL CALC: 63 mg/dL (ref 0–99)
TRIGLYCERIDES: 59 mg/dL (ref 0–149)
VLDL Cholesterol Cal: 12 mg/dL (ref 5–40)

## 2017-06-12 LAB — PSA, TOTAL AND FREE
PSA, Free Pct: 38.3 %
PSA, Free: 0.23 ng/mL
Prostate Specific Ag, Serum: 0.6 ng/mL (ref 0.0–4.0)

## 2017-06-12 NOTE — Telephone Encounter (Signed)
lmtcb

## 2017-06-12 NOTE — Telephone Encounter (Signed)
Refer to lab note °

## 2017-06-12 NOTE — Telephone Encounter (Signed)
Aware. 

## 2017-06-16 ENCOUNTER — Other Ambulatory Visit: Payer: Self-pay | Admitting: *Deleted

## 2017-06-16 MED ORDER — ROSUVASTATIN CALCIUM 10 MG PO TABS: 10.0000 mg | ORAL_TABLET | Freq: Every evening | ORAL | 1 refills | Status: DC

## 2017-06-24 ENCOUNTER — Other Ambulatory Visit: Payer: Self-pay | Admitting: Family

## 2017-06-27 ENCOUNTER — Encounter: Payer: Self-pay | Admitting: Family

## 2017-06-27 ENCOUNTER — Ambulatory Visit (INDEPENDENT_AMBULATORY_CARE_PROVIDER_SITE_OTHER): Payer: Medicare Other | Admitting: Family

## 2017-06-27 VITALS — BP 122/86 | HR 68 | Temp 96.7°F | Ht 61.0 in | Wt 114.2 lb

## 2017-06-27 DIAGNOSIS — M79645 Pain in left finger(s): Secondary | ICD-10-CM | POA: Diagnosis not present

## 2017-06-27 NOTE — Progress Notes (Signed)
   Subjective:    Patient ID: Eric Stuart, male    DOB: Nov 08, 1955, 62 y.o.   MRN: [id number]  HPI PT presents to the office today with complaints left thumb that started last night. He reports the pain occurred while he was laying in bed and was aching pain of 10 out 10. States the pain has resolved now. He has not taken anything for the pain.    Review of Systems  All other systems reviewed and are negative.      Objective:   Physical Exam  Constitutional: He is oriented to person, place, and time. He appears well-developed and well-nourished. No distress.  HENT:  Head: Normocephalic.  Right Ear: External ear normal.  Left Ear: External ear normal.  Nose: Nose normal.  Mouth/Throat: Oropharynx is clear and moist.  Eyes: Pupils are equal, round, and reactive to light. Right eye exhibits no discharge. Left eye exhibits no discharge.  Neck: Normal range of motion. Neck supple. No thyromegaly present.  Cardiovascular: Normal rate, regular rhythm, normal heart sounds and intact distal pulses.  No murmur heard. Pulmonary/Chest: Effort normal and breath sounds normal. No respiratory distress. He has no wheezes.  Abdominal: Soft. Bowel sounds are normal. He exhibits no distension. There is no tenderness.  Musculoskeletal: Normal range of motion. He exhibits no edema or tenderness.  Neurological: He is alert and oriented to person, place, and time.  Skin: Skin is warm and dry. No rash noted. No erythema.  Psychiatric: He has a normal mood and affect. His behavior is normal. Judgment and thought content normal.  Vitals reviewed.     BP 122/86   Pulse 68   Temp (!) 96.7 F (35.9 C) (Oral)   Ht 5\' 1"  (1.549 m)   Wt 114 lb 3.2 oz (51.8 kg)   BMI 21.58 kg/m      Assessment & Plan:  1. Pain of left thumb Resolved Tylenol or Motrin as needed RTO prn    [name], FNP

## 2017-06-27 NOTE — Patient Instructions (Signed)

## 2017-11-03 ENCOUNTER — Ambulatory Visit: Payer: Medicare Other | Admitting: *Deleted

## 2017-11-10 ENCOUNTER — Other Ambulatory Visit: Payer: Self-pay | Admitting: Family

## 2017-11-10 NOTE — Telephone Encounter (Signed)
Last seen 06/27/17

## 2017-12-15 DIAGNOSIS — Z23 Encounter for immunization: Secondary | ICD-10-CM | POA: Diagnosis not present

## 2017-12-16 ENCOUNTER — Other Ambulatory Visit: Payer: Self-pay | Admitting: Family

## 2017-12-29 ENCOUNTER — Encounter: Payer: Self-pay | Admitting: Family

## 2017-12-29 ENCOUNTER — Ambulatory Visit (INDEPENDENT_AMBULATORY_CARE_PROVIDER_SITE_OTHER): Payer: Medicare Other | Admitting: Family

## 2017-12-29 VITALS — BP 114/76 | HR 61 | Temp 97.0°F | Ht 61.0 in | Wt 109.4 lb

## 2017-12-29 DIAGNOSIS — E1169 Type 2 diabetes mellitus with other specified complication: Secondary | ICD-10-CM | POA: Diagnosis not present

## 2017-12-29 DIAGNOSIS — I10 Essential (primary) hypertension: Secondary | ICD-10-CM | POA: Diagnosis not present

## 2017-12-29 DIAGNOSIS — M19042 Primary osteoarthritis, left hand: Secondary | ICD-10-CM

## 2017-12-29 DIAGNOSIS — E1159 Type 2 diabetes mellitus with other circulatory complications: Secondary | ICD-10-CM

## 2017-12-29 DIAGNOSIS — E785 Hyperlipidemia, unspecified: Secondary | ICD-10-CM

## 2017-12-29 MED ORDER — MELOXICAM 15 MG PO TABS: 15.0000 mg | ORAL_TABLET | Freq: Every day | ORAL | 0 refills | Status: DC

## 2017-12-29 NOTE — Progress Notes (Signed)
Subjective:    Patient ID: Eric Stuart, male    DOB: 08-15-1955, 62 y.o.   MRN: [id number]  Chief Complaint  Patient presents with  . Medical Management of Chronic Issues    six month recheck  . Diabetes   Pt presents to the office today for chronic follow up. Pt has hx sarcoidosis. Diabetes  He presents for his follow-up diabetic visit. He has type 2 diabetes mellitus. His disease course has been stable. There are no hypoglycemic associated symptoms. Pertinent negatives for diabetes include no blurred vision, no foot paresthesias and no visual change. Symptoms are stable. Pertinent negatives for diabetic complications include no CVA, heart disease, nephropathy or peripheral neuropathy. Risk factors for coronary artery disease include diabetes mellitus, dyslipidemia, male sex, hypertension and sedentary lifestyle. (Does not check BS at home ) Eye exam is current.  Hypertension  This is a chronic problem. The current episode started more than 1 year ago. The problem has been resolved since onset. The problem is controlled. Pertinent negatives include no blurred vision, peripheral edema or shortness of breath. Risk factors for coronary artery disease include dyslipidemia, diabetes mellitus and male gender. There is no history of kidney disease, CAD/MI or CVA.  Hyperlipidemia  This is a chronic problem. The current episode started more than 1 year ago. The problem is controlled. Recent lipid tests were reviewed and are normal. Pertinent negatives include no shortness of breath. Current antihyperlipidemic treatment includes statins. The current treatment provides moderate improvement of lipids. Risk factors for coronary artery disease include dyslipidemia, diabetes mellitus, male sex and hypertension.  Hand Pain   The incident occurred more than 1 week ago. There was no injury mechanism. Pain location: left thumb. The quality of the pain is described as aching. The pain does not radiate. The pain  is mild. The pain has been intermittent since the incident. Pertinent negatives include no numbness or tingling. Nothing aggravates the symptoms. He has tried acetaminophen for the symptoms. The treatment provided mild relief.      Review of Systems  Eyes: Negative for blurred vision.  Respiratory: Negative for shortness of breath.   Neurological: Negative for tingling and numbness.  All other systems reviewed and are negative.      Objective:   Physical Exam  Constitutional: He is oriented to person, place, and time. He appears well-developed and well-nourished. No distress.  HENT:  Head: Normocephalic.  Right Ear: External ear normal.  Left Ear: External ear normal.  Mouth/Throat: Oropharynx is clear and moist.  Eyes: Pupils are equal, round, and reactive to light. Right eye exhibits no discharge. Left eye exhibits no discharge.  Neck: Normal range of motion. Neck supple. No thyromegaly present.  Cardiovascular: Normal rate, regular rhythm, normal heart sounds and intact distal pulses.  No murmur heard. Pulmonary/Chest: Effort normal and breath sounds normal. No respiratory distress. He has no wheezes.  Abdominal: Soft. Bowel sounds are normal. He exhibits no distension. There is no tenderness.  Musculoskeletal: Normal range of motion. He exhibits tenderness. He exhibits no edema.  Full ROM of left thumb, but mild tenderness with flexion  Neurological: He is alert and oriented to person, place, and time. He has normal reflexes. No cranial nerve deficit.  Skin: Skin is warm and dry. No rash noted. No erythema.  Psychiatric: He has a normal mood and affect. His behavior is normal. Judgment and thought content normal.  Vitals reviewed.     BP 114/76   Pulse 61   Temp (!)  97 F (36.1 C) (Oral)   Ht _0  (1.549 m)   Wt 109 lb 6.4 oz (49.6 kg)   BMI 20.67 kg/m      Assessment & Plan:  Eric Stuart comes in today with chief complaint of Medical Management of Chronic  Issues (six month recheck) and Diabetes   Diagnosis and orders addressed:  1. Type 2 diabetes mellitus with other specified complication, without long-term current use of insulin (HCC) - CMP14+EGFR - CBC with Differential/Platelet - Bayer DCA Hb A1c Waived  2. Hyperlipidemia due to type 2 diabetes mellitus ([place]) - CMP14+EGFR - CBC with Differential/Platelet - Lipid panel  3. Hypertension associated with diabetes ([place]) - CMP14+EGFR - CBC with Differential/Platelet  4. Primary osteoarthritis of left hand Will start Mobic No other NSAID's - CMP14+EGFR - CBC with Differential/Platelet - meloxicam (MOBIC) 15 MG tablet; Take 1 tablet (15 mg total) by mouth daily.  Dispense: 30 tablet; Refill: 0   Labs pending Health Maintenance reviewed Diet and exercise encouraged  Follow up plan:   months  [name], FNP

## 2017-12-29 NOTE — Patient Instructions (Signed)

## 2018-01-16 DIAGNOSIS — Z7984 Long term (current) use of oral hypoglycemic drugs: Secondary | ICD-10-CM | POA: Diagnosis not present

## 2018-01-16 DIAGNOSIS — H52223 Regular astigmatism, bilateral: Secondary | ICD-10-CM | POA: Diagnosis not present

## 2018-01-16 DIAGNOSIS — E119 Type 2 diabetes mellitus without complications: Secondary | ICD-10-CM | POA: Diagnosis not present

## 2018-01-16 DIAGNOSIS — H5203 Hypermetropia, bilateral: Secondary | ICD-10-CM | POA: Diagnosis not present

## 2018-01-16 LAB — HM DIABETES EYE EXAM

## 2018-01-21 ENCOUNTER — Other Ambulatory Visit: Payer: Self-pay | Admitting: Family

## 2018-01-21 DIAGNOSIS — M19042 Primary osteoarthritis, left hand: Secondary | ICD-10-CM

## 2018-02-02 ENCOUNTER — Encounter: Payer: Medicare Other | Admitting: *Deleted

## 2018-02-13 ENCOUNTER — Ambulatory Visit (INDEPENDENT_AMBULATORY_CARE_PROVIDER_SITE_OTHER): Payer: Medicare Other | Admitting: *Deleted

## 2018-02-13 ENCOUNTER — Encounter: Payer: Self-pay | Admitting: *Deleted

## 2018-02-13 VITALS — BP 120/71 | HR 90 | Ht 61.0 in | Wt 118.0 lb

## 2018-02-13 DIAGNOSIS — E1159 Type 2 diabetes mellitus with other circulatory complications: Secondary | ICD-10-CM | POA: Diagnosis not present

## 2018-02-13 DIAGNOSIS — E785 Hyperlipidemia, unspecified: Secondary | ICD-10-CM | POA: Diagnosis not present

## 2018-02-13 DIAGNOSIS — Z0000 Encounter for general adult medical examination without abnormal findings: Secondary | ICD-10-CM

## 2018-02-13 DIAGNOSIS — Z23 Encounter for immunization: Secondary | ICD-10-CM

## 2018-02-13 DIAGNOSIS — M19042 Primary osteoarthritis, left hand: Secondary | ICD-10-CM | POA: Diagnosis not present

## 2018-02-13 DIAGNOSIS — E1169 Type 2 diabetes mellitus with other specified complication: Secondary | ICD-10-CM | POA: Diagnosis not present

## 2018-02-13 DIAGNOSIS — I10 Essential (primary) hypertension: Secondary | ICD-10-CM | POA: Diagnosis not present

## 2018-02-13 LAB — BAYER DCA HB A1C WAIVED: HB A1C: 6.2 % (ref <7.0)

## 2018-02-13 NOTE — Progress Notes (Addendum)
Subjective:   Eric Stuart is a 62 y.o. male who presents for a subsequent Medicare Annual Wellness Visit.  Patient Care Team: [name], FNP as PCP - General (Nurse Practitioner) Melina Schools, OD (Optometry)  Hospitalizations, surgeries, and ER visits in previous 12 months No hospitalizations, ER visits, or surgeries this past year.   Review of Systems    Patient reports that his overall health is unchanged compared to last year.  Cardiac Risk Factors include: advanced age (>35men, >64 women);diabetes mellitus;dyslipidemia;male gender;hypertension   All other systems negative     Current Medications (verified) Outpatient Encounter Medications as of 02/13/2018  Medication Sig  . aspirin 81 MG tablet Take 81 mg by mouth daily.  . meloxicam (MOBIC) 15 MG tablet TAKE 1 TABLET BY MOUTH EVERY DAY  . metFORMIN (GLUCOPHAGE) 1000 MG tablet TAKE 1 TABLET (1,000 MG TOTAL) BY MOUTH 2 (TWO) TIMES DAILY WITH A MEAL.  . Olmesartan-amLODIPine-HCTZ 40-10-25 MG TABS TAKE 1 TABLET BY MOUTH DAILY.  . rosuvastatin (CRESTOR) 10 MG tablet Take 1 tablet (10 mg total) by mouth every evening.   No facility-administered encounter medications on file as of 02/13/2018.     Allergies (verified) Patient has no known allergies.   History: Past Medical History:  Diagnosis Date  . Diabetes mellitus without complication ([place])   . Hyperlipidemia   . Hypertension   . Sarcoidosis 1985   Sister provided    Past Surgical History:  Procedure Laterality Date  . ADENOIDECTOMY  childhod  . TONSILLECTOMY     Family History  Problem Relation Age of Onset  . Cancer Mother        cervical  . Peripheral Artery Disease Father   . Heart failure Father   . Diabetes Sister   . Heart attack Brother   . Heart attack Sister   . Stroke Sister    Social History   Socioeconomic History  . Marital status: Single    Spouse name: Not on file  . Number of children: 0  . Years of education: 41  .  Highest education level: 12th grade  Occupational History  . Occupation: disabled  Social Needs  . Financial resource strain: Not hard at all  . Food insecurity:    Worry: Never true    Inability: Never true  . Transportation needs:    Medical: No    Non-medical: No  Tobacco Use  . Smoking status: Former Smoker    Packs/day: 2.00    Years: 4.00    Pack years: 8.00    Last attempt to quit: 09/29/2013    Years since quitting: 4.3  . Smokeless tobacco: Never Used  Substance and Sexual Activity  . Alcohol use: No    Alcohol/week: 0.0 standard drinks  . Drug use: No  . Sexual activity: Not Currently  Lifestyle  . Physical activity:    Days per week: 7 days    Minutes per session: 150+ min  . Stress: Not at all  Relationships  . Social connections:    Talks on phone: More than three times a week    Gets together: More than three times a week    Attends religious service: More than 4 times per year    Active member of club or organization: No    Attends meetings of clubs or organizations: Never    Relationship status: Separated  Other Topics Concern  . Not on file  Social History Narrative   Patient is disabled and lives  at home with his sister. He is very active and walks or bicycles around town. He does this daily and probably averages about 50 miles a week on his bike. He is not married and does not have any children.      Clinical Intake:     Pain : No/denies pain     Nutritional Status: BMI of 19-24  Normal(2 meals a day. Combination of eating at home and eating out. His sister usually cooks the meals he has at home. Drinks mostly water and some tea. ) Nutritional Risks: None Diabetes: Yes CBG done?: No Did pt. bring in CBG monitor from home?: No  How often do you need to have someone help you when you read instructions, pamphlets, or other written materials from your doctor or pharmacy?: 4 - Often What is the last grade level you completed in school?:  12  Interpreter Needed?: No  Information entered by :: Chong Sicilian, RN   Activities of Daily Living In your present state of health, do you have any difficulty performing the following activities: 02/13/2018  Hearing? N  Vision? N  Comment Eye exam was last week  Difficulty concentrating or making decisions? N  Walking or climbing stairs? N  Dressing or bathing? N  Doing errands, shopping? N  Preparing Food and eating ? N  Using the Toilet? N  In the past six months, have you accidently leaked urine? N  Do you have problems with loss of bowel control? N  Managing your Medications? N  Managing your Finances? Y  Comment help from his sister  Housekeeping or managing your Housekeeping? Y  Some recent data might be hidden     Exercise Current Exercise Habits: Home exercise routine, Type of exercise: walking;Other - see comments(bicycle), Time (Minutes): 60, Frequency (Times/Week): 7, Weekly Exercise (Minutes/Week): 420, Intensity: Moderate, Exercise limited by: None identified   Depression Screen PHQ 2/9 Scores 02/13/2018 12/29/2017 06/27/2017 06/11/2017 01/30/2017 01/16/2017 12/12/2016  PHQ - 2 Score 0 0 0 0 0 0 0     Fall Risk Fall Risk  02/13/2018 12/29/2017 06/27/2017 06/11/2017 01/30/2017  Falls in the past year? 0 No No No No  Number falls in past yr: 0 - - - -  Injury with Fall? 0 - - - -  Follow up Falls prevention discussed - - - -     Objective:    Today's Vitals   02/13/18 1513  BP: 120/71  Pulse: 90  Weight: 118 lb (53.5 kg)  Height: 5\' 1"  (1.549 m)   Body mass index is 22.3 kg/m.  Advanced Directives 02/13/2018  Does Patient Have a Medical Advance Directive? No  Would patient like information on creating a medical advance directive? Yes (MAU/Ambulatory/Procedural Areas - Information given)    Hearing/Vision  No hearing or vision deficits noted during visit.  Cognitive Function: MMSE - Mini Mental State Exam 02/13/2018 01/30/2017  Not completed:  Unable to complete Unable to complete      Immunizations and Health Maintenance Immunization History  Administered Date(s) Administered  . Influenza Inj Mdck Quad Pf 12/15/2017  . Influenza Split 02/26/2016, 01/02/2017  . Influenza,inj,Quad PF,6+ Mos 03/01/2013, 12/29/2013  . Influenza-Unspecified 12/15/2017  . Pneumococcal Conjugate-13 04/26/2014  . Pneumococcal Polysaccharide-23 02/13/2018  . Tdap 03/01/2013  . Zoster 05/04/2012   Health Maintenance Due  Topic Date Due  . FOOT EXAM  12/12/2017  . HEMOGLOBIN A1C  12/12/2017   Health Maintenance  Topic Date Due  . FOOT EXAM  12/12/2017  .  HEMOGLOBIN A1C  12/12/2017  . OPHTHALMOLOGY EXAM  01/17/2019  . COLONOSCOPY  04/04/2020  . TETANUS/TDAP  03/02/2023  . INFLUENZA VACCINE  Completed  . PNEUMOCOCCAL POLYSACCHARIDE VACCINE AGE 60-64 HIGH RISK  Completed  . Hepatitis C Screening  Completed  . HIV Screening  Completed        Assessment:   This is a routine wellness examination for Eric Stuart.    Plan:    Goals    . Exercise 150 minutes per week (moderate activity)    . Have 3 meals a day        Health Maintenance & Additional Screening Recommendations: Pneumococcal vaccine  Diabetic Foot Exam  Hgb A1C  Lung: Low Dose CT Chest recommended if Age 26-80 years, 30 pack-year currently smoking OR have quit w/in 15years. Patient does not qualify. Hepatitis C Screening recommended: no  Today's Orders Orders Placed This Encounter  Procedures  . Pneumococcal polysaccharide vaccine 23-valent greater than or equal to 2yo subcutaneous/IM   Pneumovax administered Labs from September visit with [name], FNP were drawn today Keep f/u with [name], FNP and any other specialty appointments you may have Continue current medications Move carefully to avoid falls. Use assistive devices like a cane or walker if needed. Aim for at least 150 minutes of moderate activity a week. This can be done with chair  exercises if necessary. Stay connected with friends and family  I have personally reviewed and noted the following in the patient's chart:   . Medical and social history . Use of alcohol, tobacco or illicit drugs  . Current medications and supplements . Functional ability and status . Nutritional status . Physical activity . Advanced directives . List of other physicians . Hospitalizations, surgeries, and ER visits in previous 12 months . Vitals . Screenings to include cognitive, depression, and falls . Referrals and appointments  In addition, I have reviewed and discussed with patient certain preventive protocols, quality metrics, and best practice recommendations. A written personalized care plan for preventive services as well as general preventive health recommendations were provided to patient.     Chong Sicilian, RN   02/13/2018    I have reviewed and agree with the above AWV documentation.   [name], FNP

## 2018-02-13 NOTE — Patient Instructions (Addendum)
Eric Stuart , Thank you for taking time to come for your Medicare Wellness Visit. I appreciate your ongoing commitment to your health goals. Please review the following plan we discussed and let me know if I can assist you in the future.   These are the goals we discussed: Goals    . Exercise 150 minutes per week (moderate activity)    . Have 3 meals a day       This is a list of the screening recommended for you and due dates:  Health Maintenance  Topic Date Due  . Pneumococcal vaccine  08/15/1957  . Complete foot exam   12/12/2017  . Hemoglobin A1C  12/12/2017  . Eye exam for diabetics  01/17/2019  . Colon Cancer Screening  04/04/2020  . Tetanus Vaccine  03/02/2023  . Flu Shot  Completed  .  Hepatitis C: One time screening is recommended by Center for Disease Control  (CDC) for  adults born from [birth year] through 1964.   Completed  . HIV Screening  Completed     Pneumococcal Vaccine, Polyvalent solution for injection What is this medicine? PNEUMOCOCCAL VACCINE, POLYVALENT (NEU mo KOK al vak SEEN, pol ee VEY luhnt) is a vaccine to prevent pneumococcus bacteria infection. These bacteria are a major cause of ear infections, Strep throat infections, and serious pneumonia, meningitis, or blood infections worldwide. These vaccines help the body to produce antibodies (protective substances) that help your body defend against these bacteria. This vaccine is recommended for people 89 years of age and older with health problems. It is also recommended for all adults over 6 years old. This vaccine will not treat an infection. This medicine may be used for other purposes; ask your health care provider or pharmacist if you have questions. COMMON BRAND NAME(S): Pneumovax 23 What should I tell my health care provider before I take this medicine? They need to know if you have any of these conditions: -bleeding problems -bone marrow or organ transplant -cancer, Hodgkin's  disease -fever -infection -immune system problems -low platelet count in the blood -seizures -an unusual or allergic reaction to pneumococcal vaccine, diphtheria toxoid, other vaccines, latex, other medicines, foods, dyes, or preservatives -pregnant or trying to get pregnant -breast-feeding How should I use this medicine? This vaccine is for injection into a muscle or under the skin. It is given by a health care professional. A copy of Vaccine Information Statements will be given before each vaccination. Read this sheet carefully each time. The sheet may change frequently. Talk to your pediatrician regarding the use of this medicine in children. While this drug may be prescribed for children as young as 38 years of age for selected conditions, precautions do apply. Overdosage: If you think you have taken too much of this medicine contact a poison control center or emergency room at once. NOTE: This medicine is only for you. Do not share this medicine with others. What if I miss a dose? It is important not to miss your dose. Call your doctor or health care professional if you are unable to keep an appointment. What may interact with this medicine? -medicines for cancer chemotherapy -medicines that suppress your immune function -medicines that treat or prevent blood clots like warfarin, enoxaparin, and dalteparin -steroid medicines like prednisone or cortisone This list may not describe all possible interactions. Give your health care provider a list of all the medicines, herbs, non-prescription drugs, or dietary supplements you use. Also tell them if you smoke, drink alcohol, or  use illegal drugs. Some items may interact with your medicine. What should I watch for while using this medicine? Mild fever and pain should go away in 3 days or less. Report any unusual symptoms to your doctor or health care professional. What side effects may I notice from receiving this medicine? Side effects that  you should report to your doctor or health care professional as soon as possible: -allergic reactions like skin rash, itching or hives, swelling of the face, lips, or tongue -breathing problems -confused -fever over 102 degrees F -pain, tingling, numbness in the hands or feet -seizures -unusual bleeding or bruising -unusual muscle weakness Side effects that usually do not require medical attention (report to your doctor or health care professional if they continue or are bothersome): -aches and pains -diarrhea -fever of 102 degrees F or less -headache -irritable -loss of appetite -pain, tender at site where injected -trouble sleeping This list may not describe all possible side effects. Call your doctor for medical advice about side effects. You may report side effects to FDA at 1-800-FDA-1088. Where should I keep my medicine? This does not apply. This vaccine is given in a clinic, pharmacy, doctor's office, or other health care setting and will not be stored at home. NOTE: This sheet is a summary. It may not cover all possible information. If you have questions about this medicine, talk to your doctor, pharmacist, or health care provider.  2018 Elsevier/Gold Standard (2007-10-23 14:32:37)

## 2018-02-14 LAB — CBC WITH DIFFERENTIAL/PLATELET
BASOS ABS: 0 10*3/uL (ref 0.0–0.2)
Basos: 0 %
EOS (ABSOLUTE): 0.3 10*3/uL (ref 0.0–0.4)
Eos: 6 %
Hematocrit: 29.2 % — ABNORMAL LOW (ref 37.5–51.0)
Hemoglobin: 9.2 g/dL — ABNORMAL LOW (ref 13.0–17.7)
IMMATURE GRANS (ABS): 0 10*3/uL (ref 0.0–0.1)
Immature Granulocytes: 0 %
LYMPHS: 20 %
Lymphocytes Absolute: 1.1 10*3/uL (ref 0.7–3.1)
MCH: 26.6 pg (ref 26.6–33.0)
MCHC: 31.5 g/dL (ref 31.5–35.7)
MCV: 84 fL (ref 79–97)
MONOS ABS: 0.9 10*3/uL (ref 0.1–0.9)
Monocytes: 17 %
NEUTROS ABS: 3.1 10*3/uL (ref 1.4–7.0)
Neutrophils: 57 %
Platelets: 251 10*3/uL (ref 150–450)
RBC: 3.46 x10E6/uL — ABNORMAL LOW (ref 4.14–5.80)
RDW: 14.4 % (ref 12.3–15.4)
WBC: 5.4 10*3/uL (ref 3.4–10.8)

## 2018-02-14 LAB — LIPID PANEL
CHOLESTEROL TOTAL: 140 mg/dL (ref 100–199)
Chol/HDL Ratio: 1.8 ratio (ref 0.0–5.0)
HDL: 78 mg/dL (ref >39)
LDL Calculated: 53 mg/dL (ref 0–99)
Triglycerides: 44 mg/dL (ref 0–149)
VLDL Cholesterol Cal: 9 mg/dL (ref 5–40)

## 2018-02-14 LAB — CMP14+EGFR
A/G RATIO: 1.7 (ref 1.2–2.2)
ALT: 14 IU/L (ref 0–44)
AST: 19 IU/L (ref 0–40)
Albumin: 4.6 g/dL (ref 3.6–4.8)
Alkaline Phosphatase: 45 IU/L (ref 39–117)
BILIRUBIN TOTAL: 0.3 mg/dL (ref 0.0–1.2)
BUN/Creatinine Ratio: 13 (ref 10–24)
BUN: 15 mg/dL (ref 8–27)
CHLORIDE: 109 mmol/L — AB (ref 96–106)
CO2: 22 mmol/L (ref 20–29)
Calcium: 9.5 mg/dL (ref 8.6–10.2)
Creatinine, Ser: 1.17 mg/dL (ref 0.76–1.27)
GFR calc non Af Amer: 67 mL/min/{1.73_m2} (ref >59)
GFR, EST AFRICAN AMERICAN: 77 mL/min/{1.73_m2} (ref >59)
Globulin, Total: 2.7 g/dL (ref 1.5–4.5)
Glucose: 142 mg/dL — ABNORMAL HIGH (ref 65–99)
POTASSIUM: 4.2 mmol/L (ref 3.5–5.2)
SODIUM: 146 mmol/L — AB (ref 134–144)
TOTAL PROTEIN: 7.3 g/dL (ref 6.0–8.5)

## 2018-02-20 ENCOUNTER — Encounter: Payer: Self-pay | Admitting: *Deleted

## 2018-03-07 ENCOUNTER — Other Ambulatory Visit: Payer: Self-pay | Admitting: Family

## 2018-03-11 ENCOUNTER — Other Ambulatory Visit: Payer: Self-pay | Admitting: Family

## 2018-06-08 ENCOUNTER — Other Ambulatory Visit: Payer: Self-pay | Admitting: Family

## 2018-06-12 ENCOUNTER — Other Ambulatory Visit: Payer: Self-pay | Admitting: Family

## 2018-06-12 NOTE — Telephone Encounter (Signed)
Last seen 12/29/17  [facility]

## 2018-06-16 ENCOUNTER — Other Ambulatory Visit: Payer: Self-pay | Admitting: Family

## 2018-06-29 ENCOUNTER — Ambulatory Visit: Payer: Medicare Other | Admitting: Family

## 2018-07-13 ENCOUNTER — Telehealth: Payer: Self-pay

## 2018-07-13 NOTE — Telephone Encounter (Signed)
Olmsrtn-amldpn-HCTZ no longer covered by insurance   Please prescribe alternative

## 2018-07-14 MED ORDER — OLMESARTAN MEDOXOMIL 40 MG PO TABS: 40.0000 mg | ORAL_TABLET | Freq: Every day | ORAL | 1 refills | Status: DC

## 2018-07-14 MED ORDER — AMLODIPINE BESYLATE 10 MG PO TABS: 10.0000 mg | ORAL_TABLET | Freq: Every day | ORAL | 3 refills | Status: DC

## 2018-07-14 MED ORDER — HYDROCHLOROTHIAZIDE 25 MG PO TABS: 25.0000 mg | ORAL_TABLET | Freq: Every day | ORAL | 3 refills | Status: DC

## 2018-07-14 NOTE — Telephone Encounter (Signed)
Pt aware.

## 2018-07-14 NOTE — Addendum Note (Signed)
Addended by: [name] A on: 07/14/2018 08:27 AM   Modules accepted: Orders

## 2018-07-14 NOTE — Telephone Encounter (Signed)
Per insurance, combo blood pressure pill was broke up and sent in as separate rx. Olmesartan 40 mg, Norvasc 10 mg, and HCTZ 25 mg prescriptions sent to pharmacy .

## 2018-08-11 ENCOUNTER — Other Ambulatory Visit: Payer: Self-pay | Admitting: Family

## 2018-08-28 ENCOUNTER — Telehealth: Payer: Self-pay | Admitting: Family

## 2018-08-31 ENCOUNTER — Ambulatory Visit (INDEPENDENT_AMBULATORY_CARE_PROVIDER_SITE_OTHER): Payer: Medicare Other | Admitting: Family

## 2018-08-31 ENCOUNTER — Encounter: Payer: Self-pay | Admitting: Family

## 2018-08-31 ENCOUNTER — Other Ambulatory Visit: Payer: Self-pay

## 2018-08-31 VITALS — BP 136/89 | HR 64 | Temp 97.7°F | Ht 61.0 in | Wt 122.4 lb

## 2018-08-31 DIAGNOSIS — E1169 Type 2 diabetes mellitus with other specified complication: Secondary | ICD-10-CM

## 2018-08-31 DIAGNOSIS — E1159 Type 2 diabetes mellitus with other circulatory complications: Secondary | ICD-10-CM

## 2018-08-31 DIAGNOSIS — I10 Essential (primary) hypertension: Secondary | ICD-10-CM | POA: Diagnosis not present

## 2018-08-31 DIAGNOSIS — D869 Sarcoidosis, unspecified: Secondary | ICD-10-CM | POA: Diagnosis not present

## 2018-08-31 DIAGNOSIS — E785 Hyperlipidemia, unspecified: Secondary | ICD-10-CM | POA: Diagnosis not present

## 2018-08-31 LAB — BAYER DCA HB A1C WAIVED: HB A1C (BAYER DCA - WAIVED): 6.4 % (ref <7.0)

## 2018-08-31 MED ORDER — METFORMIN HCL 1000 MG PO TABS: 1000.0000 mg | ORAL_TABLET | Freq: Two times a day (BID) | ORAL | 1 refills | Status: DC

## 2018-08-31 NOTE — Progress Notes (Signed)
 Subjective:    Patient ID: Eric Stuart, male    DOB: 06/22/1955, 63 y.o.   MRN: [id number]  Chief Complaint  Patient presents with  . Medical Management of Chronic Issues  . Diabetes    Pt presents to the office today for chronic follow up. Pt has hx sarcoidosis. Diabetes  He presents for his follow-up diabetic visit. He has type 2 diabetes mellitus. There are no hypoglycemic associated symptoms. Pertinent negatives for diabetes include no blurred vision and no foot paresthesias. Symptoms are stable. Pertinent negatives for diabetic complications include no CVA, heart disease, nephropathy or peripheral neuropathy. Risk factors for coronary artery disease include dyslipidemia, male sex and hypertension. He is following a generally healthy diet. (Does not check BS at home ) Eye exam is current.  Hypertension  This is a chronic problem. The current episode started more than 1 year ago. The problem has been waxing and waning since onset. The problem is uncontrolled. Pertinent negatives include no blurred vision, peripheral edema or shortness of breath. Risk factors for coronary artery disease include diabetes mellitus, dyslipidemia, male gender and sedentary lifestyle. The current treatment provides moderate improvement. There is no history of CAD/MI or CVA.  Hyperlipidemia  This is a chronic problem. The current episode started more than 1 year ago. The problem is controlled. Recent lipid tests were reviewed and are normal. Pertinent negatives include no shortness of breath. Current antihyperlipidemic treatment includes statins. The current treatment provides moderate improvement of lipids. Risk factors for coronary artery disease include dyslipidemia, diabetes mellitus, male sex, hypertension and a sedentary lifestyle.      Review of Systems  Eyes: Negative for blurred vision.  Respiratory: Negative for shortness of breath.   All other systems reviewed and are negative.       Objective:   Physical Exam Vitals signs reviewed.  Constitutional:      General: He is not in acute distress.    Appearance: He is well-developed.  HENT:     Head: Normocephalic.     Right Ear: Tympanic membrane normal.     Left Ear: Tympanic membrane normal.  Eyes:     General:        Right eye: No discharge.        Left eye: No discharge.     Pupils: Pupils are equal, round, and reactive to light.  Neck:     Musculoskeletal: Normal range of motion and neck supple.     Thyroid: No thyromegaly.  Cardiovascular:     Rate and Rhythm: Normal rate and regular rhythm.     Heart sounds: Normal heart sounds. No murmur.  Pulmonary:     Effort: Pulmonary effort is normal. No respiratory distress.     Breath sounds: Normal breath sounds. No wheezing.  Abdominal:     General: Bowel sounds are normal. There is no distension.     Palpations: Abdomen is soft.     Tenderness: There is no abdominal tenderness.  Musculoskeletal: Normal range of motion.        General: No tenderness.  Skin:    General: Skin is warm and dry.     Findings: No erythema or rash.  Neurological:     Mental Status: He is alert and oriented to person, place, and time.     Cranial Nerves: No cranial nerve deficit.     Deep Tendon Reflexes: Reflexes are normal and symmetric.  Psychiatric:        Behavior: Behavior normal.          Thought Content: Thought content normal.        Judgment: Judgment normal.       BP 136/89   Pulse 64   Temp 97.7 F (36.5 C) (Oral)   Ht 5' 1" (1.549 m)   Wt 122 lb 6.4 oz (55.5 kg)   BMI 23.13 kg/m   Diabetic Foot Exam - Simple   Simple Foot Form Diabetic Foot exam was performed with the following findings:  Yes 08/31/2018  2:30 PM  Visual Inspection No deformities, no ulcerations, no other skin breakdown bilaterally:  Yes Sensation Testing Intact to touch and monofilament testing bilaterally:  Yes Pulse Check Posterior Tibialis and Dorsalis pulse intact bilaterally:  Yes  Comments        Assessment & Plan:  Ariana Cavenaugh comes in today with chief complaint of Medical Management of Chronic Issues and Diabetes   Diagnosis and orders addressed:  1. Type 2 diabetes mellitus with other specified complication, without long-term current use of insulin (HCC) - metFORMIN (GLUCOPHAGE) 1000 MG tablet; Take 1 tablet (1,000 mg total) by mouth 2 (two) times daily with a meal.  Dispense: 180 tablet; Refill: 1 - Bayer DCA Hb A1c Waived - CMP14+EGFR - CBC with Differential/Platelet - Microalbumin / creatinine urine ratio  2. Hyperlipidemia due to type 2 diabetes mellitus ([place]) - CMP14+EGFR - CBC with Differential/Platelet  3. Hypertension associated with diabetes ([place]) - CMP14+EGFR - CBC with Differential/Platelet  4. Sarcoidosis - CMP14+EGFR - CBC with Differential/Platelet   Labs pending Health Maintenance reviewed Diet and exercise encouraged  Follow up plan: 6 months    [name], FNP

## 2018-08-31 NOTE — Patient Instructions (Signed)

## 2018-09-01 LAB — CMP14+EGFR
ALT: 22 IU/L (ref 0–44)
AST: 24 IU/L (ref 0–40)
Albumin/Globulin Ratio: 1.5 (ref 1.2–2.2)
Albumin: 4.5 g/dL (ref 3.8–4.8)
Alkaline Phosphatase: 52 IU/L (ref 39–117)
BUN/Creatinine Ratio: 17 (ref 10–24)
BUN: 23 mg/dL (ref 8–27)
Bilirubin Total: 0.4 mg/dL (ref 0.0–1.2)
CO2: 24 mmol/L (ref 20–29)
Calcium: 9.7 mg/dL (ref 8.6–10.2)
Chloride: 103 mmol/L (ref 96–106)
Creatinine, Ser: 1.36 mg/dL — ABNORMAL HIGH (ref 0.76–1.27)
GFR calc Af Amer: 64 mL/min/{1.73_m2} (ref >59)
GFR calc non Af Amer: 55 mL/min/{1.73_m2} — ABNORMAL LOW (ref >59)
Globulin, Total: 3 g/dL (ref 1.5–4.5)
Glucose: 97 mg/dL (ref 65–99)
Potassium: 4.1 mmol/L (ref 3.5–5.2)
Sodium: 140 mmol/L (ref 134–144)
Total Protein: 7.5 g/dL (ref 6.0–8.5)

## 2018-09-01 LAB — MICROALBUMIN / CREATININE URINE RATIO
Creatinine, Urine: 114.6 mg/dL
Microalb/Creat Ratio: 17 mg/g creat (ref 0–29)
Microalbumin, Urine: 19 ug/mL

## 2018-09-01 LAB — CBC WITH DIFFERENTIAL/PLATELET
Basophils Absolute: 0 10*3/uL (ref 0.0–0.2)
Basos: 0 %
EOS (ABSOLUTE): 0.2 10*3/uL (ref 0.0–0.4)
Eos: 3 %
Hematocrit: 33 % — ABNORMAL LOW (ref 37.5–51.0)
Hemoglobin: 10.5 g/dL — ABNORMAL LOW (ref 13.0–17.7)
Immature Grans (Abs): 0 10*3/uL (ref 0.0–0.1)
Immature Granulocytes: 0 %
Lymphocytes Absolute: 1 10*3/uL (ref 0.7–3.1)
Lymphs: 19 %
MCH: 24.5 pg — ABNORMAL LOW (ref 26.6–33.0)
MCHC: 31.8 g/dL (ref 31.5–35.7)
MCV: 77 fL — ABNORMAL LOW (ref 79–97)
Monocytes Absolute: 1 10*3/uL — ABNORMAL HIGH (ref 0.1–0.9)
Monocytes: 19 %
Neutrophils Absolute: 3 10*3/uL (ref 1.4–7.0)
Neutrophils: 59 %
Platelets: 222 10*3/uL (ref 150–450)
RBC: 4.28 x10E6/uL (ref 4.14–5.80)
RDW: 18.1 % — ABNORMAL HIGH (ref 11.6–15.4)
WBC: 5.2 10*3/uL (ref 3.4–10.8)

## 2018-09-04 ENCOUNTER — Other Ambulatory Visit: Payer: Self-pay | Admitting: Family

## 2018-09-17 ENCOUNTER — Encounter: Payer: Self-pay | Admitting: *Deleted

## 2018-09-19 ENCOUNTER — Other Ambulatory Visit: Payer: Self-pay | Admitting: Family

## 2018-11-29 ENCOUNTER — Other Ambulatory Visit: Payer: Self-pay | Admitting: Family

## 2018-11-30 ENCOUNTER — Other Ambulatory Visit: Payer: Self-pay

## 2018-12-01 ENCOUNTER — Ambulatory Visit (INDEPENDENT_AMBULATORY_CARE_PROVIDER_SITE_OTHER): Payer: Medicare Other | Admitting: Family

## 2018-12-01 ENCOUNTER — Encounter: Payer: Self-pay | Admitting: Family

## 2018-12-01 VITALS — BP 133/83 | HR 69 | Temp 97.3°F | Ht 61.0 in | Wt 123.6 lb

## 2018-12-01 DIAGNOSIS — R351 Nocturia: Secondary | ICD-10-CM

## 2018-12-01 DIAGNOSIS — I10 Essential (primary) hypertension: Secondary | ICD-10-CM

## 2018-12-01 DIAGNOSIS — E1169 Type 2 diabetes mellitus with other specified complication: Secondary | ICD-10-CM

## 2018-12-01 DIAGNOSIS — H6121 Impacted cerumen, right ear: Secondary | ICD-10-CM

## 2018-12-01 DIAGNOSIS — E785 Hyperlipidemia, unspecified: Secondary | ICD-10-CM | POA: Diagnosis not present

## 2018-12-01 DIAGNOSIS — E1159 Type 2 diabetes mellitus with other circulatory complications: Secondary | ICD-10-CM | POA: Diagnosis not present

## 2018-12-01 DIAGNOSIS — I152 Hypertension secondary to endocrine disorders: Secondary | ICD-10-CM

## 2018-12-01 LAB — BAYER DCA HB A1C WAIVED: HB A1C (BAYER DCA - WAIVED): 6.9 % (ref <7.0)

## 2018-12-01 NOTE — Patient Instructions (Signed)
Earwax Buildup, Adult The ears produce a substance called earwax that helps keep bacteria out of the ear and protects the skin in the ear canal. Occasionally, earwax can build up in the ear and cause discomfort or hearing loss. What increases the risk? This condition is more likely to develop in people who:  Are male.  Are elderly.  Naturally produce more earwax.  Clean their ears often with cotton swabs.  Use earplugs often.  Use in-ear headphones often.  Wear hearing aids.  Have narrow ear canals.  Have earwax that is overly thick or sticky.  Have eczema.  Are dehydrated.  Have excess hair in the ear canal. What are the signs or symptoms? Symptoms of this condition include:  Reduced or muffled hearing.  A feeling of fullness in the ear or feeling that the ear is plugged.  Fluid coming from the ear.  Ear pain.  Ear itch.  Ringing in the ear.  Coughing.  An obvious piece of earwax that can be seen inside the ear canal. How is this diagnosed? This condition may be diagnosed based on:  Your symptoms.  Your medical history.  An ear exam. During the exam, your health care provider will look into your ear with an instrument called an otoscope. You may have tests, including a hearing test. How is this treated? This condition may be treated by:  Using ear drops to soften the earwax.  Having the earwax removed by a health care provider. The health care provider may: ? Flush the ear with water. ? Use an instrument that has a loop on the end (curette). ? Use a suction device.  Surgery to remove the wax buildup. This may be done in severe cases. Follow these instructions at home:   Take over-the-counter and prescription medicines only as told by your health care provider.  Do not put any objects, including cotton swabs, into your ear. You can clean the opening of your ear canal with a washcloth or facial tissue.  Follow instructions from your health care  provider about cleaning your ears. Do not over-clean your ears.  Drink enough fluid to keep your urine clear or pale yellow. This will help to thin the earwax.  Keep all follow-up visits as told by your health care provider. If earwax builds up in your ears often or if you use hearing aids, consider seeing your health care provider for routine, preventive ear cleanings. Ask your health care provider how often you should schedule your cleanings.  If you have hearing aids, clean them according to instructions from the manufacturer and your health care provider. Contact a health care provider if:  You have ear pain.  You develop a fever.  You have blood, pus, or other fluid coming from your ear.  You have hearing loss.  You have ringing in your ears that does not go away.  Your symptoms do not improve with treatment.  You feel like the room is spinning (vertigo). Summary  Earwax can build up in the ear and cause discomfort or hearing loss.  The most common symptoms of this condition include reduced or muffled hearing and a feeling of fullness in the ear or feeling that the ear is plugged.  This condition may be diagnosed based on your symptoms, your medical history, and an ear exam.  This condition may be treated by using ear drops to soften the earwax or by having the earwax removed by a health care provider.  Do not put any   objects, including cotton swabs, into your ear. You can clean the opening of your ear canal with a washcloth or facial tissue. This information is not intended to replace advice given to you by your health care provider. Make sure you discuss any questions you have with your health care provider. Document Released: 04/25/2004 Document Revised: 02/28/2017 Document Reviewed: 05/29/2016 Elsevier Patient Education  2020 [employer]  

## 2018-12-01 NOTE — Progress Notes (Signed)
Subjective:    Patient ID: Eric Stuart, male    DOB: 1955-09-15, 63 y.o.   MRN: [id number]  Chief Complaint  Patient presents with  . Medical Management of Chronic Issues   Pt presents to the office today for chronic follow up. Pt has hx sarcoidosis Diabetes He presents for his follow-up diabetic visit. He has type 2 diabetes mellitus. His disease course has been stable. There are no hypoglycemic associated symptoms. Pertinent negatives for hypoglycemia include no headaches. Pertinent negatives for diabetes include no blurred vision, no foot paresthesias and no visual change. There are no hypoglycemic complications. Symptoms are stable. Pertinent negatives for diabetic complications include no CVA, heart disease, nephropathy or peripheral neuropathy. Risk factors for coronary artery disease include dyslipidemia, diabetes mellitus and male sex. He is following a generally healthy diet. (Does not check BS at home ) Eye exam is current.  Hypertension This is a chronic problem. The current episode started more than 1 year ago. The problem has been resolved since onset. The problem is controlled. Pertinent negatives include no blurred vision, headaches, malaise/fatigue, peripheral edema or shortness of breath. Risk factors for coronary artery disease include dyslipidemia, diabetes mellitus, male gender and sedentary lifestyle. The current treatment provides moderate improvement. There is no history of CVA.  Hyperlipidemia This is a chronic problem. The current episode started more than 1 year ago. The problem is controlled. Recent lipid tests were reviewed and are normal. Pertinent negatives include no shortness of breath. Current antihyperlipidemic treatment includes statins. The current treatment provides moderate improvement of lipids. Risk factors for coronary artery disease include dyslipidemia, diabetes mellitus, male sex, hypertension and a sedentary lifestyle.  Nocturia PT states he wakes up  3-4 times a night to urinate.    Review of Systems  Constitutional: Negative for malaise/fatigue.  Eyes: Negative for blurred vision.  Respiratory: Negative for shortness of breath.   Neurological: Negative for headaches.  All other systems reviewed and are negative.      Objective:   Physical Exam Vitals signs reviewed.  Constitutional:      General: He is not in acute distress.    Appearance: He is well-developed.  HENT:     Head: Normocephalic.     Right Ear: Tympanic membrane normal. There is impacted cerumen.     Left Ear: Tympanic membrane normal.  Eyes:     General:        Right eye: No discharge.        Left eye: No discharge.     Pupils: Pupils are equal, round, and reactive to light.  Neck:     Musculoskeletal: Normal range of motion and neck supple.     Thyroid: No thyromegaly.  Cardiovascular:     Rate and Rhythm: Normal rate and regular rhythm.     Heart sounds: Normal heart sounds. No murmur.  Pulmonary:     Effort: Pulmonary effort is normal. No respiratory distress.     Breath sounds: Normal breath sounds. No wheezing.  Abdominal:     General: Bowel sounds are normal. There is no distension.     Palpations: Abdomen is soft.     Tenderness: There is no abdominal tenderness.  Musculoskeletal: Normal range of motion.        General: No tenderness.  Skin:    General: Skin is warm and dry.     Findings: No erythema or rash.  Neurological:     Mental Status: He is alert and oriented to person, place, and  time.     Cranial Nerves: No cranial nerve deficit.     Deep Tendon Reflexes: Reflexes are normal and symmetric.  Psychiatric:        Behavior: Behavior normal.        Thought Content: Thought content normal.        Judgment: Judgment normal.     Nurse uses warm water and peroxide and cleans right ear. TM normal  BP 133/83   Pulse 69   Temp (!) 97.3 F (36.3 C) (Oral)   Ht '5\' 1"'  (1.549 m)   Wt 123 lb 9.6 oz (56.1 kg)   BMI 23.35 kg/m       Assessment & Plan:  Eric Stuart comes in today with chief complaint of Medical Management of Chronic Issues   Diagnosis and orders addressed:  1. Hypertension associated with diabetes ([place]) - CMP14+EGFR - CBC with Differential/Platelet  2. Type 2 diabetes mellitus with other specified complication, without long-term current use of insulin (HCC) - Bayer DCA Hb A1c Waived - CMP14+EGFR - CBC with Differential/Platelet  3. Hyperlipidemia due to type 2 diabetes mellitus ([place]) - CMP14+EGFR - CBC with Differential/Platelet - Lipid panel  4. Nocturia - CMP14+EGFR - CBC with Differential/Platelet - PSA, total and free   5. Impacted cerumen of right ear Ears cleaned   Labs pendingcercer Health Maintenance reviewed Diet and exercise encouraged  Follow up plan: 4 months   [name], FNP

## 2018-12-02 LAB — CBC WITH DIFFERENTIAL/PLATELET
Basophils Absolute: 0 x10E3/uL (ref 0.0–0.2)
Basos: 1 %
EOS (ABSOLUTE): 0.3 x10E3/uL (ref 0.0–0.4)
Eos: 5 %
Hematocrit: 34.2 % — ABNORMAL LOW (ref 37.5–51.0)
Hemoglobin: 10.9 g/dL — ABNORMAL LOW (ref 13.0–17.7)
Immature Grans (Abs): 0 x10E3/uL (ref 0.0–0.1)
Immature Granulocytes: 0 %
Lymphocytes Absolute: 1.4 x10E3/uL (ref 0.7–3.1)
Lymphs: 25 %
MCH: 24.9 pg — ABNORMAL LOW (ref 26.6–33.0)
MCHC: 31.9 g/dL (ref 31.5–35.7)
MCV: 78 fL — ABNORMAL LOW (ref 79–97)
Monocytes Absolute: 0.8 x10E3/uL (ref 0.1–0.9)
Monocytes: 14 %
Neutrophils Absolute: 3.3 x10E3/uL (ref 1.4–7.0)
Neutrophils: 55 %
Platelets: 250 x10E3/uL (ref 150–450)
RBC: 4.37 x10E6/uL (ref 4.14–5.80)
RDW: 18.3 % — ABNORMAL HIGH (ref 11.6–15.4)
WBC: 5.8 x10E3/uL (ref 3.4–10.8)

## 2018-12-02 LAB — CMP14+EGFR
ALT: 11 IU/L (ref 0–44)
AST: 17 IU/L (ref 0–40)
Albumin/Globulin Ratio: 1.6 (ref 1.2–2.2)
Albumin: 4.5 g/dL (ref 3.8–4.8)
Alkaline Phosphatase: 50 IU/L (ref 39–117)
BUN/Creatinine Ratio: 22 (ref 10–24)
BUN: 25 mg/dL (ref 8–27)
Bilirubin Total: 0.4 mg/dL (ref 0.0–1.2)
CO2: 24 mmol/L (ref 20–29)
Calcium: 9.5 mg/dL (ref 8.6–10.2)
Chloride: 105 mmol/L (ref 96–106)
Creatinine, Ser: 1.14 mg/dL (ref 0.76–1.27)
GFR calc Af Amer: 79 mL/min/{1.73_m2} (ref >59)
GFR calc non Af Amer: 69 mL/min/{1.73_m2} (ref >59)
Globulin, Total: 2.9 g/dL (ref 1.5–4.5)
Glucose: 106 mg/dL — ABNORMAL HIGH (ref 65–99)
Potassium: 4.2 mmol/L (ref 3.5–5.2)
Sodium: 142 mmol/L (ref 134–144)
Total Protein: 7.4 g/dL (ref 6.0–8.5)

## 2018-12-02 LAB — LIPID PANEL
Chol/HDL Ratio: 2.5 ratio (ref 0.0–5.0)
Cholesterol, Total: 149 mg/dL (ref 100–199)
HDL: 59 mg/dL
LDL Chol Calc (NIH): 78 mg/dL (ref 0–99)
Triglycerides: 56 mg/dL (ref 0–149)
VLDL Cholesterol Cal: 12 mg/dL (ref 5–40)

## 2018-12-02 LAB — PSA, TOTAL AND FREE
PSA, Free Pct: 40 %
PSA, Free: 0.32 ng/mL
Prostate Specific Ag, Serum: 0.8 ng/mL (ref 0.0–4.0)

## 2018-12-05 LAB — IRON: Iron: 40 ug/dL (ref 38–169)

## 2018-12-05 LAB — SPECIMEN STATUS REPORT

## 2018-12-16 ENCOUNTER — Other Ambulatory Visit: Payer: Self-pay | Admitting: Family

## 2019-02-20 ENCOUNTER — Other Ambulatory Visit: Payer: Self-pay | Admitting: Family

## 2019-03-15 ENCOUNTER — Other Ambulatory Visit: Payer: Self-pay | Admitting: Family

## 2019-03-15 DIAGNOSIS — E1169 Type 2 diabetes mellitus with other specified complication: Secondary | ICD-10-CM

## 2019-04-05 ENCOUNTER — Other Ambulatory Visit: Payer: Self-pay

## 2019-04-05 ENCOUNTER — Ambulatory Visit (INDEPENDENT_AMBULATORY_CARE_PROVIDER_SITE_OTHER): Payer: Medicare Other | Admitting: Family

## 2019-04-05 ENCOUNTER — Encounter: Payer: Self-pay | Admitting: Family

## 2019-04-05 VITALS — BP 120/78 | HR 67 | Temp 96.0°F | Ht 61.0 in | Wt 131.0 lb

## 2019-04-05 DIAGNOSIS — Z23 Encounter for immunization: Secondary | ICD-10-CM | POA: Diagnosis not present

## 2019-04-05 DIAGNOSIS — I152 Hypertension secondary to endocrine disorders: Secondary | ICD-10-CM

## 2019-04-05 DIAGNOSIS — Z1212 Encounter for screening for malignant neoplasm of rectum: Secondary | ICD-10-CM

## 2019-04-05 DIAGNOSIS — I10 Essential (primary) hypertension: Secondary | ICD-10-CM | POA: Diagnosis not present

## 2019-04-05 DIAGNOSIS — E1169 Type 2 diabetes mellitus with other specified complication: Secondary | ICD-10-CM

## 2019-04-05 DIAGNOSIS — Z1211 Encounter for screening for malignant neoplasm of colon: Secondary | ICD-10-CM

## 2019-04-05 DIAGNOSIS — D869 Sarcoidosis, unspecified: Secondary | ICD-10-CM

## 2019-04-05 DIAGNOSIS — E049 Nontoxic goiter, unspecified: Secondary | ICD-10-CM | POA: Diagnosis not present

## 2019-04-05 DIAGNOSIS — E1159 Type 2 diabetes mellitus with other circulatory complications: Secondary | ICD-10-CM | POA: Diagnosis not present

## 2019-04-05 DIAGNOSIS — E785 Hyperlipidemia, unspecified: Secondary | ICD-10-CM

## 2019-04-05 LAB — BAYER DCA HB A1C WAIVED: HB A1C (BAYER DCA - WAIVED): 7.1 % — ABNORMAL HIGH (ref <7.0)

## 2019-04-05 NOTE — Progress Notes (Signed)
Subjective:    Patient ID: Eric Stuart, male    DOB: [birth year], 64 y.o.   MRN: [id number]  Chief Complaint  Patient presents with  . Medical Management of Chronic Issues    four month recheck   Pt presents to the office today for chronic follow up. Pt has hx sarcoidosis. Diabetes He presents for his follow-up diabetic visit. He has type 2 diabetes mellitus. His disease course has been stable. There are no hypoglycemic associated symptoms. Pertinent negatives for diabetes include no blurred vision, no foot paresthesias and no visual change. There are no hypoglycemic complications. Symptoms are stable. Pertinent negatives for diabetic complications include no CVA, heart disease or peripheral neuropathy. Risk factors for coronary artery disease include dyslipidemia, diabetes mellitus, hypertension and male sex. He is following a generally healthy diet. (Does not check BS at home ) Eye exam is current.  Hypertension This is a chronic problem. The current episode started more than 1 year ago. The problem has been resolved since onset. The problem is controlled. Pertinent negatives include no blurred vision, malaise/fatigue, peripheral edema or shortness of breath. Risk factors for coronary artery disease include dyslipidemia, male gender and sedentary lifestyle. The current treatment provides moderate improvement. There is no history of kidney disease, CAD/MI, CVA or heart failure.  Hyperlipidemia This is a chronic problem. The current episode started more than 1 year ago. The problem is controlled. Recent lipid tests were reviewed and are normal. Pertinent negatives include no shortness of breath. Current antihyperlipidemic treatment includes statins. The current treatment provides moderate improvement of lipids. Risk factors for coronary artery disease include dyslipidemia, male sex and hypertension.      Review of Systems  Constitutional: Negative for malaise/fatigue.  Eyes: Negative for  blurred vision.  Respiratory: Negative for shortness of breath.   All other systems reviewed and are negative.      Objective:   Physical Exam Vitals reviewed.  Constitutional:      General: He is not in acute distress.    Appearance: He is well-developed.  HENT:     Head: Normocephalic.     Right Ear: Tympanic membrane normal.     Left Ear: Tympanic membrane normal.     Nose: Nose normal.  Eyes:     General:        Right eye: No discharge.        Left eye: No discharge.     Pupils: Pupils are equal, round, and reactive to light.  Neck:     Thyroid: No thyromegaly.     Comments: Goiter present Cardiovascular:     Rate and Rhythm: Normal rate and regular rhythm.     Heart sounds: Normal heart sounds. No murmur.  Pulmonary:     Effort: Pulmonary effort is normal. No respiratory distress.     Breath sounds: Normal breath sounds. No wheezing.  Abdominal:     General: Bowel sounds are normal. There is no distension.     Palpations: Abdomen is soft.     Tenderness: There is no abdominal tenderness.  Musculoskeletal:        General: No tenderness. Normal range of motion.     Cervical back: Normal range of motion and neck supple.  Skin:    General: Skin is warm and dry.     Findings: No erythema or rash.  Neurological:     Mental Status: He is alert and oriented to person, place, and time.     Cranial Nerves: No cranial nerve  deficit.     Deep Tendon Reflexes: Reflexes are normal and symmetric.  Psychiatric:        Behavior: Behavior normal.        Thought Content: Thought content normal.        Judgment: Judgment normal.       BP 120/78   Pulse 67   Temp (!) 96 F (35.6 C) (Temporal)   Ht _0  (1.549 m)   Wt 131 lb (59.4 kg)   SpO2 100%   BMI 24.75 kg/m      Assessment & Plan:  Myking Sar comes in today with chief complaint of Medical Management of Chronic Issues (four month recheck)   Diagnosis and orders addressed:  1. Hypertension associated  with diabetes ([place]) - CMP14+EGFR - CBC with Differential/Platelet  2. Type 2 diabetes mellitus with other specified complication, without long-term current use of insulin (HCC) - CMP14+EGFR - CBC with Differential/Platelet - hgba1c  3. Sarcoidosis - CMP14+EGFR - CBC with Differential/Platelet  4. Hyperlipidemia due to type 2 diabetes mellitus (HCC) - CMP14+EGFR - CBC with Differential/Platelet  5. Colon cancer screening - CMP14+EGFR - CBC with Differential/Platelet - Cologuard  6. Screening for malignant neoplasm of the rectum - CMP14+EGFR - CBC with Differential/Platelet - Cologuard  7. Goiter Will order [place]  - CMP14+EGFR - CBC with Differential/Platelet - TSH - US THYROID; Future  8. Need for immunization against influenza - Flu Vaccine QUAD 36+ mos IM   Labs pending Health Maintenance reviewed Diet and exercise encouraged  Follow up plan: 4 months    [name], FNP

## 2019-04-05 NOTE — Patient Instructions (Signed)
Goiter  A goiter is an enlarged thyroid gland. The thyroid is located in the lower front of the neck. It makes hormones that affect many body parts and systems, including the system that affects how quickly the body burns fuel for energy (metabolism). Most goiters are painless and are not a cause for concern. Some goiters can affect the way your thyroid makes thyroid hormones. Goiters and conditions that cause goiters can be treated, if necessary. What are the causes? Common causes of this condition include:  Lack (deficiency) of a mineral called iodine. The thyroid gland uses iodine to make thyroid hormones.  Diseases that attack healthy cells in the body (autoimmune diseases) and affect thyroid function, such as Graves' disease or Hashimoto's disease. These diseases may cause the body to produce too much thyroid hormone (hyperthyroidism) or too little of the hormone (hypothyroidism).  Conditions that cause inflammation of the thyroid (thyroiditis).  One or more small growths on the thyroid (nodular goiter). Other causes include:  Medical problems caused by abnormal genes that are passed from parent to child (genetic defects).  Thyroid injury or infection.  Tumors that may or may not be cancerous.  Pregnancy.  Certain medicines.  Exposure to radiation. In some cases, the cause may not be known. What increases the risk? This condition is more likely to develop in:  People who do not get enough iodine in their diet.  People who have a family history of goiter.  Women.  People who are older than age 40.  People who smoke tobacco.  People who have had exposure to radiation. What are the signs or symptoms? The main symptom of this condition is swelling in the lower, front part of the neck. This swelling can range from a very small bump to a large lump. Other symptoms may include:  A tight feeling in the throat.  A hoarse voice.  Coughing.  Wheezing.  Difficulty  swallowing or breathing.  Bulging veins in the neck.  Dizziness. When a goiter is the result of an overactive thyroid (hyperthyroidism), symptoms may also include:  Nervousness or restlessness.  Inability to tolerate heat.  Unexplained weight loss.  Diarrhea.  Change in the texture of hair or skin.  Changes in heartbeat, such as skipped beats, extra beats, or a rapid heart rate.  Loss of menstruation.  Shaky hands.  Increased appetite.  Sleep problems. When a goiter is the result of an underactive thyroid (hypothyroidism), symptoms may also include:  Feeling like you have no energy (lethargy).  Inability to tolerate cold.  Weight gain that is not explained by a change in diet or exercise habits.  Dry skin.  Coarse hair.  Irregular menstrual periods.  Constipation.  Sadness or depression.  Fatigue. In some cases, there may not be any symptoms and the thyroid hormone levels may be normal. How is this diagnosed? This condition may be diagnosed based on your symptoms, your medical history, and a physical exam. You may have tests, such as:  Blood tests to check thyroid function.  Imaging tests, such as: ? Ultrasound. ? CT scan. ? MRI. ? Thyroid scan.  Removal of a tissue sample (biopsy) of the goiter or any nodules. The sample will be tested to check for cancer. How is this treated? Treatment for this condition depends on the cause and your symptoms. Treatment may include:  Medicines to regulate thyroid hormone levels.  Anti-inflammatory medicines or steroid medicines, if the goiter is caused by inflammation.  Iodine supplements or changes to your   diet, if the goiter is caused by iodine deficiency.  Radioactive iodine treatment.  Surgery to remove your thyroid. In some cases, you may only need regular check-ups with your health care provider to monitor your condition, and you may not need treatment. Follow these instructions at home:  Follow  instructions from your health care provider about any changes to your diet.  Take over-the-counter and prescription medicines only as told by your health care provider. These include supplements.  Do not use any products that contain nicotine or tobacco, such as cigarettes and e-cigarettes. If you need help quitting, ask your health care provider.  Keep all follow-up visits as told by your health care provider. This is important. Contact a health care provider if:  Your symptoms do not get better with treatment.  You have nausea, vomiting, or diarrhea. Get help right away if:  You have sudden, unexplained confusion or other mental changes.  You have a fever.  You have chest pain.  You have trouble breathing or swallowing.  You suddenly become very weak.  You experience extreme restlessness.  You feel your heart racing. Summary  A goiter is an enlarged thyroid gland.  The thyroid gland is located in the lower front of the neck. It makes hormones that affect many body parts and systems, including the system that affects how quickly the body burns fuel for energy (metabolism).  The main symptom of this condition is swelling in the lower, front part of the neck. This swelling can range from a very small bump to a large lump.  Treatment for this condition depends on the cause and your symptoms. You may need medicines, supplements, or regular monitoring of your condition. This information is not intended to replace advice given to you by your health care provider. Make sure you discuss any questions you have with your health care provider. Document Revised: 02/28/2017 Document Reviewed: 12/12/2016 Elsevier Patient Education  2020 Elsevier Inc.  

## 2019-04-06 LAB — CBC WITH DIFFERENTIAL/PLATELET
Basophils Absolute: 0 10*3/uL (ref 0.0–0.2)
Basos: 1 %
EOS (ABSOLUTE): 0.3 10*3/uL (ref 0.0–0.4)
Eos: 5 %
Hematocrit: 42.1 % (ref 37.5–51.0)
Hemoglobin: 13.3 g/dL (ref 13.0–17.7)
Immature Grans (Abs): 0 10*3/uL (ref 0.0–0.1)
Immature Granulocytes: 0 %
Lymphocytes Absolute: 2 10*3/uL (ref 0.7–3.1)
Lymphs: 32 %
MCH: 26.2 pg — ABNORMAL LOW (ref 26.6–33.0)
MCHC: 31.6 g/dL (ref 31.5–35.7)
MCV: 83 fL (ref 79–97)
Monocytes Absolute: 1 10*3/uL — ABNORMAL HIGH (ref 0.1–0.9)
Monocytes: 16 %
Neutrophils Absolute: 2.9 10*3/uL (ref 1.4–7.0)
Neutrophils: 46 %
Platelets: 255 10*3/uL (ref 150–450)
RBC: 5.07 x10E6/uL (ref 4.14–5.80)
RDW: 16.9 % — ABNORMAL HIGH (ref 11.6–15.4)
WBC: 6.2 10*3/uL (ref 3.4–10.8)

## 2019-04-06 LAB — CMP14+EGFR
ALT: 17 IU/L (ref 0–44)
AST: 21 IU/L (ref 0–40)
Albumin/Globulin Ratio: 1.4 (ref 1.2–2.2)
Albumin: 4.6 g/dL (ref 3.8–4.8)
Alkaline Phosphatase: 66 IU/L (ref 39–117)
BUN/Creatinine Ratio: 22 (ref 10–24)
BUN: 35 mg/dL — ABNORMAL HIGH (ref 8–27)
Bilirubin Total: 0.5 mg/dL (ref 0.0–1.2)
CO2: 23 mmol/L (ref 20–29)
Calcium: 9.7 mg/dL (ref 8.6–10.2)
Chloride: 102 mmol/L (ref 96–106)
Creatinine, Ser: 1.57 mg/dL — ABNORMAL HIGH (ref 0.76–1.27)
GFR calc Af Amer: 54 mL/min/{1.73_m2} — ABNORMAL LOW (ref >59)
GFR calc non Af Amer: 47 mL/min/{1.73_m2} — ABNORMAL LOW (ref >59)
Globulin, Total: 3.4 g/dL (ref 1.5–4.5)
Glucose: 94 mg/dL (ref 65–99)
Potassium: 4.5 mmol/L (ref 3.5–5.2)
Sodium: 141 mmol/L (ref 134–144)
Total Protein: 8 g/dL (ref 6.0–8.5)

## 2019-04-06 LAB — TSH: TSH: 2.07 u[IU]/mL (ref 0.450–4.500)

## 2019-04-07 ENCOUNTER — Telehealth: Payer: Self-pay | Admitting: Family

## 2019-04-13 ENCOUNTER — Encounter: Payer: Self-pay | Admitting: Family Medicine

## 2019-04-16 ENCOUNTER — Ambulatory Visit (HOSPITAL_COMMUNITY): Payer: Self-pay

## 2019-04-22 ENCOUNTER — Ambulatory Visit: Payer: Self-pay

## 2019-04-26 ENCOUNTER — Ambulatory Visit (HOSPITAL_COMMUNITY)
Admission: RE | Admit: 2019-04-26 | Discharge: 2019-04-26 | Disposition: A | Discharge status: Home / Self Care | Payer: Medicare Other | Source: Ambulatory Visit | Attending: Family | Admitting: Family

## 2019-04-26 ENCOUNTER — Other Ambulatory Visit: Payer: Self-pay

## 2019-04-26 DIAGNOSIS — E049 Nontoxic goiter, unspecified: Secondary | ICD-10-CM

## 2019-05-06 ENCOUNTER — Ambulatory Visit: Payer: Medicare Other

## 2019-05-20 ENCOUNTER — Other Ambulatory Visit: Payer: Self-pay | Admitting: Family

## 2019-05-20 DIAGNOSIS — Z1212 Encounter for screening for malignant neoplasm of rectum: Secondary | ICD-10-CM | POA: Diagnosis not present

## 2019-05-20 DIAGNOSIS — Z1211 Encounter for screening for malignant neoplasm of colon: Secondary | ICD-10-CM | POA: Diagnosis not present

## 2019-05-26 ENCOUNTER — Other Ambulatory Visit: Payer: Self-pay | Admitting: Family

## 2019-06-08 LAB — COLOGUARD: Cologuard: NEGATIVE

## 2019-06-10 ENCOUNTER — Other Ambulatory Visit: Payer: Self-pay | Admitting: Family

## 2019-06-10 ENCOUNTER — Telehealth: Payer: Self-pay | Admitting: Family

## 2019-06-10 DIAGNOSIS — E1169 Type 2 diabetes mellitus with other specified complication: Secondary | ICD-10-CM

## 2019-06-10 NOTE — Chronic Care Management (AMB) (Signed)
  Chronic Care Management   Note  06/10/2019 Name: Eric Stuart MRN: [id number] DOB: 20-Jul-1955  Eric Stuart is a 65 y.o. year old male who is a primary care patient of [name], FNP. I reached out to [name] by phone today in response to a referral sent by [name] health plan.     [name] sister and caregiver [name] was given information about Chronic Care Management services today including:  1. CCM service includes personalized support from designated clinical staff supervised by his physician, including individualized plan of care and coordination with other care providers 2. 24/7 contact phone numbers for assistance for urgent and routine care needs. 3. Service will only be billed when office clinical staff spend 20 minutes or more in a month to coordinate care. 4. Only one practitioner may furnish and bill the service in a calendar month. 5. The patient may stop CCM services at any time (effective at the end of the month) by phone call to the office staff. 6. The patient will be responsible for cost sharing (co-pay) of up to 20% of the service fee (after annual deductible is met).  Patient's sister [name] agreed to services and verbal consent obtained.   Follow up plan: Telephone appointment with care management team member scheduled for:08/18/2019  Noreene Larsson, [place], [place], [place] [address] Direct Dial: [phone] [email or web address] Website: [email or web address]

## 2019-06-13 ENCOUNTER — Other Ambulatory Visit: Payer: Self-pay | Admitting: Family

## 2019-06-17 DIAGNOSIS — Z23 Encounter for immunization: Secondary | ICD-10-CM | POA: Diagnosis not present

## 2019-07-16 DIAGNOSIS — Z23 Encounter for immunization: Secondary | ICD-10-CM | POA: Diagnosis not present

## 2019-07-30 ENCOUNTER — Other Ambulatory Visit: Payer: Self-pay | Admitting: Family

## 2019-08-18 ENCOUNTER — Ambulatory Visit: Payer: Medicare Other | Admitting: *Deleted

## 2019-08-18 NOTE — Chronic Care Management (AMB) (Addendum)
  Chronic Care Management   Initial Visit Note  08/18/2019 Name: Eric Stuart MRN: [id number] DOB: Sep 14, 1955  Referred by: [name], FNP Reason for referral : Chronic Care Management (Initial Visit)   An unsuccessful Initial Telephone visit was attempted today. The patient was referred to the case management team for assistance with care management and care coordination.  RN Care Plan      Chronic Disease Management Needs       CARE PLAN ENTRY (see longtitudinal plan of care for additional care plan information)  Current Barriers:  Chronic Disease Management support, education, and care coordination needs related to HTN, DM, HLD, sarcoidosis  Clinical Goals: Over the next 10 days, patient will be contacted by a Care Guide to reschedule their Initial CCM Visit Over the next 30 days, patient will have an Initial CCM Visit with a member of the embedded CCM team to discuss self-management of their chronic medical conditions  Interventions: Chart reviewed in preparation for initial visit telephone call Collaboration with other care team members as needed Unsuccessful outreach to patient  A HIPPA compliant phone message was left for the patient providing contact information and requesting a return call.  Request sent to care guides to reach out and reschedule patient's initial visit  Patient Self Care Activities: Undetermined   Initial goal documentation           Follow Up Plan: The care management team will reach out to the patient again over the next 10 days.    Chong Sicilian, BSN, RN-BC Embedded Chronic Care Manager [facility] Direct Dial: [phone]   I have reviewed and agree with the above  documentation.   [name], FNP

## 2019-08-19 ENCOUNTER — Telehealth: Payer: Self-pay | Admitting: Family

## 2019-08-19 NOTE — Chronic Care Management (AMB) (Signed)
  Care Management   Note  08/19/2019 Name: Eric Stuart MRN: [id number] DOB: 1956-02-22  Eric Stuart is a 64 y.o. year old male who is a primary care patient of Eric Balloon, FNP and is actively engaged with the care management team. I reached out to [name] by phone today to assist with re-scheduling an initial visit with the RN Case Manager  Follow up plan: Unsuccessful telephone outreach attempt made. A HIPPA compliant phone message was left for the patient providing contact information and requesting a return call. If patient returns call to provider office, please advise to call [facility] at [phone].  [place], [place] [address] Direct Dial: [phone] [email or web address]: [email or web address]

## 2019-08-19 NOTE — Chronic Care Management (AMB) (Signed)
  Care Management   Note  08/19/2019 Name: Eric Stuart MRN: [id number] DOB: 10/11/1955  Eric Stuart is a 64 y.o. year old male who is a primary care patient of [name], FNP and is actively engaged with the care management team. I reached out to Eric Stuart by phone today to assist with re-scheduling an initial visit with the RN Case Manager  Follow up plan: Unsuccessful telephone outreach attempt made. A HIPPA compliant phone message was left for the patient providing contact information and requesting a return call.  The care management team will reach out to the patient again over the next 7 days.  If patient returns call to provider office, please advise to call [facility]  at [place], [place], [place], [place] [address] Direct Dial: [phone] [email or web address] Website: [email or web address]

## 2019-08-24 NOTE — Chronic Care Management (AMB) (Signed)
  Care Management   Note  08/24/2019 Name: Yostin Kraus MRN: [id number] DOB: 1955-04-14  Rielly Imran is a 64 y.o. year old male who is a primary care patient of [name], FNP and is actively engaged with the care management team. I reached out to [name] by phone today to assist with re-scheduling an initial visit with the Licensed Clinical Social Worker  Follow up plan: Telephone appointment with care management team member scheduled for:09/20/2019  Noreene Larsson, [place], [place], [place] [address] Direct Dial: [phone] [email or web address]: [email or web address]

## 2019-09-10 ENCOUNTER — Other Ambulatory Visit: Payer: Self-pay | Admitting: Family

## 2019-09-10 DIAGNOSIS — E1169 Type 2 diabetes mellitus with other specified complication: Secondary | ICD-10-CM

## 2019-09-20 ENCOUNTER — Ambulatory Visit (INDEPENDENT_AMBULATORY_CARE_PROVIDER_SITE_OTHER): Payer: Medicare Other | Admitting: Licensed Clinical Social Worker

## 2019-09-20 DIAGNOSIS — E785 Hyperlipidemia, unspecified: Secondary | ICD-10-CM | POA: Diagnosis not present

## 2019-09-20 DIAGNOSIS — E1169 Type 2 diabetes mellitus with other specified complication: Secondary | ICD-10-CM | POA: Diagnosis not present

## 2019-09-20 DIAGNOSIS — D869 Sarcoidosis, unspecified: Secondary | ICD-10-CM

## 2019-09-20 DIAGNOSIS — I10 Essential (primary) hypertension: Secondary | ICD-10-CM | POA: Diagnosis not present

## 2019-09-20 DIAGNOSIS — E1159 Type 2 diabetes mellitus with other circulatory complications: Secondary | ICD-10-CM

## 2019-09-20 DIAGNOSIS — I152 Hypertension secondary to endocrine disorders: Secondary | ICD-10-CM

## 2019-09-20 NOTE — Patient Instructions (Addendum)
Licensed Clinical Social Worker Visit Information  Goals we discussed today:  Goals Addressed              This Visit's Progress   .  Client will talk with [name] in next 30 days about clinet management of health needs faced (pt-stated)        CARE PLAN ENTRY   Current Barriers:  . Patient with Chronic Diagnoses of Sarcodosis, DM, HTN, HLD . Hearing difficulty  Clinical Social Work Clinical Goal(s):  Marland Kitchen [name] to call client in next 30 days to talk with client about client management of health needs faced  Interventions: . Talked with [name], sister and caregiver for client about CCM program . Talked with [name] about mobility of client . Talked with [name] about ADLs completion of client . Talked with [name] about relaxation techniques of client (likes istening to music, likes to watch car racing, likes to visit with family and friends) . Talked with [name] about upcoming medical appointments of client . Encouraged [name] or client to talk with [facility] as needed related to nursing needs of client . Talked with [name] about social support network of client (sister, [name] is supportive) . Talked with [name] about client's management of Diabetes   Patient Self Care Activities:   Completes ADLs independently Attends scheduled medical appointments   Patient Self Care Deficits:  . Hearing difficulty  Initial goal documentation       Materials Provided: No  Follow Up Plan: [name] will call client in next 4 weeks to talk with client about management of health needs faced by client.  The patient/Eric Stuart, sister, verbalized understanding of instructions provided today and declined a print copy of patient instruction materials.   Norva Riffle.Analisse Randle MSW, [name] Licensed Clinical Social Worker [facility] Care Management [phone]

## 2019-09-20 NOTE — Chronic Care Management (AMB) (Addendum)
Chronic Care Management    Clinical Social Work Follow Up Note  09/20/2019 Name: Eric Stuart MRN: [id number] DOB: 1955/08/15  Eric Stuart is a 64 y.o. year old male who is a primary care patient of Eric Balloon, FNP. The CCM team was consulted for assistance with Community resources.   Review of patient status, including review of consultants reports, other relevant assessments, and collaboration with appropriate care team members and the patient's provider was performed as part of comprehensive patient evaluation and provision of chronic care management services.    SDOH (Social Determinants of Health) assessments performed: Yes; risk for tobacco use; risk for depression; risk for transport needs  SDOH Interventions      Most Recent Value  SDOH Interventions  Depression Interventions/Treatment  --  [talked with Eric Stuart about RNCM support and [name] support for client]          Chronic Care Management from 09/20/2019 in [facility]  PHQ-9 Total Score 3      GAD 7 : Generalized Anxiety Score 09/20/2019  Nervous, Anxious, on Edge 1  Control/stop worrying 0  Worry too much - different things 0  Trouble relaxing 0  Restless 0  Easily annoyed or irritable 0  Afraid - awful might happen 0  Total GAD 7 Score 1  Anxiety Difficulty Somewhat difficult    Outpatient Encounter Medications as of 09/20/2019  Medication Sig   metFORMIN (GLUCOPHAGE) 1000 MG tablet Take 1 tablet (1,000 mg total) by mouth 2 (two) times daily with a meal. (Needs to be seen before next refill)   Olmesartan-amLODIPine-HCTZ 40-10-25 MG TABS TAKE 1 TABLET BY MOUTH EVERY DAY   rosuvastatin (CRESTOR) 10 MG tablet TAKE 1 TABLET (10 MG TOTAL) BY MOUTH EVERY EVENING. NEEDS TO BE SEEN FOR FURTHER REFILLS.   No facility-administered encounter medications on file as of 09/20/2019.     Goals Addressed               This Visit's Progress     Client will talk with [name] in next  30 days about clinet management of health needs faced (pt-stated)        CARE PLAN ENTRY   Current Barriers:  Patient with Chronic Diagnoses of Sarcodosis, DM, HTN, HLD Hearing difficulty  Clinical Social Work Clinical Goal(s):  [name] to call client in next 30 days to talk with client about client management of health needs faced  Interventions: Talked with Eric Stuart, sister and caregiver for client about CCM program Talked with Eric Stuart about mobility of client Talked with Eric Stuart about ADLs completion of client Talked with Eric Stuart about relaxation techniques of client (likes istening to music, likes to watch car racing, likes to visit with family and friends) Talked with Eric Stuart about upcoming medical appointments of client Encouraged Eric Stuart or client to talk with Eric Stuart as needed related to nursing needs of client Talked with Eric Stuart about social support network of client (sister, Eric Stuart is supportive) Talked with Eric Stuart about client's management of Diabetes   Patient Self Care Activities:   Completes ADLs independently Attends scheduled medical appointments   Patient Self Care Deficits:  Hearing difficulty  Initial goal documentation        Follow Up Plan: [name] will call client in next 4 weeks to talk with client about management of health needs faced by client  Eric Stuart.Eric Stuart MSW, [name] Licensed Clinical Social Worker [facility] [phone]  I have reviewed the CCM documentation and agree with  the written assessment and plan of care.  Eric Dun, FNP

## 2019-10-14 ENCOUNTER — Other Ambulatory Visit: Payer: Self-pay | Admitting: Family

## 2019-10-14 DIAGNOSIS — E1169 Type 2 diabetes mellitus with other specified complication: Secondary | ICD-10-CM

## 2019-10-15 NOTE — Telephone Encounter (Signed)
Hawks. NTBS 30 days given 09/10/19

## 2019-10-18 ENCOUNTER — Other Ambulatory Visit: Payer: Self-pay | Admitting: Family

## 2019-10-18 DIAGNOSIS — E1169 Type 2 diabetes mellitus with other specified complication: Secondary | ICD-10-CM

## 2019-10-18 NOTE — Telephone Encounter (Signed)
lmtcb to schedule follow up appt to get refills on his medications.

## 2019-10-21 ENCOUNTER — Emergency Department (HOSPITAL_COMMUNITY)
Admission: EM | Admit: 2019-10-21 | Discharge: 2019-10-21 | Disposition: A | Discharge status: Home / Self Care | Payer: Medicare Other | Attending: Emergency Medicine | Admitting: Emergency Medicine

## 2019-10-21 ENCOUNTER — Encounter: Payer: Self-pay | Admitting: Family

## 2019-10-21 ENCOUNTER — Other Ambulatory Visit: Payer: Self-pay

## 2019-10-21 ENCOUNTER — Emergency Department (HOSPITAL_COMMUNITY): Payer: Medicare Other

## 2019-10-21 ENCOUNTER — Ambulatory Visit (INDEPENDENT_AMBULATORY_CARE_PROVIDER_SITE_OTHER): Payer: Medicare Other | Admitting: Family

## 2019-10-21 ENCOUNTER — Other Ambulatory Visit: Payer: Self-pay | Admitting: Family

## 2019-10-21 VITALS — BP 171/86 | HR 55 | Temp 98.7°F | Ht 60.0 in | Wt 123.6 lb

## 2019-10-21 DIAGNOSIS — E785 Hyperlipidemia, unspecified: Secondary | ICD-10-CM

## 2019-10-21 DIAGNOSIS — E1159 Type 2 diabetes mellitus with other circulatory complications: Secondary | ICD-10-CM | POA: Diagnosis not present

## 2019-10-21 DIAGNOSIS — R9431 Abnormal electrocardiogram [ECG] [EKG]: Secondary | ICD-10-CM

## 2019-10-21 DIAGNOSIS — E119 Type 2 diabetes mellitus without complications: Secondary | ICD-10-CM | POA: Diagnosis not present

## 2019-10-21 DIAGNOSIS — I152 Hypertension secondary to endocrine disorders: Secondary | ICD-10-CM

## 2019-10-21 DIAGNOSIS — D869 Sarcoidosis, unspecified: Secondary | ICD-10-CM

## 2019-10-21 DIAGNOSIS — R001 Bradycardia, unspecified: Secondary | ICD-10-CM | POA: Diagnosis not present

## 2019-10-21 DIAGNOSIS — I10 Essential (primary) hypertension: Secondary | ICD-10-CM

## 2019-10-21 DIAGNOSIS — E1169 Type 2 diabetes mellitus with other specified complication: Secondary | ICD-10-CM

## 2019-10-21 DIAGNOSIS — Z87891 Personal history of nicotine dependence: Secondary | ICD-10-CM | POA: Diagnosis not present

## 2019-10-21 DIAGNOSIS — R519 Headache, unspecified: Secondary | ICD-10-CM | POA: Insufficient documentation

## 2019-10-21 DIAGNOSIS — Z7984 Long term (current) use of oral hypoglycemic drugs: Secondary | ICD-10-CM | POA: Diagnosis not present

## 2019-10-21 DIAGNOSIS — R0689 Other abnormalities of breathing: Secondary | ICD-10-CM | POA: Diagnosis not present

## 2019-10-21 DIAGNOSIS — Z79899 Other long term (current) drug therapy: Secondary | ICD-10-CM | POA: Diagnosis not present

## 2019-10-21 DIAGNOSIS — G4489 Other headache syndrome: Secondary | ICD-10-CM | POA: Diagnosis not present

## 2019-10-21 DIAGNOSIS — J9811 Atelectasis: Secondary | ICD-10-CM | POA: Diagnosis not present

## 2019-10-21 LAB — CMP14+EGFR
ALT: 10 IU/L (ref 0–44)
AST: 16 IU/L (ref 0–40)
Albumin/Globulin Ratio: 1.7 (ref 1.2–2.2)
Albumin: 4.4 g/dL (ref 3.8–4.8)
Alkaline Phosphatase: 52 IU/L (ref 48–121)
BUN/Creatinine Ratio: 11 (ref 10–24)
BUN: 13 mg/dL (ref 8–27)
Bilirubin Total: 0.6 mg/dL (ref 0.0–1.2)
CO2: 22 mmol/L (ref 20–29)
Calcium: 9.6 mg/dL (ref 8.6–10.2)
Chloride: 107 mmol/L — ABNORMAL HIGH (ref 96–106)
Creatinine, Ser: 1.21 mg/dL (ref 0.76–1.27)
GFR calc Af Amer: 73 mL/min/{1.73_m2} (ref >59)
GFR calc non Af Amer: 63 mL/min/{1.73_m2} (ref >59)
Globulin, Total: 2.6 g/dL (ref 1.5–4.5)
Glucose: 112 mg/dL — ABNORMAL HIGH (ref 65–99)
Potassium: 4.3 mmol/L (ref 3.5–5.2)
Sodium: 141 mmol/L (ref 134–144)
Total Protein: 7 g/dL (ref 6.0–8.5)

## 2019-10-21 LAB — CBC WITH DIFFERENTIAL/PLATELET
Abs Immature Granulocytes: 0.01 10*3/uL (ref 0.00–0.07)
Basophils Absolute: 0 10*3/uL (ref 0.0–0.2)
Basophils Absolute: 0 10*3/uL (ref 0.0–0.1)
Basophils Relative: 1 %
Basos: 1 %
EOS (ABSOLUTE): 0.3 10*3/uL (ref 0.0–0.4)
Eos: 5 %
Eosinophils Absolute: 0.1 10*3/uL (ref 0.0–0.5)
Eosinophils Relative: 2 %
HCT: 36.8 % — ABNORMAL LOW (ref 39.0–52.0)
Hematocrit: 37.2 % — ABNORMAL LOW (ref 37.5–51.0)
Hemoglobin: 12.4 g/dL — ABNORMAL LOW (ref 13.0–17.7)
Hemoglobin: 11.8 g/dL — ABNORMAL LOW (ref 13.0–17.0)
Immature Grans (Abs): 0 10*3/uL (ref 0.0–0.1)
Immature Granulocytes: 0 %
Immature Granulocytes: 0 %
Lymphocytes Absolute: 1.6 10*3/uL (ref 0.7–3.1)
Lymphocytes Relative: 21 %
Lymphs Abs: 0.9 10*3/uL (ref 0.7–4.0)
Lymphs: 30 %
MCH: 28.3 pg (ref 26.6–33.0)
MCH: 27.6 pg (ref 26.0–34.0)
MCHC: 33.3 g/dL (ref 31.5–35.7)
MCHC: 32.1 g/dL (ref 30.0–36.0)
MCV: 85 fL (ref 79–97)
MCV: 86 fL (ref 80.0–100.0)
Monocytes Absolute: 0.7 10*3/uL (ref 0.1–0.9)
Monocytes Absolute: 0.7 10*3/uL (ref 0.1–1.0)
Monocytes Relative: 15 %
Monocytes: 13 %
Neutro Abs: 2.7 10*3/uL (ref 1.7–7.7)
Neutrophils Absolute: 2.8 10*3/uL (ref 1.4–7.0)
Neutrophils Relative %: 61 %
Neutrophils: 51 %
Platelets: 221 10*3/uL (ref 150–450)
Platelets: 244 10*3/uL (ref 150–400)
RBC: 4.38 x10E6/uL (ref 4.14–5.80)
RBC: 4.28 MIL/uL (ref 4.22–5.81)
RDW: 16.1 % — ABNORMAL HIGH (ref 11.6–15.4)
RDW: 18 % — ABNORMAL HIGH (ref 11.5–15.5)
WBC: 5.4 10*3/uL (ref 3.4–10.8)
WBC: 4.4 10*3/uL (ref 4.0–10.5)
nRBC: 0 % (ref 0.0–0.2)

## 2019-10-21 LAB — CBG MONITORING, ED: Glucose-Capillary: 106 mg/dL — ABNORMAL HIGH (ref 70–99)

## 2019-10-21 LAB — BASIC METABOLIC PANEL
Anion gap: 7 (ref 5–15)
BUN: 12 mg/dL (ref 8–23)
CO2: 23 mmol/L (ref 22–32)
Calcium: 9.1 mg/dL (ref 8.9–10.3)
Chloride: 109 mmol/L (ref 98–111)
Creatinine, Ser: 1.18 mg/dL (ref 0.61–1.24)
GFR calc Af Amer: >60 mL/min (ref >60)
GFR calc non Af Amer: >60 mL/min (ref >60)
Glucose, Bld: 109 mg/dL — ABNORMAL HIGH (ref 70–99)
Potassium: 4.8 mmol/L (ref 3.5–5.1)
Sodium: 139 mmol/L (ref 135–145)

## 2019-10-21 LAB — LIPID PANEL
Chol/HDL Ratio: 2.1 ratio (ref 0.0–5.0)
Cholesterol, Total: 134 mg/dL (ref 100–199)
HDL: 63 mg/dL (ref >39)
LDL Chol Calc (NIH): 56 mg/dL (ref 0–99)
Triglycerides: 77 mg/dL (ref 0–149)
VLDL Cholesterol Cal: 15 mg/dL (ref 5–40)

## 2019-10-21 LAB — TROPONIN I (HIGH SENSITIVITY): Troponin I (High Sensitivity): 9 ng/L (ref <18)

## 2019-10-21 LAB — BAYER DCA HB A1C WAIVED: HB A1C (BAYER DCA - WAIVED): 6.2 % (ref <7.0)

## 2019-10-21 MED ORDER — AMLODIPINE BESYLATE 5 MG PO TABS
10.0000 mg | ORAL_TABLET | Freq: Once | ORAL | Status: AC
  Administered 2019-10-21: 10 mg via ORAL
  Filled 2019-10-21: qty 2

## 2019-10-21 MED ORDER — HYDROCHLOROTHIAZIDE 25 MG PO TABS
25.0000 mg | ORAL_TABLET | Freq: Once | ORAL | Status: AC
  Administered 2019-10-21: 25 mg via ORAL
  Filled 2019-10-21: qty 1

## 2019-10-21 MED ORDER — METFORMIN HCL 1000 MG PO TABS: ORAL_TABLET | ORAL | 2 refills | Status: DC

## 2019-10-21 MED ORDER — OLMESARTAN-AMLODIPINE-HCTZ 40-10-25 MG PO TABS: 1.0000 | ORAL_TABLET | Freq: Every day | ORAL | 1 refills | Status: DC

## 2019-10-21 MED ORDER — ROSUVASTATIN CALCIUM 10 MG PO TABS: 10.0000 mg | ORAL_TABLET | Freq: Every evening | ORAL | 2 refills | Status: DC

## 2019-10-21 NOTE — Progress Notes (Signed)
Subjective:    Patient ID: Eric Stuart, male    DOB: April 27, 1955, 64 y.o.   MRN: [id number]  Chief Complaint  Patient presents with  . Medical Management of Chronic Issues    Patient N&V with HA  . Diabetes  . Hypertension   Pt presents to the office today for chronic follow up. Pt has hx sarcoidosis. Pt's BP is elevated today, but states he has not taken his BP medication in the last two weeks. He states he has had a headache.  Diabetes He presents for his follow-up diabetic visit. He has type 2 diabetes mellitus. His disease course has been stable. Hypoglycemia symptoms include headaches. Pertinent negatives for diabetes include no blurred vision and no foot paresthesias. Symptoms are stable. Pertinent negatives for diabetic complications include no CVA, nephropathy or peripheral neuropathy. Risk factors for coronary artery disease include diabetes mellitus, dyslipidemia, hypertension, male sex and sedentary lifestyle. (Does not check blood sugars at home) An ACE inhibitor/angiotensin II receptor blocker is being taken. Eye exam is not current.  Hypertension This is a chronic problem. The current episode started more than 1 year ago. The problem has been waxing and waning since onset. The problem is uncontrolled. Associated symptoms include headaches. Pertinent negatives include no blurred vision, malaise/fatigue, peripheral edema or shortness of breath. Risk factors for coronary artery disease include dyslipidemia, male gender and sedentary lifestyle. The current treatment provides mild improvement. There is no history of kidney disease, CAD/MI or CVA.  Hyperlipidemia This is a chronic problem. The current episode started more than 1 year ago. The problem is controlled. Recent lipid tests were reviewed and are normal. Pertinent negatives include no shortness of breath. Current antihyperlipidemic treatment includes statins. The current treatment provides moderate improvement of lipids. Risk  factors for coronary artery disease include dyslipidemia, diabetes mellitus, male sex and hypertension.      Review of Systems  Constitutional: Negative for malaise/fatigue.  Eyes: Negative for blurred vision.  Respiratory: Negative for shortness of breath.   Neurological: Positive for headaches.  All other systems reviewed and are negative.      Objective:   Physical Exam Vitals reviewed.  Constitutional:      General: He is not in acute distress.    Appearance: He is well-developed.  HENT:     Head: Normocephalic.     Right Ear: Tympanic membrane normal.     Left Ear: Tympanic membrane normal.  Eyes:     General:        Right eye: No discharge.        Left eye: No discharge.     Pupils: Pupils are equal, round, and reactive to light.  Neck:     Thyroid: No thyromegaly.  Cardiovascular:     Rate and Rhythm: Normal rate and regular rhythm.     Heart sounds: Normal heart sounds. No murmur heard.   Pulmonary:     Effort: Pulmonary effort is normal. No respiratory distress.     Breath sounds: Normal breath sounds. No wheezing.  Abdominal:     General: Bowel sounds are normal. There is no distension.     Palpations: Abdomen is soft.     Tenderness: There is no abdominal tenderness.  Musculoskeletal:        General: No tenderness. Normal range of motion.     Cervical back: Normal range of motion and neck supple.  Skin:    General: Skin is warm and dry.     Findings: No erythema or  rash.  Neurological:     Mental Status: He is alert and oriented to person, place, and time.     Cranial Nerves: No cranial nerve deficit.     Deep Tendon Reflexes: Reflexes are normal and symmetric.  Psychiatric:        Behavior: Behavior normal.        Thought Content: Thought content normal.        Judgment: Judgment normal.    EKG- ST elevation  Diabetic Foot Exam - Simple   Simple Foot Form Diabetic Foot exam was performed with the following findings: Yes 10/21/2019  9:51 AM    Visual Inspection No deformities, no ulcerations, no other skin breakdown bilaterally: Yes Sensation Testing Intact to touch and monofilament testing bilaterally: Yes Pulse Check Posterior Tibialis and Dorsalis pulse intact bilaterally: Yes Comments      BP (!) 171/86   Pulse (!) 55   Temp 98.7 F (37.1 C) (Temporal)   Ht 5' (1.524 m)   Wt 123 lb 9.6 oz (56.1 kg)   SpO2 100%   BMI 24.14 kg/m      Assessment & Plan:  Eric Stuart comes in today with chief complaint of Medical Management of Chronic Issues (Patient N&V with HA), Diabetes, and Hypertension   Diagnosis and orders addressed:  1. Type 2 diabetes mellitus with other specified complication, without long-term current use of insulin (HCC) - Bayer DCA Hb A1c Waived - Microalbumin / creatinine urine ratio - metFORMIN (GLUCOPHAGE) 1000 MG tablet; TAKE 1 TABLET 2 (TWO) TIMES DAILY WITH A MEAL. (NEEDS TO BE SEEN BEFORE NEXT REFILL)  Dispense: 180 tablet; Refill: 2 - CMP14+EGFR - CBC with Differential/Platelet  2. Hypertension associated with diabetes ([place]) Pt has been out of medication for the last two weeks, will refill today and needs follow up to make sure this is stable. - Olmesartan-amLODIPine-HCTZ 40-10-25 MG TABS; Take 1 tablet by mouth daily.  Dispense: 90 tablet; Refill: 1 - CMP14+EGFR - CBC with Differential/Platelet  3. Hyperlipidemia due to type 2 diabetes mellitus (HCC) - rosuvastatin (CRESTOR) 10 MG tablet; Take 1 tablet (10 mg total) by mouth every evening. Needs to be seen for further refills.  Dispense: 90 tablet; Refill: 2 - CMP14+EGFR - CBC with Differential/Platelet - Lipid panel  4. Sarcoidosis - CMP14+EGFR - CBC with Differential/Platelet  5. Bradycardia - EKG 12-Lead  6. ST elevation Given EKG changes, EMS called and sent to ED.    Labs pending Health Maintenance reviewed Diet and exercise encouraged  Follow up plan: 2 weeks    [name], FNP

## 2019-10-21 NOTE — ED Triage Notes (Signed)
Pt was at PCP had abnormal EKG. Pt has not had BP meds for 2 weeks, only pain is h/a

## 2019-10-21 NOTE — Patient Instructions (Signed)

## 2019-10-21 NOTE — ED Provider Notes (Signed)
[facility] EMERGENCY DEPARTMENT Provider Note   CSN: [id number] Arrival date & time: 10/21/19  1134     History Chief Complaint  Patient presents with  . Abnormal ECG    Eric Stuart is a 64 y.o. male.  Eric Stuart is a 64 y.o. male with a history of hypertension, hyperlipidemia, diabetes and sarcoidosis, who presents to the emergency department via EMS from his PCPs office for evaluation of abnormal EKG.  Patient went to the office today for a routine checkup appointment for medication management, an EKG was performed which showed some ST elevations in the anterior lateral leads, patient has not had any associated chest pain, but was sent to the emergency department for further evaluation.  His only complaint is headaches over the past few weeks.  He has not taken any of his blood pressure, cholesterol or diabetes medications for the past 2 weeks.  He states that he ran out, but prior to that he is not always good about taking his medicine, because when he feels okay he forgets to take it.  He denies any associated shortness of breath, no pain in the back, arm neck or jaw.  No associated nausea or vomiting, no lightheadedness or syncope.  No associated visual changes, vomiting, numbness weakness or tingling.  No prior history of ACS.  Previous smoking history, but quit several years ago.  Denies alcohol or drug use.        Past Medical History:  Diagnosis Date  . Diabetes mellitus without complication ([place])   . Hyperlipidemia   . Hypertension   . Sarcoidosis 1985   Sister provided     Patient Active Problem List   Diagnosis Date Noted  . Hyperlipidemia due to type 2 diabetes mellitus ([place]) 04/26/2014  . SARCOIDOSIS 01/31/2009  . Diabetes mellitus ([place]) 01/31/2009  . Hypertension associated with diabetes ([place]) 01/31/2009    Past Surgical History:  Procedure Laterality Date  . ADENOIDECTOMY  childhod  . TONSILLECTOMY         Family History    Problem Relation Age of Onset  . Cancer Mother        cervical  . Peripheral Artery Disease Father   . Heart failure Father   . Diabetes Sister   . Heart attack Brother   . Heart attack Sister   . Stroke Sister     Social History   Tobacco Use  . Smoking status: Former Smoker    Packs/day: 2.00    Years: 4.00    Pack years: 8.00    Quit date: 09/29/2013    Years since quitting: 6.0  . Smokeless tobacco: Never Used  Vaping Use  . Vaping Use: Never used  Substance Use Topics  . Alcohol use: No    Alcohol/week: 0.0 standard drinks  . Drug use: No    Home Medications Prior to Admission medications   Medication Sig Start Date End Date Taking? Authorizing Provider  carbamide peroxide (EAR WAX REMOVAL DROPS) 6.5 % OTIC solution Place 5 drops into both ears daily as needed (ear wax excessive).   Yes [provider]  metFORMIN (GLUCOPHAGE) 1000 MG tablet TAKE 1 TABLET 2 (TWO) TIMES DAILY WITH A MEAL. (NEEDS TO BE SEEN BEFORE NEXT REFILL) Patient taking differently: Take 500 mg by mouth 2 (two) times daily with a meal.  10/21/19  Yes Hawks, Christy A, FNP  Olmesartan-amLODIPine-HCTZ 40-10-25 MG TABS Take 1 tablet by mouth daily. 10/21/19  Yes [name], FNP  rosuvastatin (  CRESTOR) 10 MG tablet Take 1 tablet (10 mg total) by mouth every evening. Needs to be seen for further refills. 10/21/19  Yes [name], FNP    Allergies    Patient has no known allergies.  Review of Systems   Review of Systems  Constitutional: Negative for chills and fever.  HENT: Negative.   Eyes: Negative for visual disturbance.  Respiratory: Negative for cough and shortness of breath.   Cardiovascular: Negative for chest pain.  Gastrointestinal: Negative for abdominal pain, nausea and vomiting.  Genitourinary: Negative for dysuria and frequency.  Musculoskeletal: Negative for arthralgias and myalgias.  Skin: Negative for color change and rash.  Neurological: Positive for headaches.  Negative for dizziness, syncope, facial asymmetry, weakness, light-headedness and numbness.    Physical Exam Updated Vital Signs BP (!) 176/98 (BP Location: Right Arm)   Pulse 51   Temp 98.3 F (36.8 C) (Oral)   Resp 16   Ht 5' (1.524 m)   Wt 56.1 kg   SpO2 100%   BMI 24.14 kg/m   Physical Exam Vitals and nursing note reviewed.  Constitutional:      General: He is not in acute distress.    Appearance: Normal appearance. He is well-developed and normal weight. He is not ill-appearing or diaphoretic.     Comments: Well-appearing and in no distress  HENT:     Head: Normocephalic and atraumatic.  Eyes:     General:        Right eye: No discharge.        Left eye: No discharge.     Extraocular Movements: Extraocular movements intact.     Pupils: Pupils are equal, round, and reactive to light.  Cardiovascular:     Rate and Rhythm: Normal rate and regular rhythm.     Pulses: Normal pulses.     Heart sounds: Normal heart sounds. No murmur heard.  No friction rub. No gallop.   Pulmonary:     Effort: Pulmonary effort is normal. No respiratory distress.     Breath sounds: Normal breath sounds. No wheezing or rales.     Comments: Respirations equal and unlabored, patient able to speak in full sentences, lungs clear to auscultation bilaterally Abdominal:     General: Bowel sounds are normal. There is no distension.     Palpations: Abdomen is soft. There is no mass.     Tenderness: There is no abdominal tenderness. There is no guarding.     Comments: Abdomen soft, nondistended, nontender to palpation in all quadrants without guarding or peritoneal signs  Musculoskeletal:        General: No deformity.     Cervical back: Neck supple.     Right lower leg: No edema.  Skin:    General: Skin is warm and dry.     Capillary Refill: Capillary refill takes less than 2 seconds.  Neurological:     Mental Status: He is alert.     Coordination: Coordination normal.     Comments: Speech is  clear, able to follow commands CN III-XII intact Normal strength in upper and lower extremities bilaterally including dorsiflexion and plantar flexion, strong and equal grip strength Sensation normal to light and sharp touch Moves extremities without ataxia, coordination intact  Psychiatric:        Mood and Affect: Mood normal.        Behavior: Behavior normal.     ED Results / Procedures / Treatments   Labs (all labs ordered are listed, but  only abnormal results are displayed) Labs Reviewed  BASIC METABOLIC PANEL - Abnormal; Notable for the following components:      Result Value   Glucose, Bld 109 (*)    All other components within normal limits  CBC WITH DIFFERENTIAL/PLATELET - Abnormal; Notable for the following components:   Hemoglobin 11.8 (*)    HCT 36.8 (*)    RDW 18.0 (*)    All other components within normal limits  CBG MONITORING, ED - Abnormal; Notable for the following components:   Glucose-Capillary 106 (*)    All other components within normal limits  TROPONIN I (HIGH SENSITIVITY)    EKG None  Radiology DG Chest Port 1 View  Result Date: 10/21/2019 CLINICAL DATA:  Headache, emesis, abnormal EKG EXAM: PORTABLE CHEST 1 VIEW COMPARISON:  06/11/2017 chest radiograph. FINDINGS: Stable cardiomediastinal silhouette with normal heart size. No pneumothorax. No pleural effusion. No pulmonary edema. No acute consolidative airspace disease. Mild reticular opacities at both lung bases, favor mild scarring or atelectasis. IMPRESSION: Mild reticular opacities at both lung bases, favor mild scarring or atelectasis. Electronically Signed   By: Ilona Sorrel M.D.   On: 10/21/2019 12:16    Procedures Procedures (including critical care time)  Medications Ordered in ED Medications  amLODipine (NORVASC) tablet 10 mg (10 mg Oral Given 10/21/19 1317)  hydrochlorothiazide (HYDRODIURIL) tablet 25 mg (25 mg Oral Given 10/21/19 1317)    ED Course  I have reviewed the triage vital  signs and the nursing notes.  Pertinent labs & imaging results that were available during my care of the patient were reviewed by me and considered in my medical decision making (see chart for details).    MDM Rules/Calculators/A&P                         64 year old male sent by PCP for abnormal EKG.  He went for a routine medication management appointment today, had EKG completed which showed some possible ST elevations in V4, V5 and V6.  Patient has not had any associated chest pain, he has not taken any of his blood pressure or diabetes medications for the past 2 weeks.  His only complaint is intermittent diffuse headaches.  He has no associated visual changes, facial droop, speech changes, numbness weakness or tingling.  On arrival he is well-appearing, hypertensive mildly bradycardic, all other vitals are normal.  Denies any chest pain, shortness of breath, pain in the back, nausea, vomiting, lightheadedness or syncope.  EKG with some slight ST elevations but these appear to be more so related to LVH and lateral leads, EKG reviewed with Dr. Billy Fischer, only previous EKG is available from office visit today, no new changes noted.  Will check basic labs, chest x-ray and troponin.  Lab work largely unremarkable, no leukocytosis, stable hemoglobin, glucose of 109 but no other significant electrolyte derangements, normal renal function, troponin is not elevated at 9, and patient has not had any associated chest pain or shortness of breath.  Given EKG changes seem to be more so related to LVH do not think that delta troponin is indicated.  Patient given his home blood pressure medications that he has not had in 2 weeks and BP is already starting to improve and headache is resolving.  He has no other neurologic deficits, no concern for stroke or hypertensive emergency.  At this time patient is stable for discharge home we will have him continue to follow-up with his PCP, who is already sent  in refills for  his medications, will have him follow-up with cardiology regarding abnormal EKG.  At this time there does not appear to be any evidence of an acute emergency medical condition and the patient appears stable for discharge with appropriate outpatient follow up.Diagnosis was discussed with patient who verbalizes understanding and is agreeable to discharge. Pt case discussed with Dr. Billy Fischer who agrees with my plan.  Final Clinical Impression(s) / ED Diagnoses Final diagnoses:  Abnormal EKG  Hypertension, unspecified type    Rx / DC Orders ED Discharge Orders    None       Janet Berlin 10/22/19 [id number]    Gareth Morgan, MD 10/22/19 [phone]

## 2019-10-21 NOTE — Discharge Instructions (Signed)
The abnormalities on your EKG are likely related to your long history of high blood pressure, it is extremely important that you take your blood pressure and diabetes medications daily even if you are feeling well.  Your elevated blood pressure may also be contributing to your headaches.  Follow-up with your primary care doctor as planned, they have already sent in refills for your blood pressure and diabetes medications which you need to pick up today.  I would also like for you to follow-up with her cardiologist regarding her abnormal EKG today, call the number provided on your paperwork to schedule an appointment.

## 2019-10-22 ENCOUNTER — Telehealth: Payer: Self-pay | Admitting: Family

## 2019-10-22 LAB — MICROALBUMIN / CREATININE URINE RATIO
Creatinine, Urine: 142.4 mg/dL
Microalb/Creat Ratio: 88 mg/g creat — ABNORMAL HIGH (ref 0–29)
Microalbumin, Urine: 125.8 ug/mL

## 2019-10-22 MED ORDER — AMLODIPINE BESYLATE 10 MG PO TABS
10.0000 mg | ORAL_TABLET | Freq: Every day | ORAL | 3 refills | Status: DC
Start: 2019-10-22 — End: 2020-06-01

## 2019-10-22 MED ORDER — OLMESARTAN MEDOXOMIL-HCTZ 40-25 MG PO TABS: 1.0000 | ORAL_TABLET | Freq: Every day | ORAL | 1 refills | Status: DC

## 2019-10-22 NOTE — Telephone Encounter (Signed)
Insurance will no longer cover Olmesartan-amLODIPine-HCTZ 40-10-25 MG TABS. Call Pharmacy help desk to send in an alternative [phone]

## 2019-10-22 NOTE — Telephone Encounter (Signed)
Pt aware new rx sent into the pharmacy. 

## 2019-10-22 NOTE — Telephone Encounter (Signed)
Insurance would no longer cover combo pill of Benicar/Norvasc/HCTZ. I have sent in Norvasc 10 mg tablet and Benicar/HCTZ combo prescriptions.

## 2019-10-26 ENCOUNTER — Ambulatory Visit (INDEPENDENT_AMBULATORY_CARE_PROVIDER_SITE_OTHER): Payer: Medicare Other | Admitting: Licensed Clinical Social Worker

## 2019-10-26 DIAGNOSIS — E785 Hyperlipidemia, unspecified: Secondary | ICD-10-CM | POA: Diagnosis not present

## 2019-10-26 DIAGNOSIS — E1159 Type 2 diabetes mellitus with other circulatory complications: Secondary | ICD-10-CM

## 2019-10-26 DIAGNOSIS — I10 Essential (primary) hypertension: Secondary | ICD-10-CM | POA: Diagnosis not present

## 2019-10-26 DIAGNOSIS — E1169 Type 2 diabetes mellitus with other specified complication: Secondary | ICD-10-CM | POA: Diagnosis not present

## 2019-10-26 DIAGNOSIS — D869 Sarcoidosis, unspecified: Secondary | ICD-10-CM

## 2019-10-26 DIAGNOSIS — I152 Hypertension secondary to endocrine disorders: Secondary | ICD-10-CM

## 2019-10-26 NOTE — Patient Instructions (Addendum)
Licensed Clinical Social Worker Visit Information  Goals we discussed today:    .  Client will talk with [name] in next 30 days about clinet management of health needs faced (pt-stated)        CARE PLAN ENTRY   Current Barriers:   Patient with Chronic Diagnoses of Sarcodosis, DM, HTN, HLD  Hearing difficulty  Clinical Social Work Clinical Goal(s):   [name] to call client in next 30 days to talk with client about client management of health needs faced  Interventions:  Talked with [name], sister and caregiver for client about CCM program  Talked with [name] about mobility of client  Talked with [name] about ADLs completion of client  Talked with [name] about relaxation techniques of client (likes istening to music, likes to watch car racing, likes to visit with family and friends)  Talked with [name] about upcoming medical appointments of client  Encouraged [name] or client to talk with [facility] as needed related to nursing needs of client  Talked with [name] about social support network of client (sister, [name] is supportive)  Talked with [name] about client's management of Diabetes  Talked with [name] about client procurement of prescribed medications  [name] talked with [facility] Triage Nurse, [name], about nursing needs of client  Talked with [name] about appetite of client  Talked with [name] about memory challenges of client  Patient Self Care Activities:   Completes ADLs independently Attends scheduled medical appointments  Patient Self Care Deficits:   Hearing difficulty  Initial goal documentation    Follow Up Plan:[name] will call client [name], sister, in next 4 weeks to talk with client/sister of client about management of health needs of client   Materials Provided: No  The patient Warden/ranger, sister of patient, verbalized understanding of instructions provided today and declined a print copy of patient instruction  materials.   Norva Riffle.Seibert Keeter MSW, [name] Licensed Clinical Social Worker [facility] [phone]

## 2019-10-26 NOTE — Chronic Care Management (AMB) (Addendum)
Chronic Care Management    Clinical Social Work Follow Up Note  10/26/2019 Name: Eric Stuart MRN: [id number] DOB: 10/19/1955  Eric Stuart is a 64 y.o. year old male who is a primary care patient of Eric Balloon, FNP. The CCM team was consulted for assistance with Eric Stuart .   Review of patient status, including review of consultants reports, other relevant assessments, and collaboration with appropriate care team members and the patient's provider was performed as part of comprehensive patient evaluation and provision of chronic care management services.    SDOH (Social Determinants of Health) assessments performed: No;risk for tobacco use; risk for depression; risk for physical inactivity    Chronic Care Management from 09/20/2019 in [place]  PHQ-9 Total Score 3      GAD 7 : Generalized Anxiety Score 09/20/2019  Nervous, Anxious, on Edge 1  Control/stop worrying 0  Worry too much - different things 0  Trouble relaxing 0  Restless 0  Easily annoyed or irritable 0  Afraid - awful might happen 0  Total GAD 7 Score 1  Anxiety Difficulty Somewhat difficult     Outpatient Encounter Medications as of 10/26/2019  Medication Sig Note   amLODipine (NORVASC) 10 MG tablet Take 1 tablet (10 mg total) by mouth daily.    carbamide peroxide (EAR WAX REMOVAL DROPS) 6.5 % OTIC solution Place 5 drops into both ears daily as needed (ear wax excessive).    metFORMIN (GLUCOPHAGE) 1000 MG tablet TAKE 1 TABLET 2 (TWO) TIMES DAILY WITH A MEAL. (NEEDS TO BE SEEN BEFORE NEXT REFILL) (Patient taking differently: Take 500 mg by mouth 2 (two) times daily with a meal. )    olmesartan-hydrochlorothiazide (BENICAR HCT) 40-25 MG tablet Take 1 tablet by mouth daily.    rosuvastatin (CRESTOR) 10 MG tablet Take 1 tablet (10 mg total) by mouth every evening. Needs to be seen for further refills. 10/21/2019: Has needed refill for past few weeks per spouse    No  facility-administered encounter medications on file as of 10/26/2019.    Goals       Client will talk with [name] in next 30 days about clinet management of health needs faced (pt-stated)      CARE PLAN ENTRY   Current Barriers:  Patient with Chronic Diagnoses of Sarcodosis, DM, HTN, HLD Hearing difficulty  Clinical Social Work Clinical Goal(s):  [name] to call client in next 30 days to talk with client about client management of health needs faced  Interventions: Talked with Eric Stuart, sister and caregiver for client about CCM program Talked with Eric Stuart about mobility of client Talked with Eric Stuart about ADLs completion of client Talked with Eric Stuart about relaxation techniques of client (likes istening to music, likes to watch car racing, likes to visit with family and friends) Talked with Eric Stuart about upcoming medical appointments of client Encouraged Eric Stuart or client to talk with [facility] as needed related to nursing needs of client Talked with Eric Stuart about social support network of client (sister, Eric Stuart is supportive) Talked with Eric Stuart about client's management of Diabetes Talked with Eric Stuart about client procurement of prescribed medications [name] talked with [facility] Triage Nurse, Eric Stuart, about nursing needs of client Talked with Eric Stuart about appetite of client Talked with Eric Stuart about memory challenges of client  Patient Self Care Activities:   Completes ADLs independently Attends scheduled medical appointments  Patient Self Care Deficits:  Hearing difficulty  Initial goal documentation    Follow Up Plan: [name] will call  client Eric Stuart, sister, in next 4 weeks to talk with client/sister of client about management of health needs of client   Eric Stuart MSW, [name] Licensed Clinical Social Worker [name] [phone]  I have reviewed the CCM documentation and agree with the written assessment and plan of  care.  Eric Dun, FNP

## 2019-11-04 ENCOUNTER — Ambulatory Visit (INDEPENDENT_AMBULATORY_CARE_PROVIDER_SITE_OTHER): Payer: Medicare Other | Admitting: Family

## 2019-11-04 ENCOUNTER — Encounter: Payer: Self-pay | Admitting: Family

## 2019-11-04 ENCOUNTER — Other Ambulatory Visit: Payer: Self-pay

## 2019-11-04 VITALS — BP 137/83 | HR 73 | Temp 97.3°F | Ht 60.0 in | Wt 118.6 lb

## 2019-11-04 DIAGNOSIS — F70 Mild intellectual disabilities: Secondary | ICD-10-CM

## 2019-11-04 DIAGNOSIS — Z550 Illiteracy and low-level literacy: Secondary | ICD-10-CM | POA: Diagnosis not present

## 2019-11-04 DIAGNOSIS — R413 Other amnesia: Secondary | ICD-10-CM | POA: Diagnosis not present

## 2019-11-04 NOTE — Progress Notes (Signed)
Subjective:    Patient ID: Eric Stuart, male    DOB: [birth year], 65 y.o.   MRN: [id number]  Chief Complaint  Patient presents with  . Memory Loss    HPI PT presents to the office today with his sister with complaints of memory changes over the last 6 months. She reports she will ask him to do things and he will forget to do simple tasks. Pt states he has not noticed any changes in his memory.   He has lived with his sister for the past 59 years. He has never lived by himself because of a lower IQ, but never had memory changes. Pt can not read or write.   His MME is 13. Denies any family hx of Dementia.    Review of Systems  All other systems reviewed and are negative.      Objective:   Physical Exam Vitals reviewed.  Constitutional:      General: He is not in acute distress.    Appearance: He is well-developed.  HENT:     Head: Normocephalic.     Right Ear: Tympanic membrane normal.     Left Ear: Tympanic membrane normal.  Eyes:     General:        Right eye: No discharge.        Left eye: No discharge.     Pupils: Pupils are equal, round, and reactive to light.  Neck:     Thyroid: No thyromegaly.  Cardiovascular:     Rate and Rhythm: Normal rate and regular rhythm.     Heart sounds: Normal heart sounds. No murmur heard.   Pulmonary:     Effort: Pulmonary effort is normal. No respiratory distress.     Breath sounds: Normal breath sounds. No wheezing.  Abdominal:     General: Bowel sounds are normal. There is no distension.     Palpations: Abdomen is soft.     Tenderness: There is no abdominal tenderness.  Musculoskeletal:        General: No tenderness. Normal range of motion.     Cervical back: Normal range of motion and neck supple.  Skin:    General: Skin is warm and dry.     Findings: No erythema or rash.  Neurological:     Mental Status: He is alert and oriented to person, place, and time.     Cranial Nerves: No cranial nerve deficit.     Deep  Tendon Reflexes: Reflexes are normal and symmetric.  Psychiatric:        Mood and Affect: Affect is flat.        Behavior: Behavior normal.        Thought Content: Thought content normal.        Judgment: Judgment normal.      BP 137/83   Pulse 73   Temp (!) 97.3 F (36.3 C) (Temporal)   Ht 5' (1.524 m)   Wt 118 lb 9.6 oz (53.8 kg)   SpO2 100%   BMI 23.16 kg/m       Assessment & Plan:  Eric Stuart comes in today with chief complaint of Memory Loss   Diagnosis and orders addressed:  1. Memory change - Ambulatory referral to Neurology  2. Illiterate  3. Mild mental slowing   I will place referral to Neurologists. Given patient's illiterate his MME is not accurate. However, per his sister he is having memory changes. Will let Neurologists start medications if needed.    [employer]  Eric Stuart, Eric Stuart

## 2019-11-04 NOTE — Patient Instructions (Signed)

## 2019-11-22 DIAGNOSIS — F0390 Unspecified dementia without behavioral disturbance: Secondary | ICD-10-CM | POA: Diagnosis not present

## 2019-11-22 DIAGNOSIS — E119 Type 2 diabetes mellitus without complications: Secondary | ICD-10-CM | POA: Diagnosis not present

## 2019-11-22 DIAGNOSIS — I10 Essential (primary) hypertension: Secondary | ICD-10-CM | POA: Diagnosis not present

## 2019-11-22 DIAGNOSIS — Q860 Fetal alcohol syndrome (dysmorphic): Secondary | ICD-10-CM | POA: Insufficient documentation

## 2019-11-24 DIAGNOSIS — F0390 Unspecified dementia without behavioral disturbance: Secondary | ICD-10-CM | POA: Diagnosis not present

## 2019-11-29 ENCOUNTER — Ambulatory Visit (INDEPENDENT_AMBULATORY_CARE_PROVIDER_SITE_OTHER): Payer: Medicare Other | Admitting: Licensed Clinical Social Worker

## 2019-11-29 DIAGNOSIS — I10 Essential (primary) hypertension: Secondary | ICD-10-CM | POA: Diagnosis not present

## 2019-11-29 DIAGNOSIS — E1169 Type 2 diabetes mellitus with other specified complication: Secondary | ICD-10-CM | POA: Diagnosis not present

## 2019-11-29 DIAGNOSIS — I152 Hypertension secondary to endocrine disorders: Secondary | ICD-10-CM

## 2019-11-29 DIAGNOSIS — D869 Sarcoidosis, unspecified: Secondary | ICD-10-CM

## 2019-11-29 DIAGNOSIS — E1159 Type 2 diabetes mellitus with other circulatory complications: Secondary | ICD-10-CM

## 2019-11-29 DIAGNOSIS — E785 Hyperlipidemia, unspecified: Secondary | ICD-10-CM | POA: Diagnosis not present

## 2019-11-29 NOTE — Chronic Care Management (AMB) (Addendum)
Chronic Care Management    Clinical Social Work Follow Up Note  11/29/2019 Name: Eric Stuart MRN: [id number] DOB: 02-27-1956  Eric Stuart is a 64 y.o. year old male who is a primary care patient of Eric Balloon, FNP. The CCM team was consulted for assistance with Eric Stuart .   Review of patient status, including review of consultants reports, other relevant assessments, and collaboration with appropriate care team members and the patient's provider was performed as part of comprehensive patient evaluation and provision of chronic care management services.    SDOH (Social Determinants of Health) assessments performed: No;risk for tobacco use; risk for depression; risk for stress; risk for physical inactivity    Chronic Care Management from 09/20/2019 in [place]  PHQ-9 Total Score 3       GAD 7 : Generalized Anxiety Score 09/20/2019  Nervous, Anxious, on Edge 1  Control/stop worrying 0  Worry too much - different things 0  Trouble relaxing 0  Restless 0  Easily annoyed or irritable 0  Afraid - awful might happen 0  Total GAD 7 Score 1  Anxiety Difficulty Somewhat difficult     Outpatient Encounter Medications as of 11/29/2019  Medication Sig Note   amLODipine (NORVASC) 10 MG tablet Take 1 tablet (10 mg total) by mouth daily.    carbamide peroxide (EAR WAX REMOVAL DROPS) 6.5 % OTIC solution Place 5 drops into both ears daily as needed (ear wax excessive).    metFORMIN (GLUCOPHAGE) 1000 MG tablet TAKE 1 TABLET 2 (TWO) TIMES DAILY WITH A MEAL. (NEEDS TO BE SEEN BEFORE NEXT REFILL) (Patient taking differently: Take 500 mg by mouth 2 (two) times daily with a meal. )    olmesartan-hydrochlorothiazide (BENICAR HCT) 40-25 MG tablet Take 1 tablet by mouth daily.    rosuvastatin (CRESTOR) 10 MG tablet Take 1 tablet (10 mg total) by mouth every evening. Needs to be seen for further refills. 10/21/2019: Has needed refill for past few weeks per spouse      No facility-administered encounter medications on file as of 11/29/2019.    Goals       Client will talk with [name] in next 30 days about clinet management of health needs faced (pt-stated)      CARE PLAN ENTRY   Current Barriers:  Patient with Chronic Diagnoses of Sarcodosis, DM, HTN, HLD Hearing difficulty  Clinical Social Work Clinical Goal(s):  [name] to call client in next 30 days to talk with client about client management of health needs faced  Interventions: Talked with Eric Stuart, sister and caregiver for client about CCM program Talked with Eric Stuart about mobility of client Talked with Eric Stuart about ADLs completion of client Talked with Eric Stuart about relaxation techniques of client (likes istening to music, likes to watch car racing, likes to visit with family and friends) Talked with Eric Stuart about upcoming medical appointments of client Encouraged Eric Stuart or client to talk with [facility] as needed related to nursing needs of client Talked with Eric Stuart about social support network of client (sister, Eric Stuart is supportive) Talked with Eric Stuart about client's management of Diabetes Talked with Eric Stuart about client's recent appointment with neurologist Talked with Eric Stuart about vision of client Talked with Eric Stuart about memory challenges of client Talked with Eric Stuart about literacy issue of client Talked with Eric Stuart about health needs of her sister Talked with Eric Stuart about client medication administration  Patient Self Care Activities:   Completes ADLs independently Attends scheduled medical appointments   Patient Self Care  Deficits:  Hearing difficulty  Initial goal documentation    Follow Up Plan: [name] will call client Eric Stuart, sister, in next 4 weeks to talk with client/sister of client about management of health needs of client    Eric Stuart MSW, [name] Licensed Clinical Social Worker [facility] Care [facility] [phone]  I have  reviewed the CCM documentation and agree with the written assessment and plan of care.  Eric Dun, FNP

## 2019-11-29 NOTE — Patient Instructions (Addendum)
Licensed Clinical Social Worker Visit Information  Goals we discussed today:    .  Client will talk with [name] in next 30 days about clinet management of health needs faced (pt-stated)       CARE PLAN ENTRY   Current Barriers:   Patient with Chronic Diagnoses of Sarcodosis, DM, HTN, HLD  Hearing difficulty  Clinical Social Work Clinical Goal(s):   [name] to call client in next 30 days to talk with client about client management of health needs faced  Interventions:  Talked with [name], sister and caregiver for client about CCM program  Talked with [name] about mobility of client  Talked with [name] about ADLs completion of client  Talked with [name] about relaxation techniques of client (likes istening to music, likes to watch car racing, likes to visit with family and friends)  Talked with [name] about upcoming medical appointments of client  Encouraged [name] or client to talk with [facility] as needed related to nursing needs of client  Talked with [name] about social support network of client (sister, [name] is supportive)  Talked with [name] about client's management of Diabetes  Talked with [name] about client's recent appointment with neurologist  Talked with [name] about vision of client  Talked with [name] about memory challenges of client  Talked with [name] about literacy issue of client  Talked with [name] about health needs of her sister  Talked with [name] about client medication administration  Patient Self Care Activities:   Completes ADLs independently Attends scheduled medical appointments   Patient Self Care Deficits:   Hearing difficulty  Initial goal documentation    Follow Up Plan: [name] will call client/Gloria McKiver, sister,in next 4 weeks to talk with client/sister of clientabout management of health needs of client  Materials Provided: No  The patient [name], sister of client, verbalized understanding  of instructions provided today and declined a print copy of patient instruction materials.   Norva Riffle.Jakirah Zaun MSW, [name] Licensed Clinical Social Worker [facility] Care Management [phone]

## 2019-12-07 ENCOUNTER — Ambulatory Visit (INDEPENDENT_AMBULATORY_CARE_PROVIDER_SITE_OTHER): Payer: Medicare Other | Admitting: *Deleted

## 2019-12-07 DIAGNOSIS — E1169 Type 2 diabetes mellitus with other specified complication: Secondary | ICD-10-CM

## 2019-12-07 DIAGNOSIS — R413 Other amnesia: Secondary | ICD-10-CM

## 2019-12-07 DIAGNOSIS — I152 Hypertension secondary to endocrine disorders: Secondary | ICD-10-CM

## 2019-12-07 DIAGNOSIS — E1159 Type 2 diabetes mellitus with other circulatory complications: Secondary | ICD-10-CM | POA: Diagnosis not present

## 2019-12-07 DIAGNOSIS — I10 Essential (primary) hypertension: Secondary | ICD-10-CM

## 2019-12-17 NOTE — Chronic Care Management (AMB) (Addendum)
Chronic Care Management   Follow Up Note   12/07/2019 Name: Eric Stuart MRN: [id number] DOB: November 09, 1955  Referred by: Eric Balloon, FNP Reason for referral : Chronic Care Management   Eric Stuart is a 64 y.o. year old male who is a primary care patient of Eric Balloon, FNP. The CCM team was consulted for assistance with chronic disease management and care coordination needs.    Review of patient status, including review of consultants reports, relevant laboratory and other test results, and collaboration with appropriate care team members and the patient's provider was performed as part of comprehensive patient evaluation and provision of chronic care management services.    SDOH (Social Determinants of Health) assessments performed: No See Care Plan activities for detailed interventions related to [facility])      Outpatient Encounter Medications as of 12/07/2019  Medication Sig Note   amLODipine (NORVASC) 10 MG tablet Take 1 tablet (10 mg total) by mouth daily.    carbamide peroxide (EAR WAX REMOVAL DROPS) 6.5 % OTIC solution Place 5 drops into both ears daily as needed (ear wax excessive).    metFORMIN (GLUCOPHAGE) 1000 MG tablet TAKE 1 TABLET 2 (TWO) TIMES DAILY WITH A MEAL. (NEEDS TO BE SEEN BEFORE NEXT REFILL) (Patient taking differently: Take 500 mg by mouth 2 (two) times daily with a meal. )    olmesartan-hydrochlorothiazide (BENICAR HCT) 40-25 MG tablet Take 1 tablet by mouth daily.    rosuvastatin (CRESTOR) 10 MG tablet Take 1 tablet (10 mg total) by mouth every evening. Needs to be seen for further refills. 10/21/2019: Has needed refill for past few weeks per spouse    No facility-administered encounter medications on file as of 12/07/2019.    BP Readings from Last 3 Encounters:  11/04/19 137/83  10/21/19 (!) 167/99  10/21/19 (!) 171/86    RN Care Plan: Goals Addressed             This Visit's Progress    Chronic Disease Management Needs       CARE PLAN  ENTRY (see longtitudinal plan of care for additional care plan information)  Current Barriers:  Chronic Disease Management support, education, and care coordination needs related to HTN, DM, HLD, sarcoidosis, memory change, illiterate  Clinical Goal(s) related to HTN, DM, HLD, sarcoidosis, memory change, illiterate:  Over the next 30 days, patient will:  Work with the care management team to address educational, disease management, and care coordination needs  Call care management team with questions or concerns  Interventions related to HTN, DM, HLD, sarcoidosis, memory change, illiterate:  Evaluation of current treatment plans and patient's adherence to plan as established by provider Assessed patient understanding of disease states Assessed patient's education and care coordination needs Provided disease specific education to patient  Collaborated with appropriate clinical care team members regarding patient needs Chart reviewed including recent office notes and referral to neurology Collaboration with [name] Encouraged patient/sister to reach out to CCM team as needed  Patient Self Care Activities related to HTN, DM, HLD, sarcoidosis, memory change, illiterate:  Patient is unable to independently self-manage chronic health conditions  Initial goal documentation          Plan:   The care management team will reach out to the patient again over the next 30 days.    Chong Sicilian, BSN, RN-BC Embedded Chronic Care Manager [facility] [place] Management Direct Dial: [phone]     I have reviewed the CCM documentation and  agree with the written assessment and plan of care.  [name], FNP

## 2019-12-17 NOTE — Patient Instructions (Signed)
Visit Information  Goals Addressed            This Visit's Progress    Chronic Disease Management Needs       CARE PLAN ENTRY (see longtitudinal plan of care for additional care plan information)  Current Barriers:   Chronic Disease Management support, education, and care coordination needs related to HTN, DM, HLD, sarcoidosis, memory change, illiterate  Clinical Goal(s) related to HTN, DM, HLD, sarcoidosis, memory change, illiterate:  Over the next 30 days, patient will:   Work with the care management team to address educational, disease management, and care coordination needs   Call care management team with questions or concerns  Interventions related to HTN, DM, HLD, sarcoidosis, memory change, illiterate:   Evaluation of current treatment plans and patient's adherence to plan as established by provider  Assessed patient understanding of disease states  Assessed patient's education and care coordination needs  Provided disease specific education to patient   Collaborated with appropriate clinical care team members regarding patient needs  Chart reviewed including recent office notes and referral to neurology  Collaboration with [name]  Encouraged patient/sister to reach out to CCM team as needed  Patient Self Care Activities related to HTN, DM, HLD, sarcoidosis, memory change, illiterate:   Patient is unable to independently self-manage chronic health conditions  Initial goal documentation        Patient verbalizes understanding of instructions provided today.   Follow-up Plan The care management team will reach out to the patient again over the next 30 days.   Chong Sicilian, BSN, RN-BC Embedded Chronic Care Manager [facility] [place] [facility] Direct Dial: [phone]

## 2020-01-03 ENCOUNTER — Ambulatory Visit (INDEPENDENT_AMBULATORY_CARE_PROVIDER_SITE_OTHER): Payer: Medicare Other | Admitting: Licensed Clinical Social Worker

## 2020-01-03 DIAGNOSIS — E1159 Type 2 diabetes mellitus with other circulatory complications: Secondary | ICD-10-CM

## 2020-01-03 DIAGNOSIS — I152 Hypertension secondary to endocrine disorders: Secondary | ICD-10-CM | POA: Diagnosis not present

## 2020-01-03 DIAGNOSIS — D869 Sarcoidosis, unspecified: Secondary | ICD-10-CM

## 2020-01-03 DIAGNOSIS — E785 Hyperlipidemia, unspecified: Secondary | ICD-10-CM

## 2020-01-03 DIAGNOSIS — E1169 Type 2 diabetes mellitus with other specified complication: Secondary | ICD-10-CM | POA: Diagnosis not present

## 2020-01-03 NOTE — Patient Instructions (Addendum)
Licensed Clinical Education officer, museum Visit Information  Goals we discussed today:    Client will talk with [name] in next 30 days about clinet management of health needs faced (pt-stated)         CARE PLAN ENTRY   Current Barriers:   Patient with Chronic Diagnoses of Sarcodosis, DM, HTN, HLD  Hearing difficulty  Clinical Social Work Clinical Goal(s):   [name] to call client in next 30 days to talk with client about client management of health needs faced  Interventions:  Talked with [name], sister and caregiver for client about CCM program  Talked with [name] about mobility of client  Talked with [name] about ADLs completion of client  Talked with [name] about relaxation techniques of client (likes istening to music, likes to watch car racing, likes to visit with family and friends)  Talked with [name] about upcoming medical appointments of client  Encouraged [name] or client to talk with [facility] as needed related to nursing needs of client  Talked with [name] about social support network of client (sister, [name] is supportive)  Talked with [name] about client's management of Diabetes  Talked with [name] about sleeping issues of client  Talked with [name] about client's upcoming appointment with Dr. Merlene Laughter on October 19,2021         (neurology)  Talked with [name] about client appetite  Talked with [name] about vision of client  Talked with [name] about memory challenges of client  Talked with [name] about transport needs of client  Patient Self Care Activities:   Completes ADLs independently Attends scheduled medical appointments   Patient Self Care Deficits:   Hearing difficulty  Initial goal documentation       Follow Up Plan: [name] will call client/Gloria McKiver, sister,in next 4 weeks to talk with client/sister of clientabout management of health needs of client  Materials Provided: No  The patient/Gloria McKiver, sister of  client, verbalized understanding of instructions provided today and declined a print copy of patient instruction materials.   Norva Riffle.Nicasio Barlowe MSW, [name] Licensed Clinical Social Worker [facility] [phone]

## 2020-01-03 NOTE — Chronic Care Management (AMB) (Addendum)
Chronic Care Management    Clinical Social Work Follow Up Note  01/03/2020 Name: Eric Stuart MRN: [id number] DOB: 10-Jul-1955  Eric Stuart is a 64 y.o. year old male who is a primary care patient of [name], FNP. The CCM team was consulted for assistance with [name] .   Review of patient status, including review of consultants reports, other relevant assessments, and collaboration with appropriate care team members and the patient's provider was performed as part of comprehensive patient evaluation and provision of chronic care management services.    SDOH (Social Determinants of Health) assessments performed: No;risk for depression; risk for tobacco use; risk for stress; risk for physical inactivity    Chronic Care Management from 09/20/2019 in [facility]  PHQ-9 Total Score 3      GAD 7 : Generalized Anxiety Score 09/20/2019  Nervous, Anxious, on Edge 1  Control/stop worrying 0  Worry too much - different things 0  Trouble relaxing 0  Restless 0  Easily annoyed or irritable 0  Afraid - awful might happen 0  Total GAD 7 Score 1  Anxiety Difficulty Somewhat difficult     Outpatient Encounter Medications as of 01/03/2020  Medication Sig Note   amLODipine (NORVASC) 10 MG tablet Take 1 tablet (10 mg total) by mouth daily.    carbamide peroxide (EAR WAX REMOVAL DROPS) 6.5 % OTIC solution Place 5 drops into both ears daily as needed (ear wax excessive).    metFORMIN (GLUCOPHAGE) 1000 MG tablet TAKE 1 TABLET 2 (TWO) TIMES DAILY WITH A MEAL. (NEEDS TO BE SEEN BEFORE NEXT REFILL) (Patient taking differently: Take 500 mg by mouth 2 (two) times daily with a meal. )    olmesartan-hydrochlorothiazide (BENICAR HCT) 40-25 MG tablet Take 1 tablet by mouth daily.    rosuvastatin (CRESTOR) 10 MG tablet Take 1 tablet (10 mg total) by mouth every evening. Needs to be seen for further refills. 10/21/2019: Has needed refill for past few weeks per spouse     No facility-administered encounter medications on file as of 01/03/2020.    Goals       Client will talk with [name] in next 30 days about clinet management of health needs faced (pt-stated)      CARE PLAN ENTRY   Current Barriers:  Patient with Chronic Diagnoses of Sarcodosis, DM, HTN, HLD Hearing difficulty  Clinical Social Work Clinical Goal(s):  [name] to call client in next 30 days to talk with client about client management of health needs faced  Interventions: Talked with Eric Stuart, sister and caregiver for client about CCM program Talked with Eric Stuart about mobility of client Talked with Eric Stuart about ADLs completion of client Talked with Eric Stuart about relaxation techniques of client (likes istening to music, likes to watch car racing, likes to visit with family and friends) Talked with Eric Stuart about upcoming medical appointments of client Encouraged Eric Stuart or client to talk with [facility] as needed related to nursing needs of client Talked with Eric Stuart about social support network of client (sister, Eric Stuart is supportive) Talked with Eric Stuart about client's management of Diabetes Talked with Eric Stuart about sleeping issues of client Talked with Eric Stuart about client's upcoming appointment with Dr. Merlene Laughter on October 19,2021         (neurology) Talked with Eric Stuart about client appetite Talked with Eric Stuart about vision of client Talked with Eric Stuart about memory challenges of client Talked with Eric Stuart about transport needs of client  Patient Self Care Activities:   Completes ADLs independently  Attends scheduled medical appointments   Patient Self Care Deficits:  Hearing difficulty  Initial goal documentation        Follow Up Plan:  [name] will call client Eric Stuart, sister, in next 4 weeks to talk with client/sister of client about management of health needs of client   Norva Riffle.Alexey Rhoads MSW, [name] Licensed Clinical Social Worker [facility] Care  Management [phone]  I have reviewed the CCM documentation and agree with the written assessment and plan of care.  [name], FNP

## 2020-01-18 ENCOUNTER — Other Ambulatory Visit (HOSPITAL_COMMUNITY): Payer: Self-pay | Admitting: Neurology

## 2020-01-18 DIAGNOSIS — E119 Type 2 diabetes mellitus without complications: Secondary | ICD-10-CM | POA: Diagnosis not present

## 2020-01-18 DIAGNOSIS — F0390 Unspecified dementia without behavioral disturbance: Secondary | ICD-10-CM

## 2020-01-18 DIAGNOSIS — G309 Alzheimer's disease, unspecified: Secondary | ICD-10-CM | POA: Diagnosis not present

## 2020-01-18 DIAGNOSIS — Q860 Fetal alcohol syndrome (dysmorphic): Secondary | ICD-10-CM | POA: Diagnosis not present

## 2020-01-18 DIAGNOSIS — F0280 Dementia in other diseases classified elsewhere without behavioral disturbance: Secondary | ICD-10-CM | POA: Insufficient documentation

## 2020-01-18 DIAGNOSIS — I10 Essential (primary) hypertension: Secondary | ICD-10-CM | POA: Diagnosis not present

## 2020-01-27 ENCOUNTER — Other Ambulatory Visit: Payer: Self-pay

## 2020-01-27 ENCOUNTER — Ambulatory Visit (HOSPITAL_COMMUNITY)
Admission: RE | Admit: 2020-01-27 | Discharge: 2020-01-27 | Disposition: A | Discharge status: Home / Self Care | Payer: Medicare Other | Source: Ambulatory Visit | Attending: Neurology | Admitting: Neurology

## 2020-01-27 DIAGNOSIS — F0390 Unspecified dementia without behavioral disturbance: Secondary | ICD-10-CM | POA: Diagnosis not present

## 2020-01-27 DIAGNOSIS — I6782 Cerebral ischemia: Secondary | ICD-10-CM | POA: Diagnosis not present

## 2020-01-27 DIAGNOSIS — G319 Degenerative disease of nervous system, unspecified: Secondary | ICD-10-CM | POA: Diagnosis not present

## 2020-01-27 DIAGNOSIS — H748X3 Other specified disorders of middle ear and mastoid, bilateral: Secondary | ICD-10-CM | POA: Diagnosis not present

## 2020-02-08 ENCOUNTER — Telehealth: Payer: Medicare Other

## 2020-02-19 DIAGNOSIS — Z23 Encounter for immunization: Secondary | ICD-10-CM | POA: Diagnosis not present

## 2020-03-02 ENCOUNTER — Telehealth: Payer: Medicare Other | Admitting: *Deleted

## 2020-03-02 ENCOUNTER — Telehealth: Payer: Self-pay | Admitting: *Deleted

## 2020-03-03 NOTE — Telephone Encounter (Signed)
  Chronic Care Management   Outreach Note  03/02/2020 Name: Eric Stuart MRN: [id number] DOB: [birth year]  Referred by: [name], FNP Reason for referral : Chronic Care Management (RN Follow up)   An unsuccessful telephone follow-up was attempted today. The patient was referred to the case management team for assistance with care management and care coordination.   Follow Up Plan: A HIPAA compliant phone message was left for the patient providing contact information and requesting a return call.  The care management team will reach out to the patient again over the next 30 days.   Chong Sicilian, BSN, RN-BC Embedded Chronic Care Manager [facility] [place] Management Direct Dial: [phone]

## 2020-03-09 NOTE — Telephone Encounter (Signed)
Rescheduled for call with Chong Sicilian RN CM 03/30/2020

## 2020-03-09 NOTE — Telephone Encounter (Signed)
R/s

## 2020-03-14 ENCOUNTER — Telehealth: Payer: Medicare Other

## 2020-03-14 DIAGNOSIS — I10 Essential (primary) hypertension: Secondary | ICD-10-CM | POA: Diagnosis not present

## 2020-03-14 DIAGNOSIS — Q860 Fetal alcohol syndrome (dysmorphic): Secondary | ICD-10-CM | POA: Diagnosis not present

## 2020-03-14 DIAGNOSIS — E119 Type 2 diabetes mellitus without complications: Secondary | ICD-10-CM | POA: Diagnosis not present

## 2020-03-14 DIAGNOSIS — G309 Alzheimer's disease, unspecified: Secondary | ICD-10-CM | POA: Diagnosis not present

## 2020-03-16 ENCOUNTER — Ambulatory Visit (INDEPENDENT_AMBULATORY_CARE_PROVIDER_SITE_OTHER): Payer: Medicare Other | Admitting: *Deleted

## 2020-03-16 DIAGNOSIS — Z0000 Encounter for general adult medical examination without abnormal findings: Secondary | ICD-10-CM

## 2020-03-16 NOTE — Progress Notes (Signed)
MEDICARE ANNUAL WELLNESS VISIT  03/16/2020  Telephone Visit Disclaimer This Medicare AWV was conducted by telephone due to national recommendations for restrictions regarding the COVID-19 Pandemic (e.g. social distancing).  I verified, using two identifiers, that I am speaking with Eric Stuart or their authorized healthcare agent. I discussed the limitations, risks, security, and privacy concerns of performing an evaluation and management service by telephone and the potential availability of an in-person appointment in the future. The patient expressed understanding and agreed to proceed.  Location of Patient: Home- Visit completed by [facility] Elinor Dodge Location of Provider (nurse):  [facility]  Subjective:    Eric Stuart is a 64 y.o. male patient of Hawks, Theador Hawthorne, FNP who had a Medicare Annual Wellness Visit today via telephone. Eric Stuart is disabled and lives with his sister [name] who is also his POA. His younger disabled sister resides with them as well.  Vick has never been married and does not have any children. [name] reports that he is socially active and does interact with friends/family regularly. He is moderately physically active. He rides his bike and walks all over town. He also enjoys watching Geographical information systems officer on TV.  Patient Care Team: [name], FNP as PCP - General (Nurse Practitioner) Melina Schools, OD (Optometry) Ilean China, RN as Case Manager [place], Norva Riffle, [name] as Social Worker (Licensed Clinical Social Worker)  Advanced Directives 03/16/2020 10/21/2019 02/13/2018  Does Patient Have a Medical Advance Directive? Yes No No  Type of Paramedic of [place];Living will;Out of facility DNR (pink MOST or yellow form) - -  Copy of [place] in Chart? Yes - validated most recent copy scanned in chart (See row information) - -  Would patient like information on creating a medical advance directive? - No -  Patient declined Yes (MAU/Ambulatory/Procedural Areas - Information given)    Hospital Utilization Over the Past 12 Months: # of hospitalizations or ER visits: 0 # of surgeries: 0  Review of Systems    Patient reports that his overall health is unchanged compared to last year.  History obtained from patient's POA and patient chart.  Patient Reported Readings (BP, Pulse, CBG, Weight, etc) none  Pain Assessment Pain : No/denies pain     Current Medications & Allergies (verified) Allergies as of 03/16/2020   No Known Allergies     Medication List       Accurate as of March 16, 2020  9:00 AM. If you have any questions, ask your nurse or doctor.        amLODipine 10 MG tablet Commonly known as: NORVASC Take 1 tablet (10 mg total) by mouth daily.   donepezil 10 MG tablet Commonly known as: ARICEPT Take 10 mg by mouth at bedtime.   Ear Wax Removal Drops 6.5 % OTIC solution Generic drug: carbamide peroxide Place 5 drops into both ears daily as needed (ear wax excessive).   metFORMIN 1000 MG tablet Commonly known as: GLUCOPHAGE TAKE 1 TABLET 2 (TWO) TIMES DAILY WITH A MEAL. (NEEDS TO BE SEEN BEFORE NEXT REFILL) What changed:   how much to take  how to take this  when to take this  additional instructions   olmesartan-hydrochlorothiazide 40-25 MG tablet Commonly known as: BENICAR HCT Take 1 tablet by mouth daily.   rosuvastatin 10 MG tablet Commonly known as: CRESTOR Take 1 tablet (10 mg total) by mouth every evening. Needs to be seen for further refills.  History (reviewed): Past Medical History:  Diagnosis Date  . Dementia ([place])   . Diabetes mellitus without complication ([place])   . Hyperlipidemia   . Hypertension   . Sarcoidosis 1985   Sister provided    Past Surgical History:  Procedure Laterality Date  . ADENOIDECTOMY  childhod  . TONSILLECTOMY     Family History  Problem Relation Age of Onset  . Cancer Mother        cervical  .  Peripheral Artery Disease Father   . Heart failure Father   . Diabetes Sister   . Heart attack Brother   . Heart attack Sister   . Stroke Sister    Social History   Socioeconomic History  . Marital status: Single    Spouse name: Not on file  . Number of children: 0  . Years of education: 91  . Highest education level: 12th grade  Occupational History  . Occupation: disabled  Tobacco Use  . Smoking status: Former Smoker    Packs/day: 2.00    Years: 4.00    Pack years: 8.00    Quit date: 09/29/2013    Years since quitting: 6.4  . Smokeless tobacco: Never Used  Vaping Use  . Vaping Use: Never used  Substance and Sexual Activity  . Alcohol use: No    Alcohol/week: 0.0 standard drinks  . Drug use: No  . Sexual activity: Not Currently  Other Topics Concern  . Not on file  Social History Narrative   Patient is disabled and lives at home with his sister. He is very active and walks or bicycles around town. He does this daily and probably averages about 50 miles a week on his bike. He is not married and does not have any children.    Social Determinants of Health   Financial Resource Strain: Not on file  Food Insecurity: Not on file  Transportation Needs: Not on file  Physical Activity: Not on file  Stress: Not on file  Social Connections: Not on file    Activities of Daily Living In your present state of health, do you have any difficulty performing the following activities: 03/16/2020  Hearing? Y  Vision? N  Difficulty concentrating or making decisions? Y  Comment dementia  Walking or climbing stairs? N  Dressing or bathing? N  Doing errands, shopping? Y  Comment sister assists  Preparing Food and eating ? Y  Comment does not prepare  Using the Toilet? N  In the past six months, have you accidently leaked urine? N  Do you have problems with loss of bowel control? N  Managing your Medications? Y  Comment sister manages  Managing your Finances? Y  Comment sister  Engineer, production or managing your Housekeeping? Y  Comment sister manages  Some recent data might be hidden    Patient Education/ Literacy How often do you need to have someone help you when you read instructions, pamphlets, or other written materials from your doctor or pharmacy?: 5 - Always What is the last grade level you completed in school?: high school  Exercise Current Exercise Habits: Home exercise routine, Type of exercise: walking (rides bike all over town and walks several hours per day), Time (Minutes): > 60, Frequency (Times/Week): 7, Weekly Exercise (Minutes/Week): 0, Intensity: Moderate, Exercise limited by: None identified  Diet Patient reports consuming 3 meals a day and 2 snack(s) a day Patient reports that his primary diet is: Regular Patient reports that she does have regular access to  food.   Depression Screen PHQ 2/9 Scores 03/16/2020 11/04/2019 10/21/2019 09/20/2019 04/05/2019 12/01/2018 08/31/2018  PHQ - 2 Score 0 0 0 0 0 0 0  PHQ- 9 Score - - - 3 - - -     Fall Risk Fall Risk  03/16/2020 11/04/2019 10/21/2019 04/05/2019 12/01/2018  Falls in the past year? 0 0 0 0 0  Number falls in past yr: - - - - -  Injury with Fall? - - - - -  Follow up - - - - -     Objective:  Eric Stuart seemed alert and oriented and he participated appropriately during our telephone visit.  Blood Pressure Weight BMI  BP Readings from Last 3 Encounters:  11/04/19 137/83  10/21/19 (!) 167/99  10/21/19 (!) 171/86   Wt Readings from Last 3 Encounters:  11/04/19 118 lb 9.6 oz (53.8 kg)  10/21/19 123 lb 9.6 oz (56.1 kg)  10/21/19 123 lb 9.6 oz (56.1 kg)   BMI Readings from Last 1 Encounters:  11/04/19 23.16 kg/m    *Unable to obtain current vital signs, weight, and BMI due to telephone visit type  Hearing/Vision  . Azavier did not participate in telephone conversation. POA does state he is hard of hearing.  . Reports that he has not had a formal eye exam by an eye care  professional within the past year . Reports that he has not had a formal hearing evaluation within the past year *Unable to fully assess hearing and vision during telephone visit type  Cognitive Function: No flowsheet data found. (Normal:0-7, Significant for Dysfunction: >8)  Normal Cognitive Function Screening: N/A Winferd Humphrey did not participate.    Immunization & Health Maintenance Record Immunization History  Administered Date(s) Administered  . Influenza Inj Mdck Quad Pf 12/15/2017  . Influenza Split 02/26/2016, 01/02/2017  . Influenza,inj,Quad PF,6+ Mos 03/01/2013, 12/29/2013, 04/05/2019  . Influenza-Unspecified 12/15/2017  . Pneumococcal Conjugate-13 04/26/2014  . Pneumococcal Polysaccharide-23 02/13/2018  . Td 03/01/2013  . Tdap 03/01/2013  . Zoster 05/04/2012    Health Maintenance  Topic Date Due  . COVID-19 Vaccine (1) Never done  . OPHTHALMOLOGY EXAM  01/17/2019  . INFLUENZA VACCINE  10/31/2019  . COLONOSCOPY  04/04/2020  . HEMOGLOBIN A1C  04/22/2020  . FOOT EXAM  10/20/2020  . TETANUS/TDAP  03/02/2023  . PNEUMOCOCCAL POLYSACCHARIDE VACCINE AGE 33-64 HIGH RISK  Completed  . Hepatitis C Screening  Completed  . HIV Screening  Completed       Assessment  This is a routine wellness examination for Eric Stuart.  Health Maintenance: Due or Overdue Health Maintenance Due  Topic Date Due  . COVID-19 Vaccine (1) Never done  . OPHTHALMOLOGY EXAM  01/17/2019  . INFLUENZA VACCINE  10/31/2019    Eric Stuart does not need a referral for Community Assistance: Care Management:   no Social Work:    no Prescription Assistance:  no Nutrition/Diabetes Education:  no   Plan:  Personalized Goals Goals Addressed            This Visit's Progress   . Patient Stated       03/16/2020 AWV Goal: Keep All Scheduled Appointments  Over the next year, patient will attend all scheduled appointments with their PCP and any specialists that they see.        Personalized Health Maintenance & Screening Recommendations  Eye exam  Lung Cancer Screening Recommended: no (Low Dose CT Chest recommended if Age 67-80 years, 30 pack-year currently smoking OR have quit  w/in past 15 years) Hepatitis C Screening recommended: no HIV Screening recommended: no  Advanced Directives: Written information was not prepared per patient's request.  Referrals & Orders No orders of the defined types were placed in this encounter.   Follow-up Plan . Follow-up with [name], FNP as planned   I have personally reviewed and noted the following in the patient's chart:   . Medical and social history . Use of alcohol, tobacco or illicit drugs  . Current medications and supplements . Functional ability and status . Nutritional status . Physical activity . Advanced directives . List of other physicians . Hospitalizations, surgeries, and ER visits in previous 12 months . Vitals . Screenings to include cognitive, depression, and falls . Referrals and appointments  In addition, I have reviewed and discussed with Eric Stuart certain preventive protocols, quality metrics, and best practice recommendations. A written personalized care plan for preventive services as well as general preventive health recommendations is available and can be mailed to the patient at his request.      Baldomero Lamy, LPN  24/12/7351

## 2020-03-30 ENCOUNTER — Telehealth: Payer: Medicare Other | Admitting: *Deleted

## 2020-04-04 ENCOUNTER — Telehealth: Payer: Self-pay | Admitting: *Deleted

## 2020-04-04 NOTE — Telephone Encounter (Signed)
  Chronic Care Management   Outreach Note  03/30/20 Name: Eric Stuart MRN: [id number] DOB: 18-Jun-1955  Referred by: Junie Spencer, FNP Reason for referral : Chronic Care Management (RN follow up)   An unsuccessful follow-up Telephone Visit was attempted today. The patient was referred to the case management team for assistance with care management and care coordination.   Clinical Goals: . Over the next 30 days, patient will be contacted by a Care Guide to reschedule their CCM telephone visit  Interventions and Plan . Chart reviewed in preparation for call . Collaboration with other care team members as needed . Unsuccessful outreach to patient/sister  . A HIPAA compliant phone message was left for the patient providing contact information and requesting a return call.  . Request sent to care guides to reach out and reschedule patient's follow-up visit   Demetrios Loll, BSN, RN-BC Embedded Chronic Care Manager [facility] / [facility] Care Management Direct Dial: [phone]

## 2020-04-05 ENCOUNTER — Telehealth: Payer: Self-pay | Admitting: *Deleted

## 2020-04-05 NOTE — Chronic Care Management (AMB) (Signed)
  Care Management   Note  04/05/2020 Name: [name] MRN: [id number] DOB: 17-Jun-1955  Eric Stuart is a 65 y.o. year old male who is a primary care patient of Junie Spencer, FNP and is actively engaged with the care management team. I reached out to Henreitta Cea by phone today to assist with re-scheduling a follow up visit with the RN Case Manager  Follow up plan: Unsuccessful telephone outreach attempt made. A HIPAA compliant phone message was left for the patient providing contact information and requesting a return call. The care management team will reach out to the patient again over the next 7 days. If patient returns call to provider office, please advise to call Embedded Care Management Care Guide Gwenevere Ghazi at [phone].  Gwenevere Ghazi  Care Guide, Embedded Care Coordination [facility] Management

## 2020-04-05 NOTE — Telephone Encounter (Signed)
Please r/s

## 2020-04-11 ENCOUNTER — Other Ambulatory Visit: Payer: Self-pay | Admitting: Family

## 2020-04-11 NOTE — Chronic Care Management (AMB) (Signed)
  Care Management   Note  04/11/2020 Name: Eric Stuart MRN: [id number] DOB: 1955-06-01  [name] is a 65 y.o. year old male who is a primary care patient of [name], FNP and is actively engaged with the care management team. I reached out to [name] by phone today to assist with re-scheduling a follow up visit with the RN Case Manager  Follow up plan: Unsuccessful telephone outreach attempt made. A HIPAA compliant phone message was left for the patient providing contact information and requesting a return call. The care management team will reach out to the patient again over the next 7 days. If patient returns call to provider office, please advise to call [place] at [phone].  [place] Management

## 2020-04-17 NOTE — Chronic Care Management (AMB) (Signed)
  Care Management   Note  04/17/2020 Name: Vayden Weinand MRN: [id number] DOB: 1955/07/05  Harland Aguiniga is a 65 y.o. year old male who is a primary care patient of [name], FNP and is actively engaged with the care management team. I reached out to [name] by phone today to assist with re-scheduling a follow up visit with the RN Case Manager.  Follow up plan: Telephone appointment with care management team member scheduled for:04/28/2020  [place] Management

## 2020-04-17 NOTE — Telephone Encounter (Signed)
Spoke with sister [name] rescheduled call for 04/28/2020

## 2020-04-19 ENCOUNTER — Ambulatory Visit: Payer: Medicare Other | Admitting: Licensed Clinical Social Worker

## 2020-04-19 DIAGNOSIS — E1159 Type 2 diabetes mellitus with other circulatory complications: Secondary | ICD-10-CM

## 2020-04-19 DIAGNOSIS — I152 Hypertension secondary to endocrine disorders: Secondary | ICD-10-CM

## 2020-04-19 DIAGNOSIS — E1169 Type 2 diabetes mellitus with other specified complication: Secondary | ICD-10-CM

## 2020-04-19 DIAGNOSIS — E785 Hyperlipidemia, unspecified: Secondary | ICD-10-CM

## 2020-04-19 NOTE — Chronic Care Management (AMB) (Signed)
Chronic Care Management    Clinical Social Work Follow Up Note  04/19/2020 Name: Eric Stuart MRN: [id number] DOB: 11-22-1955  Eric Stuart is a 65 y.o. year old male who is a primary care patient of [name], FNP. The CCM team was consulted for assistance with [name] .   Review of patient status, including review of consultants reports, other relevant assessments, and collaboration with appropriate care team members and the patient's provider was performed as part of comprehensive patient evaluation and provision of chronic care management services.    SDOH (Social Determinants of Health) assessments performed: No; risk for tobacco use; risk for depression; risk for stress; risk for physical inactivity  Flowsheet Row Chronic Care Management from 09/20/2019 in [facility]  PHQ-9 Total Score 3      GAD 7 : Generalized Anxiety Score 09/20/2019  Nervous, Anxious, on Edge 1  Control/stop worrying 0  Worry too much - different things 0  Trouble relaxing 0  Restless 0  Easily annoyed or irritable 0  Afraid - awful might happen 0  Total GAD 7 Score 1  Anxiety Difficulty Somewhat difficult    Outpatient Encounter Medications as of 04/19/2020  Medication Sig Note  . amLODipine (NORVASC) 10 MG tablet Take 1 tablet (10 mg total) by mouth daily.   . carbamide peroxide (EAR WAX REMOVAL DROPS) 6.5 % OTIC solution Place 5 drops into both ears daily as needed (ear wax excessive).   . donepezil (ARICEPT) 10 MG tablet Take 10 mg by mouth at bedtime.   . metFORMIN (GLUCOPHAGE) 1000 MG tablet TAKE 1 TABLET 2 (TWO) TIMES DAILY WITH A MEAL. (NEEDS TO BE SEEN BEFORE NEXT REFILL) (Patient taking differently: Take 500 mg by mouth 2 (two) times daily with a meal.)   . olmesartan-hydrochlorothiazide (BENICAR HCT) 40-25 MG tablet Take 1 tablet by mouth daily. (Needs to be seen before next refill)   . rosuvastatin (CRESTOR) 10 MG tablet Take 1 tablet (10 mg total)  by mouth every evening. Needs to be seen for further refills. 10/21/2019: Has needed refill for past few weeks per spouse    No facility-administered encounter medications on file as of 04/19/2020.    Goals    .  Client will talk with [name] in next 30 days about clinet management of health needs faced (pt-stated)      CARE PLAN ENTRY   Current Barriers:  . Patient with Chronic Diagnoses of Sarcodosis, DM, HTN, HLD . Hearing difficulty  Clinical Social Work Clinical Goal(s):  Marland Kitchen [name] to call client in next 30 days to talk with client about client management of health needs faced  Interventions: . Talked with Eric Stuart, sister and caregiver for client about CCM program . Talked with Eric Stuart about mobility of client . Talked with Eric Stuart about ADLs completion of client . Talked with Eric Stuart about relaxation techniques of client (likes istening to music, likes to watch car racing, likes to visit with family and friends) . Talked with Eric Stuart about upcoming medical appointments of client . Encouraged Eric Stuart or client to talk with [facility] as needed related to nursing needs of client . Talked with Eric Stuart about social support network of client (sister, Eric Stuart is supportive) . Talked with Eric Stuart about appetite of client . Talked with Eric Stuart about client support with Dr. Merlene Laughter, neurologist . Talked with Eric Stuart about memory issues of client . Talked with Eric Stuart about vision needs of client . Talked with Eric Stuart about transport needs of client .  Talked with Eric Stuart about hearing needs of client . Collaborated with RNCM to discuss nursing needs of client   Patient Self Care Activities:   Completes ADLs independently Attends scheduled medical appointments   Patient Self Care Deficits:  . Hearing difficulty  Initial goal documentation     Follow Up Plan: [name] will call client/Eric Stuart, sister,in next 4 weeks to talk with client/sister of clientabout management of health needs of  client  Norva Riffle.Shrita Thien MSW, [name] Licensed Clinical Social Worker [facility] Care Management [phone]

## 2020-04-19 NOTE — Patient Instructions (Addendum)
Licensed Clinical Social Worker Visit Information  Goals we discussed today:   .  Client will talk with [name] in next 30 days about clinet management of health needs faced (pt-stated)      CARE PLAN ENTRY  Current Barriers:  Patient with Chronic Diagnoses of Sarcodosis, DM, HTN, HLD  Hearing difficulty Clinical Social Work Clinical Goal(s):  [name] to call client in next 30 days to talk with client about client management of health needs faced Interventions:  Talked with [name], sister and caregiver for client about CCM program  Talked with Eric Congo about mobility of client  Talked with Eric Congo about ADLs completion of client  Talked with Eric Congo about relaxation techniques of client (likes istening to music, likes to watch car racing, likes to visit with family and friends)  Talked with Eric Congo about upcoming medical appointments of client  Encouraged Eric Congo or client to talk with [facility] as needed related to nursing needs of client  Talked with Eric Congo about social support network of client (sister, Eric Congo is supportive)  Talked with Eric Congo about appetite of client  Talked with Eric Congo about client support with Dr. Merlene Laughter, neurologist  Talked with Eric Congo about memory issues of client  Talked with Eric Congo about vision needs of client  Talked with Eric Congo about transport needs of client  Talked with Eric Congo about hearing needs of client  Collaborated with RNCM to discuss nursing needs of client  Patient Self Care Activities:  Completes ADLs independently  Attends scheduled medical appointments  Patient Self Care Deficits:  Hearing difficulty Initial goal documentation   Follow Up Plan: [name] will call client Eric Stuart, sister, in next 4 weeks to talk with client/sister of client about management of health needs of client   Materials Provided: No  The patient/Eric Stuart,sister of patient, verbalized understanding of instructions provided today and declined a print copy of  patient instruction materials.   Eric Stuart.Eric Stuart MSW, [name] Licensed Clinical Social Worker [facility] Care Management [phone]

## 2020-04-28 ENCOUNTER — Telehealth: Payer: Medicare Other | Admitting: *Deleted

## 2020-05-06 ENCOUNTER — Other Ambulatory Visit: Payer: Self-pay | Admitting: Family

## 2020-05-08 NOTE — Telephone Encounter (Signed)
Left message for patient to call back to schedule appointment.

## 2020-05-08 NOTE — Telephone Encounter (Signed)
[place]. NTBS 30 days given 04/11/20 

## 2020-05-16 ENCOUNTER — Ambulatory Visit: Payer: Medicare Other | Admitting: Family

## 2020-05-22 ENCOUNTER — Telehealth: Payer: Medicare Other

## 2020-05-25 ENCOUNTER — Ambulatory Visit (INDEPENDENT_AMBULATORY_CARE_PROVIDER_SITE_OTHER): Payer: Medicare Other | Admitting: Licensed Clinical Social Worker

## 2020-05-25 DIAGNOSIS — E1159 Type 2 diabetes mellitus with other circulatory complications: Secondary | ICD-10-CM

## 2020-05-25 DIAGNOSIS — E1169 Type 2 diabetes mellitus with other specified complication: Secondary | ICD-10-CM

## 2020-05-25 DIAGNOSIS — E785 Hyperlipidemia, unspecified: Secondary | ICD-10-CM | POA: Diagnosis not present

## 2020-05-25 DIAGNOSIS — D869 Sarcoidosis, unspecified: Secondary | ICD-10-CM

## 2020-05-25 DIAGNOSIS — I152 Hypertension secondary to endocrine disorders: Secondary | ICD-10-CM | POA: Diagnosis not present

## 2020-05-25 NOTE — Patient Instructions (Addendum)
Licensed Clinical Social Worker Visit Information  Goals we discussed today:   .  Client will talk with [name] in next 30 days about clinet management of health needs faced (pt-stated)        CARE PLAN ENTRY   Current Barriers:   Patient with Chronic Diagnoses of Sarcodosis, DM, HTN, HLD  Hearing difficulty  Clinical Social Work Clinical Goal(s):   [name] to call client in next 30 days to talk with client about client management of health needs faced  Interventions:  Talked with [name], sister and caregiver for client about CCM program  Talked with [name] about memory issues of client  Talked with [name] about  mobility of client  Talked with [name] about ADLs completion of client  Talked with [name] about relaxation techniques of client (likes istening to music, likes to watch car racing, likes to visit with family and friends)  Talked with [name] about upcoming medical appointments of client  Encouraged [name] or client to talk with [facility] as needed related to nursing needs of client  Talked with [name] about social support network of client (sister, [name] is supportive)  Talked with [name] about client's management of Diabetes  Talked with [name] about client appointment with neurologist on July 03, 2020.  Talked with [name] about hearing issus of client  Talked with [name] about pain issues of client  Talked with [name] about mood of client  Talked with [name] about medication procurement for client  Talked with [name] about socialization of client   Patient Self Care Activities:   Completes ADLs independently Attends scheduled medical appointments   Patient Self Care Deficits:   Hearing difficulty  Initial goal documentation     Follow Up Plan:[name] will call client/Gloria McKiver, sister,in next 4 weeks to talk with client/sister of clientabout management of health needs of client  Materials Provided: No  The  patient/Gloria McKiver, sister of patient, verbalized understanding of instructions provided today and declined a print copy of patient instruction materials.   Norva Riffle.Matei Magnone MSW, [name] Licensed Clinical Social Worker [facility] Care Management [phone]

## 2020-05-25 NOTE — Chronic Care Management (AMB) (Signed)
Chronic Care Management    Clinical Social Work Follow Up Note  05/25/2020 Name: Eric Stuart MRN: [id number] DOB: 01/16/1956  Eric Stuart is a 65 y.o. year old male who is a primary care patient of [name], FNP. The CCM team was consulted for assistance with [name] .   Review of patient status, including review of consultants reports, other relevant assessments, and collaboration with appropriate care team members and the patient's provider was performed as part of comprehensive patient evaluation and provision of chronic care management services.    SDOH (Social Determinants of Health) assessments performed: No; risk for depression; risk for tobacco use; risk for stress; risk for physical inactivity;   Flowsheet Row Chronic Care Management from 09/20/2019 in [facility]  PHQ-9 Total Score 3      GAD 7 : Generalized Anxiety Score 09/20/2019  Nervous, Anxious, on Edge 1  Control/stop worrying 0  Worry too much - different things 0  Trouble relaxing 0  Restless 0  Easily annoyed or irritable 0  Afraid - awful might happen 0  Total GAD 7 Score 1  Anxiety Difficulty Somewhat difficult    Outpatient Encounter Medications as of 05/25/2020  Medication Sig Note  . amLODipine (NORVASC) 10 MG tablet Take 1 tablet (10 mg total) by mouth daily.   . carbamide peroxide (EAR WAX REMOVAL DROPS) 6.5 % OTIC solution Place 5 drops into both ears daily as needed (ear wax excessive).   . donepezil (ARICEPT) 10 MG tablet Take 10 mg by mouth at bedtime.   . metFORMIN (GLUCOPHAGE) 1000 MG tablet TAKE 1 TABLET 2 (TWO) TIMES DAILY WITH A MEAL. (NEEDS TO BE SEEN BEFORE NEXT REFILL) (Patient taking differently: Take 500 mg by mouth 2 (two) times daily with a meal.)   . olmesartan-hydrochlorothiazide (BENICAR HCT) 40-25 MG tablet Take 1 tablet by mouth daily. (Needs to be seen before next refill)   . rosuvastatin (CRESTOR) 10 MG tablet Take 1 tablet (10 mg  total) by mouth every evening. Needs to be seen for further refills. 10/21/2019: Has needed refill for past few weeks per spouse    No facility-administered encounter medications on file as of 05/25/2020.    Goals    .  Client will talk with [name] in next 30 days about clinet management of health needs faced (pt-stated)      CARE PLAN ENTRY   Current Barriers:  . Patient with Chronic Diagnoses of Sarcodosis, DM, HTN, HLD . Hearing difficulty  Clinical Social Work Clinical Goal(s):  Marland Kitchen [name] to call client in next 30 days to talk with client about client management of health needs faced  Interventions: . Talked with Eric Stuart, sister and caregiver for client about CCM program . Talked with Eric Stuart about memory issues of client . Talked with Eric Stuart about  mobility of client . Talked with Eric Stuart about ADLs completion of client . Talked with Eric Stuart about relaxation techniques of client (likes istening to music, likes to watch car racing, likes to visit with family and friends) . Talked with Eric Stuart about upcoming medical appointments of client . Encouraged Eric Stuart or client to talk with [facility] as needed related to nursing needs of client . Talked with Eric Stuart about social support network of client (sister, Eric Stuart is supportive) . Talked with Eric Stuart about client's management of Diabetes  Talked with Eric Stuart about client appointment with neurologist on July 03, 2020.  Talked with Eric Stuart about hearing issus of client  Talked with Eric Stuart about  pain issues of client  Talked with Eric Stuart about mood of client  Talked with Eric Stuart about medication procurement for client  Talked with Eric Stuart about socialization of client   Patient Self Care Activities:   Completes ADLs independently Attends scheduled medical appointments   Patient Self Care Deficits:  . Hearing difficulty  Initial goal documentation     Follow Up Plan:[name] will call client/Eric Stuart, sister,in next 4 weeks to  talk with client/sister of clientabout management of health needs of client  Norva Riffle.[place]rest MSW, [name] Licensed Clinical Social Worker [facility] Care Management [phone]

## 2020-06-01 ENCOUNTER — Ambulatory Visit (INDEPENDENT_AMBULATORY_CARE_PROVIDER_SITE_OTHER): Payer: Medicare Other | Admitting: Family

## 2020-06-01 ENCOUNTER — Other Ambulatory Visit: Payer: Self-pay

## 2020-06-01 ENCOUNTER — Encounter: Payer: Self-pay | Admitting: Family

## 2020-06-01 VITALS — BP 120/79 | HR 72 | Temp 97.6°F | Ht 60.0 in | Wt 117.0 lb

## 2020-06-01 DIAGNOSIS — Z23 Encounter for immunization: Secondary | ICD-10-CM | POA: Diagnosis not present

## 2020-06-01 DIAGNOSIS — D869 Sarcoidosis, unspecified: Secondary | ICD-10-CM

## 2020-06-01 DIAGNOSIS — E1159 Type 2 diabetes mellitus with other circulatory complications: Secondary | ICD-10-CM

## 2020-06-01 DIAGNOSIS — E1169 Type 2 diabetes mellitus with other specified complication: Secondary | ICD-10-CM | POA: Diagnosis not present

## 2020-06-01 DIAGNOSIS — Q860 Fetal alcohol syndrome (dysmorphic): Secondary | ICD-10-CM | POA: Diagnosis not present

## 2020-06-01 DIAGNOSIS — I152 Hypertension secondary to endocrine disorders: Secondary | ICD-10-CM | POA: Diagnosis not present

## 2020-06-01 DIAGNOSIS — E785 Hyperlipidemia, unspecified: Secondary | ICD-10-CM | POA: Diagnosis not present

## 2020-06-01 DIAGNOSIS — G309 Alzheimer's disease, unspecified: Secondary | ICD-10-CM | POA: Diagnosis not present

## 2020-06-01 DIAGNOSIS — F0280 Dementia in other diseases classified elsewhere without behavioral disturbance: Secondary | ICD-10-CM

## 2020-06-01 DIAGNOSIS — Z1211 Encounter for screening for malignant neoplasm of colon: Secondary | ICD-10-CM

## 2020-06-01 LAB — BAYER DCA HB A1C WAIVED: HB A1C (BAYER DCA - WAIVED): 6.4 % (ref <7.0)

## 2020-06-01 MED ORDER — ROSUVASTATIN CALCIUM 10 MG PO TABS: 10.0000 mg | ORAL_TABLET | Freq: Every evening | ORAL | 2 refills | Status: DC

## 2020-06-01 MED ORDER — OLMESARTAN MEDOXOMIL-HCTZ 40-25 MG PO TABS: 1.0000 | ORAL_TABLET | Freq: Every day | ORAL | 0 refills | Status: DC

## 2020-06-01 MED ORDER — DONEPEZIL HCL 10 MG PO TABS: 10.0000 mg | ORAL_TABLET | Freq: Every day | ORAL | 2 refills | Status: DC

## 2020-06-01 MED ORDER — METFORMIN HCL 1000 MG PO TABS: 500.0000 mg | ORAL_TABLET | Freq: Two times a day (BID) | ORAL | 1 refills | Status: DC

## 2020-06-01 MED ORDER — AMLODIPINE BESYLATE 10 MG PO TABS: 10.0000 mg | ORAL_TABLET | Freq: Every day | ORAL | 3 refills | Status: DC

## 2020-06-01 NOTE — Progress Notes (Signed)
Subjective:    Patient ID: Eric Stuart, male    DOB: 02-01-1956, 65 y.o.   MRN: [id number]  Chief Complaint  Patient presents with  . Diabetes  . Hypertension    Pt presents to the office today for chronic follow up. Pt has hx sarcoidosis. He is followed by Neurologists for Alzheimer's Disease. He was started on Aricept 10 mg. Sister states this has helped slightly.  Diabetes He presents for his follow-up diabetic visit. He has type 2 diabetes mellitus. There are no hypoglycemic associated symptoms. Pertinent negatives for diabetes include no blurred vision and no chest pain. Symptoms are stable. Pertinent negatives for diabetic complications include no CVA or nephropathy. Risk factors for coronary artery disease include dyslipidemia, diabetes mellitus, hypertension, male sex and sedentary lifestyle. He is following a generally healthy diet. (Does not check at home) An ACE inhibitor/angiotensin II receptor blocker is being taken.  Hypertension This is a chronic problem. The current episode started more than 1 year ago. The problem has been resolved since onset. The problem is controlled. Pertinent negatives include no blurred vision, chest pain, malaise/fatigue, peripheral edema or shortness of breath. Risk factors for coronary artery disease include dyslipidemia, diabetes mellitus and male gender. The current treatment provides moderate improvement. There is no history of CVA.  Hyperlipidemia This is a chronic problem. The current episode started more than 1 year ago. The problem is controlled. Recent lipid tests were reviewed and are normal. He has no history of obesity. Pertinent negatives include no chest pain or shortness of breath. Current antihyperlipidemic treatment includes statins. The current treatment provides moderate improvement of lipids. Risk factors for coronary artery disease include diabetes mellitus, hypertension and a sedentary lifestyle.      Review of Systems   Constitutional: Negative for malaise/fatigue.  Eyes: Negative for blurred vision.  Respiratory: Negative for shortness of breath.   Cardiovascular: Negative for chest pain.  All other systems reviewed and are negative.      Objective:   Physical Exam Vitals reviewed.  Constitutional:      General: He is not in acute distress.    Appearance: He is well-developed and well-nourished.  HENT:     Head: Normocephalic.     Right Ear: Tympanic membrane normal.     Left Ear: Tympanic membrane normal.     Mouth/Throat:     Mouth: Oropharynx is clear and moist.  Eyes:     General:        Right eye: No discharge.        Left eye: No discharge.     Pupils: Pupils are equal, round, and reactive to light.  Neck:     Thyroid: No thyromegaly.  Cardiovascular:     Rate and Rhythm: Normal rate and regular rhythm.     Pulses: Intact distal pulses.     Heart sounds: Normal heart sounds. No murmur heard.   Pulmonary:     Effort: Pulmonary effort is normal. No respiratory distress.     Breath sounds: Normal breath sounds. No wheezing.  Abdominal:     General: Bowel sounds are normal. There is no distension.     Palpations: Abdomen is soft.     Tenderness: There is no abdominal tenderness.  Musculoskeletal:        General: No tenderness or edema. Normal range of motion.     Cervical back: Normal range of motion and neck supple.  Skin:    General: Skin is warm and dry.  Findings: No erythema or rash.  Neurological:     Mental Status: He is alert and oriented to person, place, and time.     Cranial Nerves: No cranial nerve deficit.     Deep Tendon Reflexes: Reflexes are normal and symmetric.  Psychiatric:        Mood and Affect: Mood and affect normal.        Behavior: Behavior normal.        Thought Content: Thought content normal.        Judgment: Judgment normal.       BP 120/79   Pulse 72   Temp 97.6 F (36.4 C) (Temporal)   Ht 5' (1.524 m)   Wt 117 lb (53.1 kg)   BMI  22.85 kg/m      Assessment & Plan:  Angello Chien comes in today with chief complaint of Diabetes and Hypertension   Diagnosis and orders addressed:  1. Hyperlipidemia due to type 2 diabetes mellitus (HCC) - rosuvastatin (CRESTOR) 10 MG tablet; Take 1 tablet (10 mg total) by mouth every evening. Needs to be seen for further refills.  Dispense: 90 tablet; Refill: 2 - CMP14+EGFR - CBC with Differential/Platelet  2. Type 2 diabetes mellitus with other specified complication, without long-term current use of insulin (HCC) - metFORMIN (GLUCOPHAGE) 1000 MG tablet; Take 0.5 tablets (500 mg total) by mouth 2 (two) times daily with a meal.  Dispense: 180 tablet; Refill: 1 - Bayer DCA Hb A1c Waived - CMP14+EGFR - CBC with Differential/Platelet  3. Need for immunization against influenza - Flu Vaccine QUAD 36+ mos IM - CMP14+EGFR - CBC with Differential/Platelet  4. Hypertension associated with diabetes ([place]) - olmesartan-hydrochlorothiazide (BENICAR HCT) 40-25 MG tablet; Take 1 tablet by mouth daily. (Needs to be seen before next refill)  Dispense: 30 tablet; Refill: 0 - amLODipine (NORVASC) 10 MG tablet; Take 1 tablet (10 mg total) by mouth daily.  Dispense: 90 tablet; Refill: 3 - CMP14+EGFR - CBC with Differential/Platelet  5. Alzheimer's disease ([place]) - donepezil (ARICEPT) 10 MG tablet; Take 1 tablet (10 mg total) by mouth at bedtime.  Dispense: 90 tablet; Refill: 2 - CMP14+EGFR - CBC with Differential/Platelet  6. Fetal alcohol syndrome - CMP14+EGFR - CBC with Differential/Platelet  7. Sarcoidosis - CMP14+EGFR - CBC with Differential/Platelet  8. Screen for colon cancer - Ambulatory referral to Gastroenterology   Labs pending Health Maintenance reviewed Diet and exercise encouraged  Follow up plan: 6 months    [name], FNP

## 2020-06-01 NOTE — Patient Instructions (Signed)

## 2020-06-02 ENCOUNTER — Other Ambulatory Visit: Payer: Self-pay | Admitting: *Deleted

## 2020-06-02 DIAGNOSIS — R7989 Other specified abnormal findings of blood chemistry: Secondary | ICD-10-CM

## 2020-06-02 LAB — CMP14+EGFR
ALT: 15 IU/L (ref 0–44)
AST: 23 IU/L (ref 0–40)
Albumin/Globulin Ratio: 1.4 (ref 1.2–2.2)
Albumin: 4.6 g/dL (ref 3.8–4.8)
Alkaline Phosphatase: 63 IU/L (ref 44–121)
BUN/Creatinine Ratio: 21 (ref 10–24)
BUN: 30 mg/dL — ABNORMAL HIGH (ref 8–27)
Bilirubin Total: 0.4 mg/dL (ref 0.0–1.2)
CO2: 21 mmol/L (ref 20–29)
Calcium: 9.9 mg/dL (ref 8.6–10.2)
Chloride: 107 mmol/L — ABNORMAL HIGH (ref 96–106)
Creatinine, Ser: 1.44 mg/dL — ABNORMAL HIGH (ref 0.76–1.27)
Globulin, Total: 3.3 g/dL (ref 1.5–4.5)
Glucose: 98 mg/dL (ref 65–99)
Potassium: 5 mmol/L (ref 3.5–5.2)
Sodium: 144 mmol/L (ref 134–144)
Total Protein: 7.9 g/dL (ref 6.0–8.5)
eGFR: 55 mL/min/{1.73_m2} — ABNORMAL LOW (ref >59)

## 2020-06-02 LAB — CBC WITH DIFFERENTIAL/PLATELET
Basophils Absolute: 0 10*3/uL (ref 0.0–0.2)
Basos: 1 %
EOS (ABSOLUTE): 0.2 10*3/uL (ref 0.0–0.4)
Eos: 3 %
Hematocrit: 38.5 % (ref 37.5–51.0)
Hemoglobin: 12.9 g/dL — ABNORMAL LOW (ref 13.0–17.7)
Immature Grans (Abs): 0 10*3/uL (ref 0.0–0.1)
Immature Granulocytes: 0 %
Lymphocytes Absolute: 1.8 10*3/uL (ref 0.7–3.1)
Lymphs: 28 %
MCH: 27.8 pg (ref 26.6–33.0)
MCHC: 33.5 g/dL (ref 31.5–35.7)
MCV: 83 fL (ref 79–97)
Monocytes Absolute: 0.9 10*3/uL (ref 0.1–0.9)
Monocytes: 14 %
Neutrophils Absolute: 3.5 10*3/uL (ref 1.4–7.0)
Neutrophils: 54 %
Platelets: 228 10*3/uL (ref 150–450)
RBC: 4.64 x10E6/uL (ref 4.14–5.80)
RDW: 15.5 % — ABNORMAL HIGH (ref 11.6–15.4)
WBC: 6.5 10*3/uL (ref 3.4–10.8)

## 2020-06-24 ENCOUNTER — Other Ambulatory Visit: Payer: Self-pay | Admitting: Family

## 2020-06-24 DIAGNOSIS — E1159 Type 2 diabetes mellitus with other circulatory complications: Secondary | ICD-10-CM

## 2020-06-26 ENCOUNTER — Telehealth: Payer: Medicare Other

## 2020-07-03 DIAGNOSIS — Q860 Fetal alcohol syndrome (dysmorphic): Secondary | ICD-10-CM | POA: Diagnosis not present

## 2020-07-03 DIAGNOSIS — G309 Alzheimer's disease, unspecified: Secondary | ICD-10-CM | POA: Diagnosis not present

## 2020-07-03 DIAGNOSIS — I10 Essential (primary) hypertension: Secondary | ICD-10-CM | POA: Diagnosis not present

## 2020-07-03 DIAGNOSIS — E119 Type 2 diabetes mellitus without complications: Secondary | ICD-10-CM | POA: Diagnosis not present

## 2020-07-06 ENCOUNTER — Telehealth: Payer: Self-pay

## 2020-07-06 ENCOUNTER — Encounter: Payer: Self-pay | Admitting: Gastroenterology

## 2020-07-20 ENCOUNTER — Ambulatory Visit (INDEPENDENT_AMBULATORY_CARE_PROVIDER_SITE_OTHER): Payer: Medicare Other | Admitting: Licensed Clinical Social Worker

## 2020-07-20 DIAGNOSIS — E1169 Type 2 diabetes mellitus with other specified complication: Secondary | ICD-10-CM

## 2020-07-20 DIAGNOSIS — E1159 Type 2 diabetes mellitus with other circulatory complications: Secondary | ICD-10-CM

## 2020-07-20 DIAGNOSIS — I152 Hypertension secondary to endocrine disorders: Secondary | ICD-10-CM

## 2020-07-20 DIAGNOSIS — Z550 Illiteracy and low-level literacy: Secondary | ICD-10-CM

## 2020-07-20 DIAGNOSIS — E785 Hyperlipidemia, unspecified: Secondary | ICD-10-CM | POA: Diagnosis not present

## 2020-07-20 DIAGNOSIS — R413 Other amnesia: Secondary | ICD-10-CM

## 2020-07-20 DIAGNOSIS — D869 Sarcoidosis, unspecified: Secondary | ICD-10-CM

## 2020-07-20 NOTE — Chronic Care Management (AMB) (Signed)
Chronic Care Management    Clinical Social Work Note  07/20/2020 Name: Eric Stuart MRN: [id number] DOB: 1956/01/28  Eric Stuart is a 65 y.o. year old male who is a primary care patient of Eric Balloon, FNP. The CCM team was consulted to assist the patient with chronic disease management and/or care coordination needs related to: Eric Stuart .   Engaged with Eric Stuart, sister of client, by telephone for follow up visit in response to provider referral for social work chronic care management and care coordination services.   Consent to Services:  The patient was given information about Chronic Care Management services, agreed to services, and gave verbal consent prior to initiation of services.  Please see initial visit note for detailed documentation.   Patient agreed to services and consent obtained.   Assessment: Review of patient past medical history, allergies, medications, and health status, including review of relevant consultants reports was performed today as part of a comprehensive evaluation and provision of chronic care management and care coordination services.     SDOH (Social Determinants of Health) assessments and interventions performed:  SDOH Interventions   Flowsheet Row Most Recent Value  SDOH Interventions   Depression Interventions/Treatment  Medication       Advanced Directives Status: See Vynca application for related entries.  CCM Care Plan  No Known Allergies  Outpatient Encounter Medications as of 07/20/2020  Medication Sig  . amLODipine (NORVASC) 10 MG tablet Take 1 tablet (10 mg total) by mouth daily.  Marland Kitchen donepezil (ARICEPT) 10 MG tablet Take 1 tablet (10 mg total) by mouth at bedtime.  . metFORMIN (GLUCOPHAGE) 1000 MG tablet Take 0.5 tablets (500 mg total) by mouth 2 (two) times daily with a meal.  . olmesartan-hydrochlorothiazide (BENICAR HCT) 40-25 MG tablet Take 1 tablet by mouth daily.  . rosuvastatin (CRESTOR) 10 MG  tablet Take 1 tablet (10 mg total) by mouth every evening. Needs to be seen for further refills.   No facility-administered encounter medications on file as of 07/20/2020.    Patient Active Problem List   Diagnosis Date Noted  . Alzheimer's disease ([place]) 01/18/2020  . Fetal alcohol syndrome 11/22/2019  . Illiterate 11/04/2019  . Mild mental slowing 11/04/2019  . Hyperlipidemia due to type 2 diabetes mellitus ([place]) 04/26/2014  . SARCOIDOSIS 01/31/2009  . Diabetes mellitus ([place]) 01/31/2009  . Hypertension associated with diabetes ([place]) 01/31/2009    Conditions to be addressed/monitored: Monitor client completion of ADLs   Care Plan : Eric Stuart care plan  Updates made by Eric Cabal, Eric Stuart since 07/20/2020 12:00 AM    Problem: Coping Skills (General Plan of Care)     Goal: Coping Skills Enhanced;Manage Pain issues; Complete daily ADLs as able   Start Date: 07/20/2020  Expected End Date: 10/19/2020  This Visit's Progress: On track  Priority: Medium  Note:   Current barriers:   . Patient in need of assistance with connecting to community resources for possible help with client management of current health needs and daily activities . Patient is unable to independently navigate community resource options without care coordination support . Pain issues . Mobility issues  Clinical Goals:  patient will work with SW monthly to address concerns related to client completion of ADLs and daily activities  Patient will communicate with Eric Stuart as needed in next 30 days for CCM support Patient will talk with Eric Stuart in next 30 days to discuss mobility challenges of client  Clinical Interventions:  . Collaboration with Eric Balloon,  FNP regarding development and update of comprehensive plan of care as evidenced by provider attestation and co-signature . Assessment of needs, barriers of client . Talked with Eric Stuart, sister of client, about client needs . Talked with Eric Stuart about client  appointment with Eric Stuart in September of 2022 . Talked with Eric Stuart about appetite of client . Talked with Eric Stuart about sleeping issues of client . Talked with Eric Stuart about ambulation of client . Talked with Eric Stuart about memory challenges of client . Talked with Eric Stuart about family support for client Eric Stuart is supportive) . Talked with Eric Stuart about food procurement for client . Talked with Eric Stuart about transport needs of client . Talked with Eric Stuart about mood of client . Talked with Eric Stuart about ADLs completion of client . Talked with Eric Stuart about upcoming client medical appointments . Talked with Eric Stuart about social activities of client  Patient Coping Skills:  Has support from sister, Eric Stuart Completes ADLs as able Attend scheduled medical appointments  Patient Deficits  Alzheimer's Disease Some mobility challenges  Patient Goals:   Patient will attend scheduled medical appointments in next 30 days Patient will call Eric Stuart or Eric Stuart as needed in next 30 days for CCM support Patient will communicate regularly with his sister to discuss client needs within next 30 days -  Follow Up Plan:  Eric Stuart to call client on 08/29/20 to assess client needs    Eric Stuart MSW, Eric Stuart Licensed Clinical Social Worker Eric Stuart Care Management [phone]

## 2020-07-20 NOTE — Patient Instructions (Signed)
Visit Information  PATIENT GOALS: Goals Addressed            This Visit's Progress   . Find Help in My Community       Timeframe:  Short-Term Goal Priority:  Medium Progress: On Track Start Date:             07/20/20                Expected End Date:           10/19/20            Follow Up Date 08/29/20   Find Help in My Community (Patient)      Manage Pain issues; Complete ADLs daily as able    Why is this important?    Knowing how and where to find help for yourself or family in your neighborhood and community is an important skill.   You will want to take some steps to learn how.   Patient Coping Skills:  Has support from sister, [name] Completes ADLs as able Attend scheduled medical appointments  Patient Deficits  Alzheimer's Disease Some mobility challenges  Patient Goals:   Patient will attend scheduled medical appointments in next 30 days Patient will call [name] or RNCM as needed in next 30 days for CCM support Patient will communicate regularly with his sister to discuss client needs within next 30 days -  Follow Up Plan:  [name] to call client on 08/29/20 to assess client needs       Norva Riffle.Kalesha Irving MSW, [name] Licensed Clinical Social Worker [facility] Care Management [phone]

## 2020-08-09 ENCOUNTER — Encounter: Payer: Self-pay | Admitting: Family Medicine

## 2020-08-29 ENCOUNTER — Ambulatory Visit (INDEPENDENT_AMBULATORY_CARE_PROVIDER_SITE_OTHER): Payer: Medicare Other | Admitting: Licensed Clinical Social Worker

## 2020-08-29 DIAGNOSIS — E1169 Type 2 diabetes mellitus with other specified complication: Secondary | ICD-10-CM | POA: Diagnosis not present

## 2020-08-29 DIAGNOSIS — D869 Sarcoidosis, unspecified: Secondary | ICD-10-CM

## 2020-08-29 DIAGNOSIS — I152 Hypertension secondary to endocrine disorders: Secondary | ICD-10-CM | POA: Diagnosis not present

## 2020-08-29 DIAGNOSIS — E1159 Type 2 diabetes mellitus with other circulatory complications: Secondary | ICD-10-CM | POA: Diagnosis not present

## 2020-08-29 DIAGNOSIS — E785 Hyperlipidemia, unspecified: Secondary | ICD-10-CM | POA: Diagnosis not present

## 2020-08-29 DIAGNOSIS — Z550 Illiteracy and low-level literacy: Secondary | ICD-10-CM

## 2020-08-29 DIAGNOSIS — R413 Other amnesia: Secondary | ICD-10-CM

## 2020-08-29 NOTE — Patient Instructions (Signed)
Visit Information  PATIENT GOALS: Goals Addressed            This Visit's Progress   . Find Help in My Community;Complete ADLs daily as able       Timeframe:  Short-Term Goal Priority:  Medium Progress: On Track Start Date:             07/20/20                Expected End Date:           10/19/20            Follow Up Date 10/11/20   Find Help in My Community (Patient)      Manage Pain issues; Complete ADLs daily as able    Why is this important?    Knowing how and where to find help for yourself or family in your neighborhood and community is an important skill.   You will want to take some steps to learn how.   Patient Coping Skills:  Has support from sister, [name] Completes ADLs as able Attend scheduled medical appointments  Patient Deficits  Alzheimer's Disease Some mobility challenges  Patient Goals:   Patient will attend scheduled medical appointments in next 30 days Patient will call [name] or RNCM as needed in next 30 days for CCM support Patient will communicate regularly with his sister to discuss client needs within next 30 days -  Follow Up Plan:  [name] to call client on 10/11/20 to assess client needs        Norva Riffle.Eric Stuart MSW, [name] Licensed Clinical Social Worker [facility] Care Management [phone]

## 2020-08-29 NOTE — Chronic Care Management (AMB) (Signed)
Chronic Care Management    Clinical Social Work Note  08/29/2020 Name: Eric Stuart MRN: [id number] DOB: 11/30/1955  Eric Stuart is a 65 y.o. year old male who is a primary care patient of [name], FNP. The CCM team was consulted to assist the patient with chronic disease management and/or care coordination needs related to: [name] .   Engaged with patient Eric Stuart of patient, Eric Stuart,  by telephone for follow up visit in response to provider referral for social work chronic care management and care coordination services.   Consent to Services:  The patient was given information about Chronic Care Management services, agreed to services, and gave verbal consent prior to initiation of services.  Please see initial visit note for detailed documentation.   Patient agreed to services and consent obtained.   Assessment: Review of patient past medical history, allergies, medications, and health status, including review of relevant consultants reports was performed today as part of a comprehensive evaluation and provision of chronic care management and care coordination services.     SDOH (Social Determinants of Health) assessments and interventions performed:  SDOH Interventions   Flowsheet Row Most Recent Value  SDOH Interventions   Depression Interventions/Treatment  Medication       Advanced Directives Status: See Vynca application for related entries.  CCM Care Plan  No Known Allergies  Outpatient Encounter Medications as of 08/29/2020  Medication Sig  . amLODipine (NORVASC) 10 MG tablet Take 1 tablet (10 mg total) by mouth daily.  Marland Kitchen donepezil (ARICEPT) 10 MG tablet Take 1 tablet (10 mg total) by mouth at bedtime.  . metFORMIN (GLUCOPHAGE) 1000 MG tablet Take 0.5 tablets (500 mg total) by mouth 2 (two) times daily with a meal.  . olmesartan-hydrochlorothiazide (BENICAR HCT) 40-25 MG tablet Take 1 tablet by mouth daily.  . rosuvastatin (CRESTOR) 10 MG  tablet Take 1 tablet (10 mg total) by mouth every evening. Needs to be seen for further refills.   No facility-administered encounter medications on file as of 08/29/2020.    Patient Active Problem List   Diagnosis Date Noted  . Alzheimer's disease ([place]) 01/18/2020  . Fetal alcohol syndrome 11/22/2019  . Illiterate 11/04/2019  . Mild mental slowing 11/04/2019  . Hyperlipidemia due to type 2 diabetes mellitus ([place]) 04/26/2014  . SARCOIDOSIS 01/31/2009  . Diabetes mellitus ([place]) 01/31/2009  . Hypertension associated with diabetes ([place]) 01/31/2009    Conditions to be addressed/monitored: Monitor client completion of daily ADLs, as able   Care Plan : [name] care plan  Updates made by [name], [name] since 08/29/2020 12:00 AM    Problem: Coping Skills (General Plan of Care)     Goal: Coping Skills Enhanced;Manage Pain issues; Complete daily ADLs as able   Start Date: 07/20/2020  Expected End Date: 10/19/2020  This Visit's Progress: On track  Recent Progress: On track  Priority: Medium  Note:   Current barriers:   . Patient in need of assistance with connecting to community resources for possible help with client management of current health needs and daily activities . Patient is unable to independently navigate community resource options without care coordination support . Pain issues . Mobility issues  Clinical Goals:  patient will work with SW monthly to address concerns related to client completion of ADLs and daily activities  Patient will communicate with RNCM as needed in next 30 days for CCM support Patient will talk with [name] in next 30 days to discuss mobility challenges of client  Clinical Interventions:  . Collaboration with [name], FNP regarding development and update of comprehensive plan of care as evidenced by provider attestation and co-signature . Assessment of needs, barriers of client . Talked with Eric Stuart, sister of client, about  client needs . Talked with Eric Stuart about client appointment with Dr. Merlene Laughter in September of 2022 . Talked with Eric Stuart about appetite of client . Talked with Eric Stuart about medication procurement of client . Talked with Eric Stuart about sleeping issues of client . Talked with Eric Stuart about ambulation of client   . Talked with Eric Stuart about client appointment for colonoscopy on September 13, 2020 . Talked with Eric Stuart about memory challenges of client . Talked with Eric Stuart about family support for client Eric Stuart is supportive) . Talked with Eric Stuart about transport needs of client . Talked with Eric Stuart about mood of client . Talked with Eric Stuart about ADLs completion of client . Talked with Eric Stuart about upcoming client medical appointments . Encouraged Eric Stuart or client to call RNCM as needed for nursing support for client  Patient Coping Skills:  Has support from sister, Eric Stuart Completes ADLs as able Attend scheduled medical appointments  Patient Deficits  Alzheimer's Disease Some mobility challenges  Patient Goals:   Patient will attend scheduled medical appointments in next 30 days Patient will call [name] or RNCM as needed in next 30 days for CCM support Patient will communicate regularly with his sister to discuss client needs within next 30 days -  Follow Up Plan:  [name] to call client on 10/11/20 to assess client needs    Norva Riffle.Sarthak Rubenstein MSW, [name] Licensed Clinical Social Worker [facility] Care Management [phone]

## 2020-08-31 ENCOUNTER — Ambulatory Visit (AMBULATORY_SURGERY_CENTER): Payer: Medicare Other

## 2020-08-31 VITALS — Ht 63.0 in | Wt 118.0 lb

## 2020-08-31 DIAGNOSIS — Z86010 Personal history of colonic polyps: Secondary | ICD-10-CM

## 2020-08-31 MED ORDER — NA SULFATE-K SULFATE-MG SULF 17.5-3.13-1.6 GM/177ML PO SOLN: 1.0000 | Freq: Once | ORAL | 0 refills | Status: AC

## 2020-08-31 NOTE — Progress Notes (Signed)
No egg or soy allergy known to patient  No issues with past sedation with any surgeries or procedures Patient denies ever being told they had issues or difficulty with intubation  No FH of Malignant Hyperthermia No diet pills per patient No home 02 use per patient  No blood thinners per patient  Pt denies issues with constipation  No A fib or A flutter  EMMI video to pt or via [place] 19 guidelines implemented in [place] today with Pt and RN  Pt is fully vaccinated  for Covid   VIRTUAL PREVISIT  Due to the COVID-19 pandemic we are asking patients to follow certain guidelines.  Pt aware of COVID protocols and [facility] guidelines

## 2020-09-13 ENCOUNTER — Other Ambulatory Visit: Payer: Self-pay

## 2020-09-13 ENCOUNTER — Ambulatory Visit (AMBULATORY_SURGERY_CENTER): Payer: Medicare Other | Admitting: Gastroenterology

## 2020-09-13 ENCOUNTER — Encounter: Payer: Self-pay | Admitting: Gastroenterology

## 2020-09-13 VITALS — BP 97/61 | HR 55 | Temp 96.9°F | Resp 14 | Ht 63.0 in | Wt 118.0 lb

## 2020-09-13 DIAGNOSIS — I10 Essential (primary) hypertension: Secondary | ICD-10-CM | POA: Diagnosis not present

## 2020-09-13 DIAGNOSIS — E119 Type 2 diabetes mellitus without complications: Secondary | ICD-10-CM | POA: Diagnosis not present

## 2020-09-13 DIAGNOSIS — D128 Benign neoplasm of rectum: Secondary | ICD-10-CM

## 2020-09-13 DIAGNOSIS — Z86010 Personal history of colonic polyps: Secondary | ICD-10-CM

## 2020-09-13 DIAGNOSIS — F0390 Unspecified dementia without behavioral disturbance: Secondary | ICD-10-CM | POA: Diagnosis not present

## 2020-09-13 DIAGNOSIS — E785 Hyperlipidemia, unspecified: Secondary | ICD-10-CM | POA: Diagnosis not present

## 2020-09-13 NOTE — Progress Notes (Signed)
Report to PACU, RN, vss, BBS= Clear.  

## 2020-09-13 NOTE — Progress Notes (Signed)
S.B. vital signs.

## 2020-09-13 NOTE — Patient Instructions (Signed)
YOU HAD AN ENDOSCOPIC PROCEDURE TODAY AT THE [facility]:   Refer to the procedure report that was given to you for any specific questions about what was found during the examination.  If the procedure report does not answer your questions, please call your gastroenterologist to clarify.  If you requested that your care partner not be given the details of your procedure findings, then the procedure report has been included in a sealed envelope for you to review at your convenience later.  YOU SHOULD EXPECT: Some feelings of bloating in the abdomen. Passage of more gas than usual.  Walking can help get rid of the air that was put into your GI tract during the procedure and reduce the bloating. If you had a lower endoscopy (such as a colonoscopy or flexible sigmoidoscopy) you may notice spotting of blood in your stool or on the toilet paper. If you underwent a bowel prep for your procedure, you may not have a normal bowel movement for a few days.  Please Note:  You might notice some irritation and congestion in your nose or some drainage.  This is from the oxygen used during your procedure.  There is no need for concern and it should clear up in a day or so.  SYMPTOMS TO REPORT IMMEDIATELY:   Following lower endoscopy (colonoscopy or flexible sigmoidoscopy):  Excessive amounts of blood in the stool  Significant tenderness or worsening of abdominal pains  Swelling of the abdomen that is new, acute  Fever of 100F or higher   Following upper endoscopy (EGD)  Vomiting of blood or coffee ground material  New chest pain or pain under the shoulder blades  Painful or persistently difficult swallowing  New shortness of breath  Fever of 100F or higher  Black, tarry-looking stools  For urgent or emergent issues, a gastroenterologist can be reached at any hour by calling [phone]. Do not use MyChart messaging for urgent concerns.    DIET:  We do recommend a small meal at first, but  then you may proceed to your regular diet.  Drink plenty of fluids but you should avoid alcoholic beverages for 24 hours.  ACTIVITY:  You should plan to take it easy for the rest of today and you should NOT DRIVE or use heavy machinery until tomorrow (because of the sedation medicines used during the test).    FOLLOW UP: Our staff will call the number listed on your records 48-72 hours following your procedure to check on you and address any questions or concerns that you may have regarding the information given to you following your procedure. If we do not reach you, we will leave a message.  We will attempt to reach you two times.  During this call, we will ask if you have developed any symptoms of COVID 19. If you develop any symptoms (ie: fever, flu-like symptoms, shortness of breath, cough etc.) before then, please call [phone].  If you test positive for Covid 19 in the 2 weeks post procedure, please call and report this information to us.    If any biopsies were taken you will be contacted by phone or by letter within the next 1-3 weeks.  Please call us at [phone] if you have not heard about the biopsies in 3 weeks.    SIGNATURES/CONFIDENTIALITY: You and/or your care partner have signed paperwork which will be entered into your electronic medical record.  These signatures attest to the fact that that the information above on   your After Visit Summary has been reviewed and is understood.  Full responsibility of the confidentiality of this discharge information lies with you and/or your care-partner. 

## 2020-09-13 NOTE — Op Note (Signed)
[place] Patient Name: Eric Stuart Procedure Date: 09/13/2020 8:49 AM MRN: [id number] Endoscopist: Mallie Mussel L. Loletha Carrow , MD Age: 65 Referring MD:  Date of Birth: 1955/10/08 Gender: Male Account #: [id number] Procedure:                Colonoscopy Indications:              Surveillance: Personal history of adenomatous                            polyps on last colonoscopy > 5 years ago                           36mm sigmoid TA Jan 2012 Medicines:                Monitored Anesthesia Care Procedure:                Pre-Anesthesia Assessment:                           - Prior to the procedure, a History and Physical                            was performed, and patient medications and                            allergies were reviewed. The patient's tolerance of                            previous anesthesia was also reviewed. The risks                            and benefits of the procedure and the sedation                            options and risks were discussed with the patient.                            All questions were answered, and informed consent                            was obtained. Prior Anticoagulants: The patient has                            taken no previous anticoagulant or antiplatelet                            agents. ASA Grade Assessment: III - A patient with                            severe systemic disease. After reviewing the risks                            and benefits, the patient was deemed in  satisfactory condition to undergo the procedure.                           After obtaining informed consent, the colonoscope                            was passed under direct vision. Throughout the                            procedure, the patient's blood pressure, pulse, and                            oxygen saturations were monitored continuously. The                            Olympus CF-[id number]) Colonoscope was                             introduced through the anus and advanced to the the                            cecum, identified by appendiceal orifice and                            ileocecal valve. The colonoscopy was performed                            without difficulty. The patient tolerated the                            procedure well. The quality of the bowel                            preparation was good. The ileocecal valve,                            appendiceal orifice, and rectum were photographed.                            The bowel preparation used was SUPREP. Scope In: 9:02:06 AM Scope Out: 9:16:24 AM Scope Withdrawal Time: 0 hours 11 minutes 1 second  Total Procedure Duration: 0 hours 14 minutes 18 seconds  Findings:                 The perianal and digital rectal examinations were                            normal.                           A diminutive polyp was found in the rectum. The                            polyp was sessile. The polyp was removed with a  cold biopsy forceps. Resection and retrieval were                            complete.                           Internal hemorrhoids were found.                           The exam was otherwise without abnormality on                            direct and retroflexion views. Complications:            No immediate complications. Estimated Blood Loss:     Estimated blood loss was minimal. Impression:               - One diminutive polyp in the rectum, removed with                            a cold biopsy forceps. Resected and retrieved.                           - Internal hemorrhoids.                           - The examination was otherwise normal on direct                            and retroflexion views. Recommendation:           - Patient has a contact number available for                            emergencies. The signs and symptoms of potential                            delayed  complications were discussed with the                            patient. Return to normal activities tomorrow.                            Written discharge instructions were provided to the                            patient.                           - Resume previous diet.                           - Continue present medications.                           - Await pathology results.                           -  Repeat colonoscopy is recommended for                            surveillance. The colonoscopy date will be                            determined after pathology results from today's                            exam become available for review. India Jolin L. Loletha Carrow, MD 09/13/2020 9:19:17 AM This report has been signed electronically.

## 2020-09-13 NOTE — Progress Notes (Signed)
Called to room to assist during endoscopic procedure.  Patient ID and intended procedure confirmed with present staff. Received instructions for my participation in the procedure from the performing physician.  

## 2020-09-13 NOTE — Progress Notes (Signed)
Pt's states no medical or surgical changes since previsit or office visit. 

## 2020-09-15 ENCOUNTER — Telehealth: Payer: Self-pay | Admitting: *Deleted

## 2020-09-15 NOTE — Telephone Encounter (Signed)
Message left

## 2020-09-15 NOTE — Telephone Encounter (Signed)
  Follow up Call-  Call back number 09/13/2020  Post procedure Call Back phone  # Fayne Mediate [phone]  Permission to leave phone message Yes  Some recent data might be hidden     Patient questions:  Do you have a fever, pain , or abdominal swelling? No. Pain Score  0 *  Have you tolerated food without any problems? Yes.    Have you been able to return to your normal activities? Yes.    Do you have any questions about your discharge instructions: Diet   No. Medications  No. Follow up visit  No.  Do you have questions or concerns about your Care? No.  Actions: * If pain score is 4 or above: No action needed, pain <4.Have you developed a fever since your procedure? no  2.   Have you had an respiratory symptoms (SOB or cough) since your procedure? no  3.   Have you tested positive for COVID 19 since your procedure no  4.   Have you had any family members/close contacts diagnosed with the COVID 19 since your procedure?  no   If yes to any of these questions please route to Joylene John, RN and Joella Prince, RN

## 2020-09-20 ENCOUNTER — Encounter: Payer: Self-pay | Admitting: Gastroenterology

## 2020-10-11 ENCOUNTER — Ambulatory Visit (INDEPENDENT_AMBULATORY_CARE_PROVIDER_SITE_OTHER): Payer: Medicare Other | Admitting: Licensed Clinical Social Worker

## 2020-10-11 DIAGNOSIS — E785 Hyperlipidemia, unspecified: Secondary | ICD-10-CM

## 2020-10-11 DIAGNOSIS — E1169 Type 2 diabetes mellitus with other specified complication: Secondary | ICD-10-CM

## 2020-10-11 DIAGNOSIS — E1159 Type 2 diabetes mellitus with other circulatory complications: Secondary | ICD-10-CM

## 2020-10-11 DIAGNOSIS — Z550 Illiteracy and low-level literacy: Secondary | ICD-10-CM

## 2020-10-11 DIAGNOSIS — R413 Other amnesia: Secondary | ICD-10-CM

## 2020-10-11 DIAGNOSIS — I152 Hypertension secondary to endocrine disorders: Secondary | ICD-10-CM

## 2020-10-11 DIAGNOSIS — D869 Sarcoidosis, unspecified: Secondary | ICD-10-CM

## 2020-10-11 NOTE — Patient Instructions (Addendum)
Visit Information  PATIENT GOALS:  Goals Addressed             This Visit's Progress    Find Help in My Community;Complete ADLs daily as able       Timeframe:  Short-Term Goal Priority:  Medium Progress: On Track Start Date:             10/11/20                Expected End Date:                   01/02/21  Follow Up Date 11/21/20   Find Help in My Community (Patient)      Manage Pain issues; Complete ADLs daily as able    Why is this important?   Knowing how and where to find help for yourself or family in your neighborhood and community is an important skill.  You will want to take some steps to learn how.   Patient Coping Skills:  Has support from sister, Eric Stuart Completes ADLs as able Attend scheduled medical appointments  Patient Deficits  Alzheimer's Disease Some mobility challenges  Patient Goals:   Patient will attend scheduled medical appointments in next 30 days Patient will call [name] or RNCM as needed in next 30 days for CCM support Patient will communicate regularly with his sister to discuss client needs within next 30 days -  Follow Up Plan:  [name] to call client  or [name], sister of client, on 11/21/20 to assess client needs           Norva Riffle.Eric Stuart MSW, [name] Licensed Clinical Social Worker [facility] Care Management [phone]

## 2020-10-11 NOTE — Chronic Care Management (AMB) (Signed)
Chronic Care Management    Clinical Social Work Note  10/11/2020 Name: Eric Stuart MRN: [id number] DOB: 1955-04-21  Eric Stuart is a 65 y.o. year old male who is a primary care patient of Eric Balloon, FNP. The CCM team was consulted to assist the patient with chronic disease management and/or care coordination needs related to: [name] .   Engaged with patient/ sister of client, Eric Stuart by telephone for follow up visit in response to provider referral for social work chronic care management and care coordination services.   Consent to Services:  The patient was given information about Chronic Care Management services, agreed to services, and gave verbal consent prior to initiation of services.  Please see initial visit note for detailed documentation.   Patient agreed to services and consent obtained.   Assessment: Review of patient past medical history, allergies, medications, and health status, including review of relevant consultants reports was performed today as part of a comprehensive evaluation and provision of chronic care management and care coordination services.     SDOH (Social Determinants of Health) assessments and interventions performed:  SDOH Interventions    Flowsheet Row Most Recent Value  SDOH Interventions   Depression Interventions/Treatment  Medication        Advanced Directives Status: See Vynca application for related entries.  CCM Care Plan  No Known Allergies  Outpatient Encounter Medications as of 10/11/2020  Medication Sig   amLODipine (NORVASC) 10 MG tablet Take 1 tablet (10 mg total) by mouth daily.   donepezil (ARICEPT) 10 MG tablet Take 1 tablet (10 mg total) by mouth at bedtime.   metFORMIN (GLUCOPHAGE) 1000 MG tablet Take 0.5 tablets (500 mg total) by mouth 2 (two) times daily with a meal.   olmesartan-hydrochlorothiazide (BENICAR HCT) 40-25 MG tablet Take 1 tablet by mouth daily.   rosuvastatin (CRESTOR) 10 MG  tablet Take 1 tablet (10 mg total) by mouth every evening. Needs to be seen for further refills.   No facility-administered encounter medications on file as of 10/11/2020.    Patient Active Problem List   Diagnosis Date Noted   Alzheimer's disease ([place]) 01/18/2020   Fetal alcohol syndrome 11/22/2019   Illiterate 11/04/2019   Mild mental slowing 11/04/2019   Hyperlipidemia due to type 2 diabetes mellitus ([place]) 04/26/2014   SARCOIDOSIS 01/31/2009   Diabetes mellitus ([place]) 01/31/2009   Hypertension associated with diabetes ([place]) 01/31/2009    Conditions to be addressed/monitored: Monitor client management of memory issues; monitor client management of pain issues   Care Plan : [name] care plan  Updates made by Eric Cabal, [name] since 10/11/2020 12:00 AM     Problem: Coping Skills (General Plan of Care)         Start Date: 10/11/2020  Expected End Date: 01/02/2021  This Visit's Progress: On track  Recent Progress: On track  Priority: Medium  Note:   Current barriers:   Patient in need of assistance with connecting to community resources for possible help with client management of current health needs and daily activities Patient is unable to independently navigate community resource options without care coordination support Pain issues Mobility issues  Clinical Goals:  patient will work with SW monthly to address concerns related to client completion of ADLs and daily activities  Patient will communicate with RNCM as needed in next 30 days for CCM support Patient will talk with [name] in next 30 days to discuss mobility challenges of client   Clinical Interventions:  Collaboration with  Eric Balloon, FNP regarding development and update of comprehensive plan of care as evidenced by provider attestation and co-signature Talked with Eric Stuart, sister of client, about client needs Talked with Eric Stuart about client recent Colonoscopy Talked with Eric Stuart about client  appointment with Eric Stuart in September of 2022 Talked with Eric Stuart about appetite of client Talked with Eric Stuart about medication procurement of client Talked with Eric Stuart about sleeping issues of client Talked with Eric Stuart about ambulation of client   Talked with Eric Stuart about memory challenges of client Eric Stuart talked about memory issues of client and she believes client memory is getting worse) Talked with Eric Stuart about family support for client Eric Stuart is supportive) Talked with Eric Stuart about transport needs of client Talked with Eric Stuart about mood of client Eric Stuart said that mood of client is doing great) Talked with Eric Stuart about ADLs completion of client Talked with Eric Stuart about upcoming client medical appointments Encouraged Eric Stuart or client to call [facility] as needed for nursing support for client Talked with Eric Stuart about energy level of client Talked with Eric Stuart about relaxation techniques of client (client watches TV westerns, likes Nascar racing, watches Mathis Bud shows) Talked with Eric Stuart about vision of client Talked with Eric Stuart about hearing needs of client Talked with Eric Stuart about fall history of client  Patient Coping Skills:  Has support from sister, Eric Stuart Completes ADLs as able Attends scheduled medical appointments  Patient Deficits  Alzheimer's Disease Some mobility challenges  Patient Goals:   Patient will attend scheduled medical appointments in next 30 days Patient will call [name] or RNCM as needed in next 30 days for CCM support Patient will communicate regularly with his sister to discuss client needs within next 30 days -  Follow Up Plan:  [name] to call client or  Eric Stuart, sister of client on 11/21/20 to assess client needs     Eric Stuart.Eric Stuart MSW, [name] Licensed Clinical Social Worker [facility] Care Management [phone]

## 2020-11-15 IMAGING — MR MR HEAD W/O CM
13 of 14 series · 38 of 48 positions shown · non-contrast
Comparison: No pertinent prior exams are available for comparison.

CLINICAL DATA: Dementia without behavioral disturbance, unspecified
dementia type.

EXAM:
MRI HEAD WITHOUT CONTRAST
TECHNIQUE: Multiplanar, multiecho pulse sequences of the brain and surrounding
structures were obtained without intravenous contrast.

[Series 5: DWI · axial · 3.0mm · 0.77mm/px · z∈[-60,+81]mm · 4 of 48 slices shown (1 of 6)]
[im 1/48]
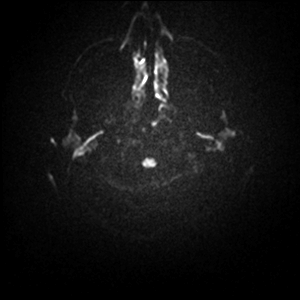
[im 16/48]
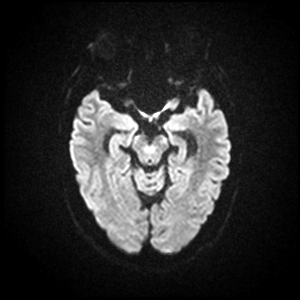
[im 32/48]
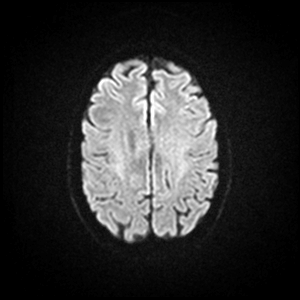
[im 48/48]
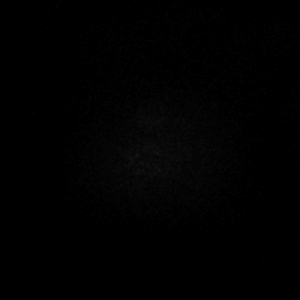

[Series 5: DWI · axial · 3.0mm · 0.77mm/px · z∈[-60,+81]mm · 4 of 48 slices shown (2 of 6)]
[im 1/48]
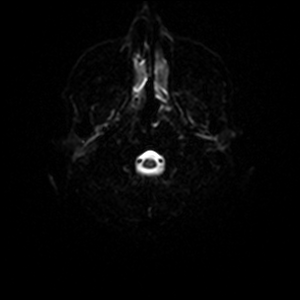
[im 16/48]
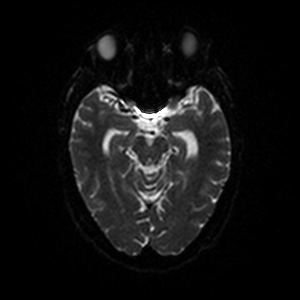
[im 32/48]
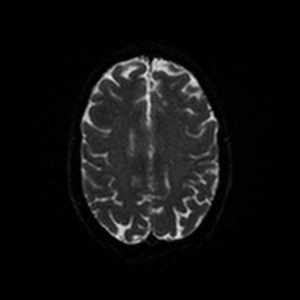
[im 48/48]
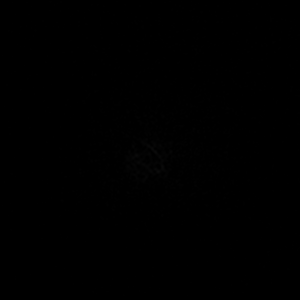

[Series 6: DWI · axial · 3.0mm · 0.77mm/px · z∈[-60,+81]mm · 4 of 48 slices shown (3 of 6)]
[im 1/48]
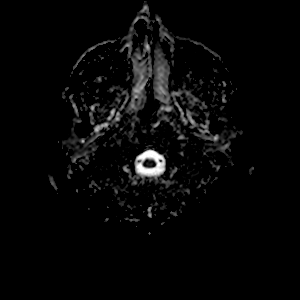
[im 16/48]
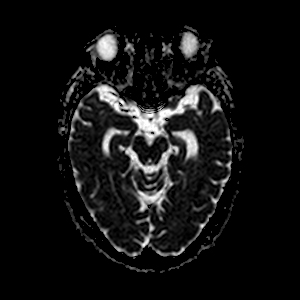
[im 32/48]
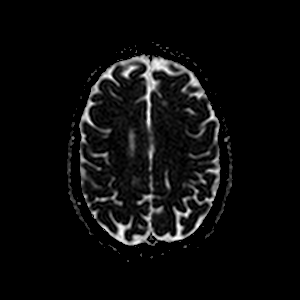
[im 48/48]
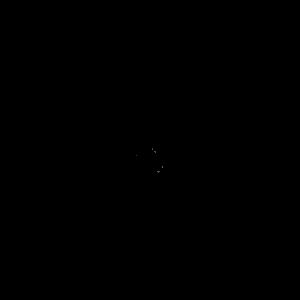

[Series 7: DWI · coronal · 5.0mm · 0.88mm/px · 3 of 28 slices shown (4 of 6)]
[im 1/28]
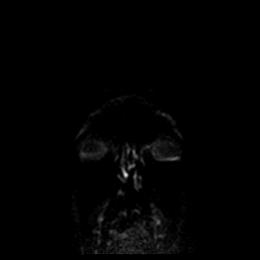
[im 14/28]
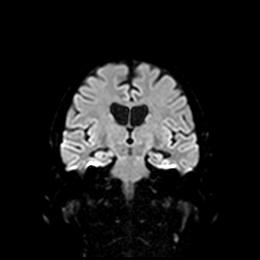
[im 28/28]
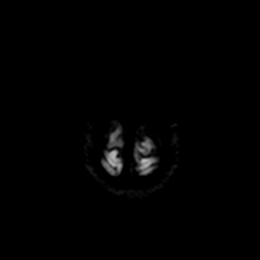

[Series 7: DWI · coronal · 5.0mm · 0.88mm/px · 2 of 28 slices shown (5 of 6)]
[im 1/28]
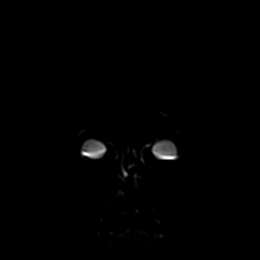
[im 28/28]
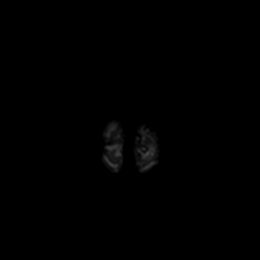

[Series 8: DWI · coronal · 5.0mm · 0.88mm/px · 2 of 28 slices shown (6 of 6)]
[im 1/28]
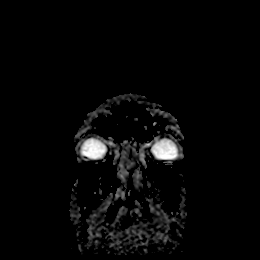
[im 28/28]
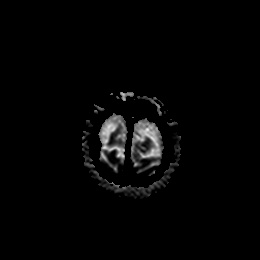

[Series 9: T1 · sagittal · 5.0mm · 0.75mm/px · 1 of 19 slices shown]
[im 1/19]
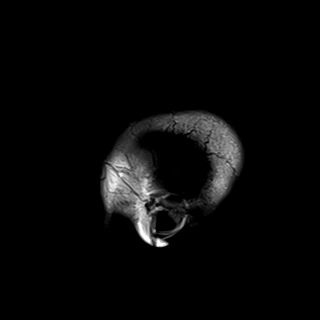

[Series 10: T2 · axial · 5.0mm · 0.72mm/px · 1 of 20 slices shown (1 of 2)]
[im 1/20]
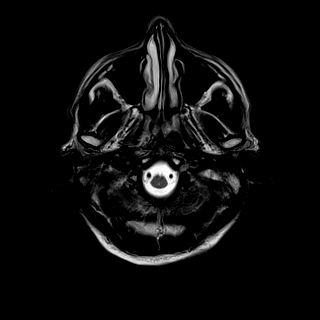

[Series 11: mag_images · axial · 3.0mm · 0.90mm/px · z∈[-79,+98]mm · 4 of 60 slices shown]
[im 1/60]
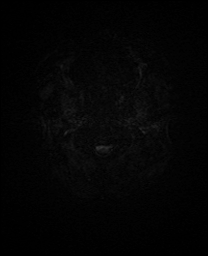
[im 20/60]
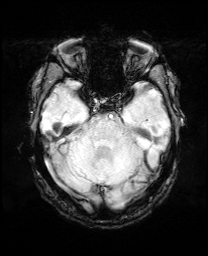
[im 40/60]
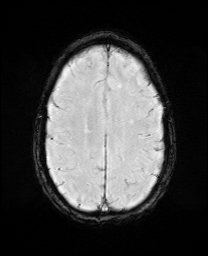
[im 60/60]
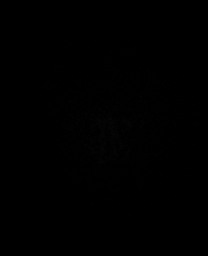

[Series 12: pha_images · axial · 3.0mm · 0.90mm/px · z∈[-76,+98]mm · 4 of 59 slices shown]
[im 1/59]
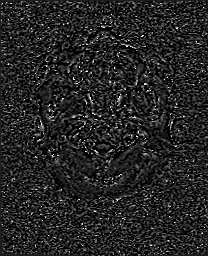
[im 20/59]
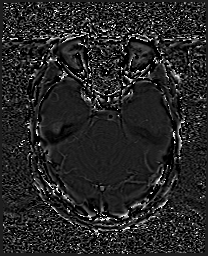
[im 39/59]
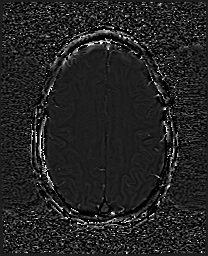
[im 59/59]
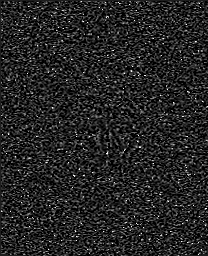

[Series 13: swi_images · axial · 3.0mm · 0.90mm/px · z∈[-79,+98]mm · 4 of 60 slices shown]
[im 1/60]
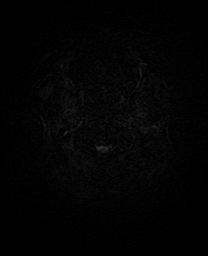
[im 20/60]
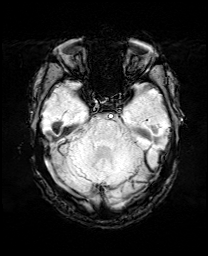
[im 40/60]
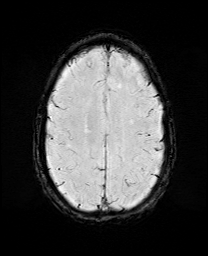
[im 60/60]
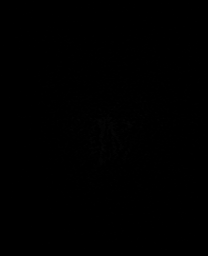

[Series 15: FLAIR · axial · 3.0mm · 0.45mm/px · z∈[-53,+73]mm · 3 of 43 slices shown]
[im 1/43]
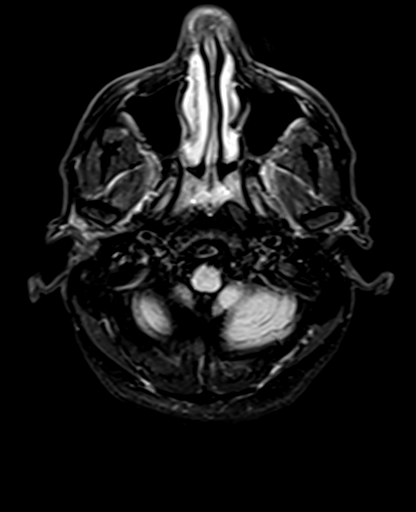
[im 22/43]
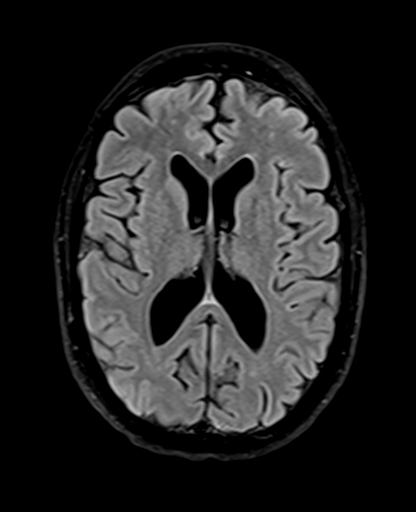
[im 43/43]
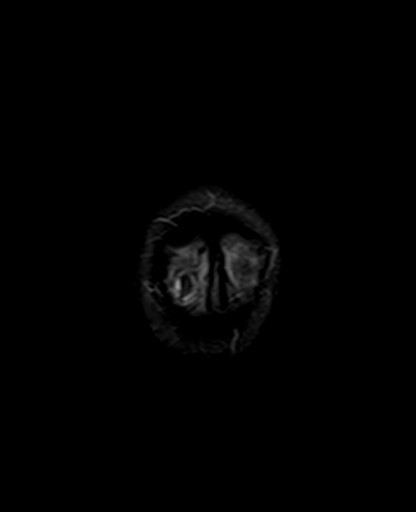

[Series 17: T2 · coronal · 5.0mm · 0.72mm/px · 2 of 28 slices shown (2 of 2)]
[im 1/28]
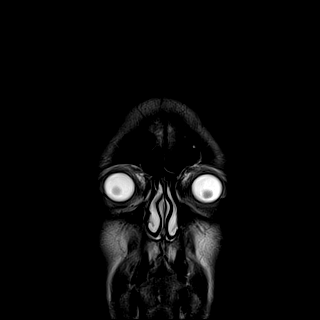
[im 28/28]
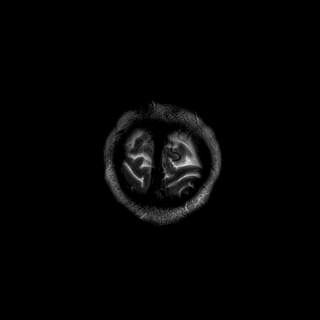

[38 of 48 positions shown; findings below may reference images not displayed]

FINDINGS: Brain:

Mild generalized cerebral atrophy without appreciable lobar
predominance.

Mild multifocal T2/FLAIR hyperintensity within the cerebral white
matter is nonspecific, but compatible with chronic small vessel
ischemic disease.

There is no acute infarct.

No evidence of intracranial mass.

No chronic intracranial blood products.

No extra-axial fluid collection.

No midline shift.

Vascular: Expected proximal arterial flow voids.

Skull and upper cervical spine: No focal marrow lesion.

Sinuses/Orbits: Visualized orbits show no acute finding. Mild
ethmoid sinus mucosal thickening. Trace fluid within the bilateral
mastoid air cells.
IMPRESSION: No evidence of acute intracranial abnormality.

Mild cerebral atrophy and chronic small vessel ischemic disease.

## 2020-11-21 ENCOUNTER — Telehealth: Payer: Medicare Other

## 2020-12-01 ENCOUNTER — Ambulatory Visit: Payer: Medicare Other | Admitting: Family

## 2020-12-09 DIAGNOSIS — J029 Acute pharyngitis, unspecified: Secondary | ICD-10-CM | POA: Diagnosis not present

## 2020-12-09 DIAGNOSIS — R059 Cough, unspecified: Secondary | ICD-10-CM | POA: Diagnosis not present

## 2020-12-09 DIAGNOSIS — J40 Bronchitis, not specified as acute or chronic: Secondary | ICD-10-CM | POA: Diagnosis not present

## 2020-12-11 ENCOUNTER — Ambulatory Visit: Payer: Medicare Other | Admitting: Family

## 2020-12-13 ENCOUNTER — Encounter: Payer: Self-pay | Admitting: Family

## 2020-12-18 DIAGNOSIS — E119 Type 2 diabetes mellitus without complications: Secondary | ICD-10-CM | POA: Diagnosis not present

## 2020-12-18 DIAGNOSIS — Q860 Fetal alcohol syndrome (dysmorphic): Secondary | ICD-10-CM | POA: Diagnosis not present

## 2020-12-18 DIAGNOSIS — R2689 Other abnormalities of gait and mobility: Secondary | ICD-10-CM | POA: Diagnosis not present

## 2020-12-18 DIAGNOSIS — G309 Alzheimer's disease, unspecified: Secondary | ICD-10-CM | POA: Diagnosis not present

## 2020-12-18 DIAGNOSIS — I10 Essential (primary) hypertension: Secondary | ICD-10-CM | POA: Diagnosis not present

## 2020-12-23 ENCOUNTER — Other Ambulatory Visit: Payer: Self-pay | Admitting: Family

## 2020-12-23 DIAGNOSIS — E1159 Type 2 diabetes mellitus with other circulatory complications: Secondary | ICD-10-CM

## 2020-12-25 ENCOUNTER — Encounter: Payer: Self-pay | Admitting: Family

## 2020-12-25 ENCOUNTER — Other Ambulatory Visit: Payer: Self-pay

## 2020-12-25 ENCOUNTER — Ambulatory Visit (INDEPENDENT_AMBULATORY_CARE_PROVIDER_SITE_OTHER): Payer: Medicare Other | Admitting: Family

## 2020-12-25 VITALS — BP 116/73 | HR 79 | Temp 97.1°F | Ht 63.0 in | Wt 114.2 lb

## 2020-12-25 DIAGNOSIS — D869 Sarcoidosis, unspecified: Secondary | ICD-10-CM | POA: Diagnosis not present

## 2020-12-25 DIAGNOSIS — E1169 Type 2 diabetes mellitus with other specified complication: Secondary | ICD-10-CM | POA: Diagnosis not present

## 2020-12-25 DIAGNOSIS — G309 Alzheimer's disease, unspecified: Secondary | ICD-10-CM

## 2020-12-25 DIAGNOSIS — Z23 Encounter for immunization: Secondary | ICD-10-CM | POA: Diagnosis not present

## 2020-12-25 DIAGNOSIS — I152 Hypertension secondary to endocrine disorders: Secondary | ICD-10-CM

## 2020-12-25 DIAGNOSIS — Z550 Illiteracy and low-level literacy: Secondary | ICD-10-CM | POA: Diagnosis not present

## 2020-12-25 DIAGNOSIS — F0280 Dementia in other diseases classified elsewhere without behavioral disturbance: Secondary | ICD-10-CM | POA: Diagnosis not present

## 2020-12-25 DIAGNOSIS — E785 Hyperlipidemia, unspecified: Secondary | ICD-10-CM

## 2020-12-25 DIAGNOSIS — E1159 Type 2 diabetes mellitus with other circulatory complications: Secondary | ICD-10-CM | POA: Diagnosis not present

## 2020-12-25 LAB — BAYER DCA HB A1C WAIVED: HB A1C (BAYER DCA - WAIVED): 6.6 % — ABNORMAL HIGH (ref 4.8–5.6)

## 2020-12-25 MED ORDER — DONEPEZIL HCL 10 MG PO TABS: 10.0000 mg | ORAL_TABLET | Freq: Every day | ORAL | 2 refills | Status: DC

## 2020-12-25 MED ORDER — ROSUVASTATIN CALCIUM 10 MG PO TABS: 10.0000 mg | ORAL_TABLET | Freq: Every evening | ORAL | 2 refills | Status: DC

## 2020-12-25 MED ORDER — AMLODIPINE BESYLATE 10 MG PO TABS: 10.0000 mg | ORAL_TABLET | Freq: Every day | ORAL | 3 refills | Status: DC

## 2020-12-25 MED ORDER — OLMESARTAN MEDOXOMIL-HCTZ 40-25 MG PO TABS: 1.0000 | ORAL_TABLET | Freq: Every day | ORAL | 1 refills | Status: DC

## 2020-12-25 MED ORDER — METFORMIN HCL 1000 MG PO TABS: 500.0000 mg | ORAL_TABLET | Freq: Two times a day (BID) | ORAL | 1 refills | Status: DC

## 2020-12-25 NOTE — Patient Instructions (Signed)
Earwax Buildup, Adult ?The ears produce a substance called earwax that helps keep bacteria out of the ear and protects the skin in the ear canal. Occasionally, earwax can build up in the ear and cause discomfort or hearing loss. ?What are the causes? ?This condition is caused by a buildup of earwax. Ear canals are self-cleaning. Ear wax is made in the outer part of the ear canal and generally falls out in small amounts over time. ?When the self-cleaning mechanism is not working, earwax builds up and can cause decreased hearing and discomfort. Attempting to clean ears with cotton swabs can push the earwax deep into the ear canal and cause decreased hearing and pain. ?What increases the risk? ?This condition is more likely to develop in people who: ?Clean their ears often with cotton swabs. ?Pick at their ears. ?Use earplugs or in-ear headphones often, or wear hearing aids. ?The following factors may also make you more likely to develop this condition: ?Being male. ?Being of older age. ?Naturally producing more earwax. ?Having narrow ear canals. ?Having earwax that is overly thick or sticky. ?Having excess hair in the ear canal. ?Having eczema. ?Being dehydrated. ?What are the signs or symptoms? ?Symptoms of this condition include: ?Reduced or muffled hearing. ?A feeling of fullness in the ear or feeling that the ear is plugged. ?Fluid coming from the ear. ?Ear pain or an itchy ear. ?Ringing in the ear. ?Coughing. ?Balance problems. ?An obvious piece of earwax that can be seen inside the ear canal. ?How is this diagnosed? ?This condition may be diagnosed based on: ?Your symptoms. ?Your medical history. ?An ear exam. During the exam, your health care provider will look into your ear with an instrument called an otoscope. ?You may have tests, including a hearing test. ?How is this treated? ?This condition may be treated by: ?Using ear drops to soften the earwax. ?Having the earwax removed by a health care provider. The  health care provider may: ?Flush the ear with water. ?Use an instrument that has a loop on the end (curette). ?Use a suction device. ?Having surgery to remove the wax buildup. This may be done in severe cases. ?Follow these instructions at home: ? ?Take over-the-counter and prescription medicines only as told by your health care provider. ?Do not put any objects, including cotton swabs, into your ear. You can clean the opening of your ear canal with a washcloth or facial tissue. ?Follow instructions from your health care provider about cleaning your ears. Do not overclean your ears. ?Drink enough fluid to keep your urine pale yellow. This will help to thin the earwax. ?Keep all follow-up visits as told. If earwax builds up in your ears often or if you use hearing aids, consider seeing your health care provider for routine, preventive ear cleanings. Ask your health care provider how often you should schedule your cleanings. ?If you have hearing aids, clean them according to instructions from the manufacturer and your health care provider. ?Contact a health care provider if: ?You have ear pain. ?You develop a fever. ?You have pus or other fluid coming from your ear. ?You have hearing loss. ?You have ringing in your ears that does not go away. ?You feel like the room is spinning (vertigo). ?Your symptoms do not improve with treatment. ?Get help right away if: ?You have bleeding from the affected ear. ?You have severe ear pain. ?Summary ?Earwax can build up in the ear and cause discomfort or hearing loss. ?The most common symptoms of this condition include   reduced or muffled hearing, a feeling of fullness in the ear, or feeling that the ear is plugged. ?This condition may be diagnosed based on your symptoms, your medical history, and an ear exam. ?This condition may be treated by using ear drops to soften the earwax or by having the earwax removed by a health care provider. ?Do not put any objects, including cotton  swabs, into your ear. You can clean the opening of your ear canal with a washcloth or facial tissue. ?This information is not intended to replace advice given to you by your health care provider. Make sure you discuss any questions you have with your health care provider. ?Document Revised: 07/06/2019 Document Reviewed: 07/06/2019 ?Elsevier Patient Education ? 2022 [employer] ? ?

## 2020-12-25 NOTE — Progress Notes (Signed)
Subjective:    Patient ID: Eric Stuart, male    DOB: 06/07/1955, 65 y.o.   MRN: [id number]  Chief Complaint  Patient presents with   Medical Management of Chronic Issues   Pt presents to the office today for chronic follow up. Pt has hx sarcoidosis. He is followed by Neurologists for Alzheimer's Disease. He was started on Aricept 10 mg. Sister states this has helped slightly.  Hypertension This is a chronic problem. The current episode started more than 1 year ago. The problem has been resolved since onset. The problem is controlled. Pertinent negatives include no blurred vision, malaise/fatigue, peripheral edema or shortness of breath. Risk factors for coronary artery disease include dyslipidemia, diabetes mellitus and male gender. The current treatment provides moderate improvement. There is no history of CVA or heart failure.  Hyperlipidemia This is a chronic problem. The current episode started more than 1 year ago. The problem is controlled. He has no history of obesity. Pertinent negatives include no shortness of breath. Current antihyperlipidemic treatment includes statins. The current treatment provides moderate improvement of lipids. Risk factors for coronary artery disease include dyslipidemia, diabetes mellitus, male sex, hypertension and a sedentary lifestyle.  Diabetes He presents for his follow-up diabetic visit. He has type 2 diabetes mellitus. Pertinent negatives for diabetes include no blurred vision and no foot paresthesias. Pertinent negatives for diabetic complications include no CVA. Risk factors for coronary artery disease include dyslipidemia, diabetes mellitus, male sex, hypertension and sedentary lifestyle. He is following a generally healthy diet. (Does not check bs at home) An ACE inhibitor/angiotensin II receptor blocker is being taken.     Review of Systems  Constitutional:  Negative for malaise/fatigue.  Eyes:  Negative for blurred vision.  Respiratory:   Negative for shortness of breath.   All other systems reviewed and are negative.     Objective:   Physical Exam Vitals reviewed.  Constitutional:      General: He is not in acute distress.    Appearance: He is well-developed.  HENT:     Head: Normocephalic.     Right Ear: Tympanic membrane normal. There is impacted cerumen.     Left Ear: Tympanic membrane normal.  Eyes:     General:        Right eye: No discharge.        Left eye: No discharge.     Pupils: Pupils are equal, round, and reactive to light.  Neck:     Thyroid: No thyromegaly.  Cardiovascular:     Rate and Rhythm: Normal rate and regular rhythm.     Heart sounds: Normal heart sounds. No murmur heard. Pulmonary:     Effort: Pulmonary effort is normal. No respiratory distress.     Breath sounds: Normal breath sounds. No wheezing.  Abdominal:     General: Bowel sounds are normal. There is no distension.     Palpations: Abdomen is soft.     Tenderness: There is no abdominal tenderness.  Musculoskeletal:        General: No tenderness. Normal range of motion.     Cervical back: Normal range of motion and neck supple.  Skin:    General: Skin is warm and dry.     Findings: No erythema or rash.  Neurological:     Mental Status: He is alert and oriented to person, place, and time.     Cranial Nerves: No cranial nerve deficit.     Deep Tendon Reflexes: Reflexes are normal and symmetric.  Psychiatric:        Behavior: Behavior normal.        Thought Content: Thought content normal.        Judgment: Judgment normal.   Right ear cleaned with warm water and peroxide. TM WNL.     BP 116/73   Pulse 79   Temp (!) 97.1 F (36.2 C) (Temporal)   Ht '5\' 3"'  (1.6 m)   Wt 114 lb 3.2 oz (51.8 kg)   BMI 20.23 kg/m   Assessment & Plan:  Eric Stuart comes in today with chief complaint of Medical Management of Chronic Issues   Diagnosis and orders addressed:  1. Alzheimer's disease ([place]) - donepezil (ARICEPT) 10 MG  tablet; Take 1 tablet (10 mg total) by mouth at bedtime.  Dispense: 90 tablet; Refill: 2 - CMP14+EGFR - CBC with Differential/Platelet  2. Hyperlipidemia due to type 2 diabetes mellitus ([facility]) - rosuvastatin (CRESTOR) 10 MG tablet; Take 1 tablet (10 mg total) by mouth every evening. Needs to be seen for further refills.  Dispense: 90 tablet; Refill: 2 - CMP14+EGFR - CBC with Differential/Platelet  3. Hypertension associated with diabetes ([place]) - olmesartan-hydrochlorothiazide (BENICAR HCT) 40-25 MG tablet; Take 1 tablet by mouth daily.  Dispense: 90 tablet; Refill: 1 - amLODipine (NORVASC) 10 MG tablet; Take 1 tablet (10 mg total) by mouth daily.  Dispense: 90 tablet; Refill: 3 - CMP14+EGFR - CBC with Differential/Platelet  4. Type 2 diabetes mellitus with other specified complication, without long-term current use of insulin ([facility]CC) - metFORMIN (GLUCOPHAGE) 1000 MG tablet; Take 0.5 tablets (500 mg total) by mouth 2 (two) times daily with a meal.  Dispense: 180 tablet; Refill: 1 - CMP14+EGFR - CBC with Differential/Platelet - Bayer DCA Hb A1c Waived  5. Sarcoidosis - CMP14+EGFR - CBC with Differential/Platelet  6. Illiterate - CMP14+EGFR - CBC with Differential/Platelet  7. Need for immunization against influenza - Flu Vaccine QUAD 73moIM (Fluarix, Fluzone & Alfiuria Quad PF) - CMP14+EGFR - CBC with Differential/Platelet   Labs pending Health Maintenance reviewed Diet and exercise encouraged  Follow up plan: 6 months   CEvelina Dun FNP

## 2020-12-26 LAB — CMP14+EGFR
ALT: 8 IU/L (ref 0–44)
AST: 14 IU/L (ref 0–40)
Albumin/Globulin Ratio: 1.1 — ABNORMAL LOW (ref 1.2–2.2)
Albumin: 4 g/dL (ref 3.8–4.8)
Alkaline Phosphatase: 63 IU/L (ref 44–121)
BUN/Creatinine Ratio: 20 (ref 10–24)
BUN: 27 mg/dL (ref 8–27)
Bilirubin Total: 0.3 mg/dL (ref 0.0–1.2)
CO2: 22 mmol/L (ref 20–29)
Calcium: 10 mg/dL (ref 8.6–10.2)
Chloride: 102 mmol/L (ref 96–106)
Creatinine, Ser: 1.32 mg/dL — ABNORMAL HIGH (ref 0.76–1.27)
Globulin, Total: 3.6 g/dL (ref 1.5–4.5)
Glucose: 109 mg/dL — ABNORMAL HIGH (ref 70–99)
Potassium: 5 mmol/L (ref 3.5–5.2)
Sodium: 139 mmol/L (ref 134–144)
Total Protein: 7.6 g/dL (ref 6.0–8.5)
eGFR: 60 mL/min/{1.73_m2} (ref >59)

## 2020-12-26 LAB — CBC WITH DIFFERENTIAL/PLATELET
Basophils Absolute: 0 10*3/uL (ref 0.0–0.2)
Basos: 0 %
EOS (ABSOLUTE): 0.1 10*3/uL (ref 0.0–0.4)
Eos: 3 %
Hematocrit: 37.2 % — ABNORMAL LOW (ref 37.5–51.0)
Hemoglobin: 12.7 g/dL — ABNORMAL LOW (ref 13.0–17.7)
Immature Grans (Abs): 0 10*3/uL (ref 0.0–0.1)
Immature Granulocytes: 0 %
Lymphocytes Absolute: 1.6 10*3/uL (ref 0.7–3.1)
Lymphs: 29 %
MCH: 27.4 pg (ref 26.6–33.0)
MCHC: 34.1 g/dL (ref 31.5–35.7)
MCV: 80 fL (ref 79–97)
Monocytes Absolute: 0.7 10*3/uL (ref 0.1–0.9)
Monocytes: 13 %
Neutrophils Absolute: 3.1 10*3/uL (ref 1.4–7.0)
Neutrophils: 55 %
Platelets: 254 10*3/uL (ref 150–450)
RBC: 4.63 x10E6/uL (ref 4.14–5.80)
RDW: 16.5 % — ABNORMAL HIGH (ref 11.6–15.4)
WBC: 5.6 10*3/uL (ref 3.4–10.8)

## 2020-12-29 ENCOUNTER — Telehealth: Payer: Medicare Other

## 2021-02-16 ENCOUNTER — Ambulatory Visit (INDEPENDENT_AMBULATORY_CARE_PROVIDER_SITE_OTHER): Payer: Medicare Other | Admitting: Licensed Clinical Social Worker

## 2021-02-16 DIAGNOSIS — D869 Sarcoidosis, unspecified: Secondary | ICD-10-CM

## 2021-02-16 DIAGNOSIS — E1159 Type 2 diabetes mellitus with other circulatory complications: Secondary | ICD-10-CM

## 2021-02-16 DIAGNOSIS — Z550 Illiteracy and low-level literacy: Secondary | ICD-10-CM

## 2021-02-16 DIAGNOSIS — E785 Hyperlipidemia, unspecified: Secondary | ICD-10-CM

## 2021-02-16 DIAGNOSIS — E1169 Type 2 diabetes mellitus with other specified complication: Secondary | ICD-10-CM

## 2021-02-16 DIAGNOSIS — I152 Hypertension secondary to endocrine disorders: Secondary | ICD-10-CM

## 2021-02-16 DIAGNOSIS — R413 Other amnesia: Secondary | ICD-10-CM

## 2021-02-16 NOTE — Patient Instructions (Addendum)
Visit Information  Patient Goals:  Find Help in My Community (Patient).  Manage Pain issues. Complete ADLs daily as able  Timeframe:  Short-Term Goal Priority:  Medium Progress: On Track Start Date:             02/16/21                Expected End Date:           05/17/21  Follow Up Date   04/18/21 at 2:30 PM   Find Help in My Community (Patient)      Manage Pain issues; Complete ADLs daily as able    Why is this important?   Knowing how and where to find help for yourself or family in your neighborhood and community is an important skill.  You will want to take some steps to learn how.   Patient Coping Skills:  Has support from sister, [name] Completes ADLs as able Attend scheduled medical appointments  Patient Deficits  Alzheimer's Disease Some mobility challenges  Patient Goals:   Patient will attend scheduled medical appointments in next 30 days Patient will call [name] or RNCM as needed in next 30 days for CCM support Patient will communicate regularly with his sister to discuss client needs within next 30 days -  Follow Up Plan:  [name] to call client or sister, [name] on 04/18/21 at 2:30 PM to assess client needs  Norva Riffle.Rylynn Schoneman MSW, [name] Licensed Clinical Social Worker [facility] Care Management [phone]

## 2021-02-16 NOTE — Chronic Care Management (AMB) (Signed)
Chronic Care Management    Clinical Social Work Note  02/16/2021 Name: Eric Stuart MRN: [id number] DOB: 23-Jun-1955  Eric Stuart is a 65 y.o. year old male who is a primary care patient of Eric Balloon, FNP. The CCM team was consulted to assist the patient with chronic disease management and/or care coordination needs related to: [name] .   Engaged with patient / sister of patient, Eric Stuart, by telephone for follow up visit in response to provider referral for social work chronic care management and care coordination services.   Consent to Services:  The patient was given information about Chronic Care Management services, agreed to services, and gave verbal consent prior to initiation of services.  Please see initial visit note for detailed documentation.   Patient agreed to services and consent obtained.   Assessment: Review of patient past medical history, allergies, medications, and health status, including review of relevant consultants reports was performed today as part of a comprehensive evaluation and provision of chronic care management and care coordination services.     SDOH (Social Determinants of Health) assessments and interventions performed:  SDOH Interventions    Flowsheet Row Most Recent Value  SDOH Interventions   Stress Interventions Provide Counseling  [client has stress related to memory issues,  client has stress related to managing medical needs]  Depression Interventions/Treatment  Currently on Treatment        Advanced Directives Status: See Vynca application for related entries.  CCM Care Plan  No Known Allergies  Outpatient Encounter Medications as of 02/16/2021  Medication Sig   amLODipine (NORVASC) 10 MG tablet Take 1 tablet (10 mg total) by mouth daily.   donepezil (ARICEPT) 10 MG tablet Take 1 tablet (10 mg total) by mouth at bedtime.   metFORMIN (GLUCOPHAGE) 1000 MG tablet Take 0.5 tablets (500 mg total) by mouth 2  (two) times daily with a meal.   olmesartan-hydrochlorothiazide (BENICAR HCT) 40-25 MG tablet Take 1 tablet by mouth daily.   rosuvastatin (CRESTOR) 10 MG tablet Take 1 tablet (10 mg total) by mouth every evening. Needs to be seen for further refills.   No facility-administered encounter medications on file as of 02/16/2021.    Patient Active Problem List   Diagnosis Date Noted   Alzheimer's disease ([place]) 01/18/2020   Fetal alcohol syndrome 11/22/2019   Illiterate 11/04/2019   Mild mental slowing 11/04/2019   Hyperlipidemia due to type 2 diabetes mellitus ([place]) 04/26/2014   SARCOIDOSIS 01/31/2009   Diabetes mellitus ([place]) 01/31/2009   Hypertension associated with diabetes ([place]) 01/31/2009    Conditions to be addressed/monitored: monitor client completion of ADLs and daily activities  Care Plan : [name] care plan  Updates made by Eric Cabal, [name] since 02/16/2021 12:00 AM     Problem: Coping Skills (General Plan of Care)      Goal: Coping Skills Enhanced;Manage Pain issues; Complete daily ADLs as able   Start Date: 02/16/2021  Expected End Date: 05/17/2021  This Visit's Progress: On track  Recent Progress: On track  Priority: Medium  Note:   Current barriers:   Patient in need of assistance with connecting to community resources for possible help with client management of current health needs and daily activities Patient is unable to independently navigate community resource options without care coordination support Memory issues  Mobility issues  Clinical Goals:  patient will work with SW monthly to address concerns related to client completion of ADLs and daily activities  Patient will communicate with [facility]  as needed in next 30 days for CCM support Patient will talk with [name] in next 30 days to discuss mobility challenges of client   Clinical Interventions:  Collaboration with Eric Balloon, FNP regarding development and update of comprehensive plan of care as  evidenced by provider attestation and co-signature Discussed client needs with Eric Stuart, sister of client. Reviewed client upcoming appointments. Eric Stuart said client has an appointment with Dr. Merlene Stuart, neurologist, in March 57f 2023 Discussed with Eric Stuart client appetite, client sleeping issues, pain issues of clinet and energy level of client Discussed client ambulation with Eric Stuart Discussed memory issues of client with Eric Stuart Discussed client ADLs completion with Eric Stuart. Discussed client mood status with Eric Stuart. She said client was in good mood. She thought his mood was stable. Encouraged Eric Stuart or client to call RNCM as needed for nursing support for client  Patient Coping Skills:  Has support from sister, Eric Stuart Completes ADLs as able Attends scheduled medical appointments  Patient Deficits  Alzheimer's Disease Some mobility challenges  Patient Goals:   Patient will attend scheduled medical appointments in next 30 days Patient will call [name] or RNCM as needed in next 30 days for CCM support Patient will communicate regularly with his sister to discuss client needs within next 30 days -  Follow Up Plan:  [name] to call client or sister, Eric Stuart, on 04/18/21 at 2:30 PM to assess client needs    Eric Stuart.Eric Stuart MSW, [name] Licensed Clinical Social Worker [facility] Care Management [phone]

## 2021-03-19 ENCOUNTER — Ambulatory Visit (INDEPENDENT_AMBULATORY_CARE_PROVIDER_SITE_OTHER): Payer: Medicare Other

## 2021-03-19 VITALS — Ht 63.0 in | Wt 114.0 lb

## 2021-03-19 DIAGNOSIS — Z0000 Encounter for general adult medical examination without abnormal findings: Secondary | ICD-10-CM | POA: Diagnosis not present

## 2021-03-19 NOTE — Progress Notes (Signed)
Subjective:   Eric Stuart is a 65 y.o. male who presents for Medicare Annual/Subsequent preventive examination.  Virtual Visit via Telephone Note  I connected with  Eric Stuart on 03/19/21 at  8:15 AM EST by telephone and verified that I am speaking with the correct person using two identifiers.  Location: Patient: [facility] Provider: [facility] Persons participating in the virtual visit: patient/sister, [employer]   I discussed the limitations, risks, security and privacy concerns of performing an evaluation and management service by telephone and the availability of in person appointments. The patient expressed understanding and agreed to proceed.  Interactive audio and video telecommunications were attempted between this nurse and patient, however failed, due to patient having technical difficulties OR patient did not have access to video capability.  We continued and completed visit with audio only.  Some vital signs may be absent or patient reported.   Eric Stuart [name], LPN   Review of Systems     Cardiac Risk Factors include: advanced age (>9men, >66 women);diabetes mellitus;dyslipidemia;hypertension;male gender     Objective:    Today's Vitals   03/19/21 0817  Weight: 114 lb (51.7 kg)  Height: 5\' 3"  (1.6 m)   Body mass index is 20.19 kg/m.  Advanced Directives 03/19/2021 03/16/2020 10/21/2019 02/13/2018  Does Patient Have a Medical Advance Directive? No Yes No No  Type of Advance Directive - Healthcare Power of [place];Living will;Out of facility DNR (pink MOST or yellow form) - -  Copy of [place] in Chart? - Yes - validated most recent copy scanned in chart (See row information) - -  Would patient like information on creating a medical advance directive? No - Guardian declined - No - Patient declined Yes (MAU/Ambulatory/Procedural Areas - Information given)    Current Medications (verified) Outpatient Encounter Medications as of  03/19/2021  Medication Sig   amLODipine (NORVASC) 10 MG tablet Take 1 tablet (10 mg total) by mouth daily.   donepezil (ARICEPT) 10 MG tablet Take 1 tablet (10 mg total) by mouth at bedtime.   metFORMIN (GLUCOPHAGE) 1000 MG tablet Take 0.5 tablets (500 mg total) by mouth 2 (two) times daily with a meal.   olmesartan-hydrochlorothiazide (BENICAR HCT) 40-25 MG tablet Take 1 tablet by mouth daily.   rosuvastatin (CRESTOR) 10 MG tablet Take 1 tablet (10 mg total) by mouth every evening. Needs to be seen for further refills.   No facility-administered encounter medications on file as of 03/19/2021.    Allergies (verified) Patient has no known allergies.   History: Past Medical History:  Diagnosis Date   Dementia ([place])    Diabetes mellitus without complication ([place])    Hyperlipidemia    Hypertension    Sarcoidosis 44   Sister provided    Past Surgical History:  Procedure Laterality Date   ADENOIDECTOMY  childhod   COLONOSCOPY  2012   TONSILLECTOMY     Family History  Problem Relation Age of Onset   Cancer Mother        cervical   Peripheral Artery Disease Father    Heart failure Father    Diabetes Sister    Heart attack Brother    Heart attack Sister    Stroke Sister    Colon cancer Neg Hx    Colon polyps Neg Hx    Esophageal cancer Neg Hx    Rectal cancer Neg Hx    Stomach cancer Neg Hx    Social History   Socioeconomic History   Marital status:  Single    Spouse name: Not on file   Number of children: 0   Years of education: 69   Highest education level: 12th grade  Occupational History   Occupation: disabled  Tobacco Use   Smoking status: Former    Packs/day: 2.00    Years: 4.00    Pack years: 8.00    Types: Cigarettes    Quit date: 09/29/2013    Years since quitting: 7.4   Smokeless tobacco: Never  Vaping Use   Vaping Use: Never used  Substance and Sexual Activity   Alcohol use: No    Alcohol/week: 0.0 standard drinks   Drug use: No   Sexual  activity: Not Currently  Other Topics Concern   Not on file  Social History Narrative   Patient is disabled and lives at home with his sister. He is very active and walks or bicycles around town. He does this daily and probably averages about 50 miles a week on his bike. He is not married and does not have any children.    Social Determinants of Health   Financial Resource Strain: Low Risk    Difficulty of Paying Living Expenses: Not hard at all  Food Insecurity: No Food Insecurity   Worried About Charity fundraiser in the Last Year: Never true   [place] in the Last Year: Never true  Transportation Needs: No Transportation Needs   Lack of Transportation (Medical): No   Lack of Transportation (Non-Medical): No  Physical Activity: Insufficiently Active   Days of Exercise per Week: 7 days   Minutes of Exercise per Session: 20 min  Stress: No Stress Concern Present   Feeling of Stress : Only a little  Social Connections: Moderately Isolated   Frequency of Communication with Friends and Family: More than three times a week   Frequency of Social Gatherings with Friends and Family: More than three times a week   Attends Religious Services: More than 4 times per year   Active Member of [employer] or Organizations: No   Attends Music therapist: Never   Marital Status: Never married    Tobacco Counseling Counseling given: Not Answered   Clinical Intake:  Pre-visit preparation completed: Yes  Pain : No/denies pain     BMI - recorded: 20.19 Nutritional Status: BMI of 19-24  Normal Nutritional Risks: None Diabetes: Yes CBG done?: No Did pt. bring in CBG monitor from home?: No  How often do you need to have someone help you when you read instructions, pamphlets, or other written materials from your doctor or pharmacy?: 3 - Sometimes  Diabetic? Nutrition Risk Assessment:  Has the patient had any N/V/D within the last 2 months?  No  Does the patient have any  non-healing wounds?  No  Has the patient had any unintentional weight loss or weight gain?  No   Diabetes:  Is the patient diabetic?  Yes  If diabetic, was a CBG obtained today?  No  Did the patient bring in their glucometer from home?  No  How often do you monitor your CBG's? never.   Financial Strains and Diabetes Management:  Are you having any financial strains with the device, your supplies or your medication? No .  Does the patient want to be seen by Chronic Care Management for management of their diabetes?  No  Would the patient like to be referred to a Nutritionist or for Diabetic Management?  No   Diabetic Exams:  Diabetic  Eye Exam: Completed 01/16/2018. Overdue for diabetic eye exam. Pt has been advised about the importance in completing this exam. [name] said she will make him an appt after first of the year  Diabetic Foot Exam: Completed 10/21/2019. Pt has been advised about the importance in completing this exam. Pt is scheduled for diabetic foot exam on next office visit.    Interpreter Needed?: No  Comments: His sister/ guardian helps with his visits Information entered by :: Jalexa Pifer, LPN   Activities of Daily Living In your present state of health, do you have any difficulty performing the following activities: 03/19/2021  Hearing? Y  Comment mild-moderate - declines hearing aids  Vision? N  Difficulty concentrating or making decisions? Y  Walking or climbing stairs? N  Dressing or bathing? N  Doing errands, shopping? Y  Comment doesn't Physiological scientist and eating ? N  Using the Toilet? N  In the past six months, have you accidently leaked urine? N  Do you have problems with loss of bowel control? N  Managing your Medications? Y  Comment sister manages  Managing your Finances? Y  Comment sister Engineer, production or managing your Housekeeping? N  Some recent data might be hidden    Patient Care Team: [name], FNP as PCP - General  (Nurse Practitioner) Melina Schools, OD (Optometry) Ilean China, RN as Case Manager [place], Norva Riffle, [name] as Social Worker (Licensed Clinical Social Worker)  Indicate any recent [employer] you may have received from other than [facility] providers in the past year (date may be approximate).     Assessment:   This is a routine wellness examination for [place].  Hearing/Vision screen Hearing Screening - Comments:: C/o moderate hearing difficulties  - declines hearing aids at this time Vision Screening - Comments:: Wears rx glasses - Behind with annual eye exams at [facility] issues and exercise activities discussed: Current Exercise Habits: Home exercise routine, Type of exercise: walking;Other - see comments (riding bicycle), Time (Minutes): 20, Frequency (Times/Week): >7, Weekly Exercise (Minutes/Week): 0, Intensity: Mild, Exercise limited by: psychological condition(s)   Goals Addressed             This Visit's Progress    Exercise 150 minutes per week (moderate activity)   Not on track    Have 3 meals a day   On track    Manage My Emotions   On track      Depression Screen PHQ 2/9 Scores 03/19/2021 02/16/2021 12/25/2020 10/11/2020 08/29/2020 08/29/2020 07/20/2020  PHQ - 2 Score 1 2 0 2 2 2 2   PHQ- 9 Score 6 9 5 6 6 6 6     Fall Risk Fall Risk  03/19/2021 03/16/2020 11/04/2019 10/21/2019 04/05/2019  Falls in the past year? 0 0 0 0 0  Number falls in past yr: 0 - - - -  Injury with Fall? 0 - - - -  Risk for fall due to : No Fall Risks - - - -  Follow up Falls prevention discussed - - - -    FALL [place]:  Any stairs in or around the home? Yes  If so, are there any without handrails? No  Home free of loose throw rugs in walkways, pet beds, electrical cords, etc? Yes  Adequate lighting in your home to reduce risk of falls? Yes   ASSISTIVE DEVICES UTILIZED TO PREVENT FALLS:  Life alert? No  Use of a cane,  walker or w/c? No   Grab bars in the bathroom? No  Shower chair or bench in shower? No  Elevated toilet seat or a handicapped toilet? No   TIMED UP AND GO:  Was the test performed? No . Telephonic visit  Cognitive Function: Cognitive status assessed by direct observation. Patient has current diagnosis of cognitive impairment. Patient is followed by neurology, Dr Merlene Laughter, for ongoing assessment. Patient is unable to complete screening 6CIT or MMSE.   MMSE - Mini Mental State Exam 11/04/2019 02/13/2018 01/30/2017  Not completed: - Unable to complete Unable to complete  Orientation to time 2 - -  Orientation to Place 2 - -  Registration 3 - -  Attention/ Calculation 0 - -  Recall 3 - -  Language- name 2 objects 2 - -  Language- repeat 0 - -  Language- follow 3 step command 3 - -  Language- read & follow direction 0 - -  Write a sentence 0 - -  Copy design 0 - -  Total score 15 - -        Immunizations Immunization History  Administered Date(s) Administered   Influenza Inj Mdck Quad Pf 12/15/2017   Influenza Split 02/26/2016, 01/02/2017   Influenza,inj,Quad PF,6+ Mos 03/01/2013, 12/29/2013, 04/05/2019, 06/01/2020, 12/25/2020   Influenza-Unspecified 12/15/2017   Moderna Sars-Covid-2 Vaccination 06/17/2019, 07/16/2019, 02/19/2020   Pneumococcal Conjugate-13 04/26/2014   Pneumococcal Polysaccharide-23 02/13/2018   Td 03/01/2013   Tdap 03/01/2013   Zoster, Live 05/04/2012    TDAP status: Up to date  Flu Vaccine status: Up to date  Pneumococcal vaccine status: Up to date  Covid-19 vaccine status: Completed vaccines  Qualifies for Shingles Vaccine? Yes   Zostavax completed Yes   Shingrix Completed?: No.    Education has been provided regarding the importance of this vaccine. Patient has been advised to call insurance company to determine out of pocket expense if they have not yet received this vaccine. Advised may also receive vaccine at local pharmacy or Health Dept. Verbalized acceptance and  understanding.  Screening Tests Health Maintenance  Topic Date Due   OPHTHALMOLOGY EXAM  01/17/2019   COVID-19 Vaccine (4 - Booster for Moderna series) 04/15/2020   FOOT EXAM  10/20/2020   Zoster Vaccines- Shingrix (1 of 2) 03/26/2021 (Originally 08/15/2006)   HEMOGLOBIN A1C  06/24/2021   Pneumococcal Vaccine 51-58 Years old (3 - PPSV23 if available, else PCV20) 02/14/2023   TETANUS/TDAP  03/02/2023   COLONOSCOPY (Pts 45-100yrs Insurance coverage will need to be confirmed)  09/14/2027   INFLUENZA VACCINE  Completed   Hepatitis C Screening  Completed   HIV Screening  Completed   HPV VACCINES  Aged Out    Health Maintenance  Health Maintenance Due  Topic Date Due   OPHTHALMOLOGY EXAM  01/17/2019   COVID-19 Vaccine (4 - Booster for Moderna series) 04/15/2020   FOOT EXAM  10/20/2020    Colorectal cancer screening: Type of screening: Colonoscopy. Completed 09/13/2020. Repeat every 7 years  Lung Cancer Screening: (Low Dose CT Chest recommended if Age 84-80 years, 30 pack-year currently smoking OR have quit w/in 15years.) does not qualify.   Additional Screening:  Hepatitis C Screening: does qualify; Completed 12/12/2016  Vision Screening: Recommended annual ophthalmology exams for early detection of glaucoma and other disorders of the eye. Is the patient up to date with their annual eye exam?  No  Who is the provider or what is the name of the office in which the patient attends annual eye exams? [facility]  [place] If pt is not established with a provider, would they like to be referred to a provider to establish care? No .   Dental Screening: Recommended annual dental exams for proper oral hygiene  Community Resource Referral / Chronic Care Management: CRR required this visit?  No   CCM required this visit?  No      Plan:     I have personally reviewed and noted the following in the patients chart:   Medical and social history Use of alcohol, tobacco or illicit drugs   Current medications and supplements including opioid prescriptions. Patient is not currently taking opioid prescriptions. Functional ability and status Nutritional status Physical activity Advanced directives List of other physicians Hospitalizations, surgeries, and ER visits in previous 12 months Vitals Screenings to include cognitive, depression, and falls Referrals and appointments  In addition, I have reviewed and discussed with patient certain preventive protocols, quality metrics, and best practice recommendations. A written personalized care plan for preventive services as well as general preventive health recommendations were provided to patient.     Sandrea Hammond, LPN   [id number]   Nurse Notes: None

## 2021-03-19 NOTE — Patient Instructions (Signed)
Eric Stuart , Thank you for taking time to come for your Medicare Wellness Visit. I appreciate your ongoing commitment to your health goals. Please review the following plan we discussed and let me know if I can assist you in the future.   Screening recommendations/referrals: Colonoscopy: Done 09/13/2020 - Repeat in 7 years Recommended yearly ophthalmology/optometry visit for glaucoma screening and checkup Recommended yearly dental visit for hygiene and checkup  Vaccinations: Influenza vaccine: Done 12/25/2020 - Repeat annually  Pneumococcal vaccine: Done 04/26/2014 & 02/13/2018 Tdap vaccine: Done 03/01/2013 - Repeat in 10 years Shingles vaccine: Due   Covid-19: Done 06/17/2019, 07/16/2019, & 02/19/2020  Advanced directives: Advance directive discussed with you today. Even though you declined this today, please call our office should you change your mind, and we can give you the proper paperwork for you to fill out.   Conditions/risks identified: Aim for 30 minutes of exercise or brisk walking each day, drink 6-8 glasses of water and eat lots of fruits and vegetables.   Next appointment: Follow up in one year for your annual wellness visit.   Preventive Care 75 Years and Older, Male  Preventive care refers to lifestyle choices and visits with your health care provider that can promote health and wellness. What does preventive care include? A yearly physical exam. This is also called an annual well check. Dental exams once or twice a year. Routine eye exams. Ask your health care provider how often you should have your eyes checked. Personal lifestyle choices, including: Daily care of your teeth and gums. Regular physical activity. Eating a healthy diet. Avoiding tobacco and drug use. Limiting alcohol use. Practicing safe sex. Taking low doses of aspirin every day. Taking vitamin and mineral supplements as recommended by your health care provider. What happens during an annual well  check? The services and screenings done by your health care provider during your annual well check will depend on your age, overall health, lifestyle risk factors, and family history of disease. Counseling  Your health care provider may ask you questions about your: Alcohol use. Tobacco use. Drug use. Emotional well-being. Home and relationship well-being. Sexual activity. Eating habits. History of falls. Memory and ability to understand (cognition). Work and work Statistician. Screening  You may have the following tests or measurements: Height, weight, and BMI. Blood pressure. Lipid and cholesterol levels. These may be checked every 5 years, or more frequently if you are over 59 years old. Skin check. Lung cancer screening. You may have this screening every year starting at age 33 if you have a 30-pack-year history of smoking and currently smoke or have quit within the past 15 years. Fecal occult blood test (FOBT) of the stool. You may have this test every year starting at age 20. Flexible sigmoidoscopy or colonoscopy. You may have a sigmoidoscopy every 5 years or a colonoscopy every 10 years starting at age 68. Prostate cancer screening. Recommendations will vary depending on your family history and other risks. Hepatitis C blood test. Hepatitis B blood test. Sexually transmitted disease (STD) testing. Diabetes screening. This is done by checking your blood sugar (glucose) after you have not eaten for a while (fasting). You may have this done every 1-3 years. Abdominal aortic aneurysm (AAA) screening. You may need this if you are a current or former smoker. Osteoporosis. You may be screened starting at age 91 if you are at high risk. Talk with your health care provider about your test results, treatment options, and if necessary, the need for  more tests. Vaccines  Your health care provider may recommend certain vaccines, such as: Influenza vaccine. This is recommended every  year. Tetanus, diphtheria, and acellular pertussis (Tdap, Td) vaccine. You may need a Td booster every 10 years. Zoster vaccine. You may need this after age 70. Pneumococcal 13-valent conjugate (PCV13) vaccine. One dose is recommended after age 76. Pneumococcal polysaccharide (PPSV23) vaccine. One dose is recommended after age 35. Talk to your health care provider about which screenings and vaccines you need and how often you need them. This information is not intended to replace advice given to you by your health care provider. Make sure you discuss any questions you have with your health care provider. Document Released: 04/14/2015 Document Revised: 12/06/2015 Document Reviewed: 01/17/2015 Elsevier Interactive Patient Education  2017 [place] Prevention in the Home Falls can cause injuries. They can happen to people of all ages. There are many things you can do to make your home safe and to help prevent falls. What can I do on the outside of my home? Regularly fix the edges of walkways and driveways and fix any cracks. Remove anything that might make you trip as you walk through a door, such as a raised step or threshold. Trim any bushes or trees on the path to your home. Use bright outdoor lighting. Clear any walking paths of anything that might make someone trip, such as rocks or tools. Regularly check to see if handrails are loose or broken. Make sure that both sides of any steps have handrails. Any raised decks and porches should have guardrails on the edges. Have any leaves, snow, or ice cleared regularly. Use sand or salt on walking paths during winter. Clean up any spills in your garage right away. This includes oil or grease spills. What can I do in the bathroom? Use night lights. Install grab bars by the toilet and in the tub and shower. Do not use towel bars as grab bars. Use non-skid mats or decals in the tub or shower. If you need to sit down in the shower, use a  plastic, non-slip stool. Keep the floor dry. Clean up any water that spills on the floor as soon as it happens. Remove soap buildup in the tub or shower regularly. Attach bath mats securely with double-sided non-slip rug tape. Do not have throw rugs and other things on the floor that can make you trip. What can I do in the bedroom? Use night lights. Make sure that you have a light by your bed that is easy to reach. Do not use any sheets or blankets that are too big for your bed. They should not hang down onto the floor. Have a firm chair that has side arms. You can use this for support while you get dressed. Do not have throw rugs and other things on the floor that can make you trip. What can I do in the kitchen? Clean up any spills right away. Avoid walking on wet floors. Keep items that you use a lot in easy-to-reach places. If you need to reach something above you, use a strong step stool that has a grab bar. Keep electrical cords out of the way. Do not use floor polish or wax that makes floors slippery. If you must use wax, use non-skid floor wax. Do not have throw rugs and other things on the floor that can make you trip. What can I do with my stairs? Do not leave any items on the stairs. Make  sure that there are handrails on both sides of the stairs and use them. Fix handrails that are broken or loose. Make sure that handrails are as long as the stairways. Check any carpeting to make sure that it is firmly attached to the stairs. Fix any carpet that is loose or worn. Avoid having throw rugs at the top or bottom of the stairs. If you do have throw rugs, attach them to the floor with carpet tape. Make sure that you have a light switch at the top of the stairs and the bottom of the stairs. If you do not have them, ask someone to add them for you. What else can I do to help prevent falls? Wear shoes that: Do not have high heels. Have rubber bottoms. Are comfortable and fit you  well. Are closed at the toe. Do not wear sandals. If you use a stepladder: Make sure that it is fully opened. Do not climb a closed stepladder. Make sure that both sides of the stepladder are locked into place. Ask someone to hold it for you, if possible. Clearly mark and make sure that you can see: Any grab bars or handrails. First and last steps. Where the edge of each step is. Use tools that help you move around (mobility aids) if they are needed. These include: Canes. Walkers. Scooters. Crutches. Turn on the lights when you go into a dark area. Replace any light bulbs as soon as they burn out. Set up your furniture so you have a clear path. Avoid moving your furniture around. If any of your floors are uneven, fix them. If there are any pets around you, be aware of where they are. Review your medicines with your doctor. Some medicines can make you feel dizzy. This can increase your chance of falling. Ask your doctor what other things that you can do to help prevent falls. This information is not intended to replace advice given to you by your health care provider. Make sure you discuss any questions you have with your health care provider. Document Released: 01/12/2009 Document Revised: 08/24/2015 Document Reviewed: 04/22/2014 Elsevier Interactive Patient Education  2017 [employer].

## 2021-04-18 ENCOUNTER — Telehealth: Payer: Medicare Other

## 2021-05-21 ENCOUNTER — Telehealth: Payer: Self-pay | Admitting: Licensed Clinical Social Worker

## 2021-05-21 ENCOUNTER — Telehealth: Payer: Medicare Other

## 2021-05-21 NOTE — Telephone Encounter (Signed)
°  Chronic Care Management     Clinical Social Work Note   05/21/20 Name: [name]           MRN: [id number]       DOB: 1955/06/17   Eric Stuart is a 66 y.o. year old male who is a primary care patient of [name], FNP. The CCM team was consulted to assist the patient with chronic disease management and/or care coordination needs related to: [name] .    Unsuccessful Phone Outreach Call to client today.  [name] did leave phone message for Eric Stuart requesting that he please call [name] at [phone] to discuss client needs .   Norva Riffle.Demetric Parslow MSW, [place] [employer] representative [facility] Care Management [phone]

## 2021-06-10 ENCOUNTER — Other Ambulatory Visit: Payer: Self-pay | Admitting: Family

## 2021-06-10 DIAGNOSIS — I152 Hypertension secondary to endocrine disorders: Secondary | ICD-10-CM

## 2021-06-10 DIAGNOSIS — E1159 Type 2 diabetes mellitus with other circulatory complications: Secondary | ICD-10-CM

## 2021-06-11 NOTE — Telephone Encounter (Signed)
30 days given ntbs  

## 2021-06-11 NOTE — Telephone Encounter (Signed)
Appt made on 07-02-2021 w/[facility]. ?

## 2021-06-18 DIAGNOSIS — Q860 Fetal alcohol syndrome (dysmorphic): Secondary | ICD-10-CM | POA: Diagnosis not present

## 2021-06-18 DIAGNOSIS — R269 Unspecified abnormalities of gait and mobility: Secondary | ICD-10-CM | POA: Diagnosis not present

## 2021-06-18 DIAGNOSIS — G309 Alzheimer's disease, unspecified: Secondary | ICD-10-CM | POA: Diagnosis not present

## 2021-06-18 DIAGNOSIS — I10 Essential (primary) hypertension: Secondary | ICD-10-CM | POA: Diagnosis not present

## 2021-06-24 ENCOUNTER — Other Ambulatory Visit: Payer: Self-pay | Admitting: Family

## 2021-06-24 DIAGNOSIS — E1159 Type 2 diabetes mellitus with other circulatory complications: Secondary | ICD-10-CM

## 2021-07-02 ENCOUNTER — Encounter: Payer: Self-pay | Admitting: Family

## 2021-07-02 ENCOUNTER — Ambulatory Visit (INDEPENDENT_AMBULATORY_CARE_PROVIDER_SITE_OTHER): Payer: Medicare Other | Admitting: Family

## 2021-07-02 VITALS — BP 134/86 | HR 51 | Temp 97.1°F | Ht 63.0 in | Wt 119.4 lb

## 2021-07-02 DIAGNOSIS — D869 Sarcoidosis, unspecified: Secondary | ICD-10-CM

## 2021-07-02 DIAGNOSIS — E1169 Type 2 diabetes mellitus with other specified complication: Secondary | ICD-10-CM | POA: Diagnosis not present

## 2021-07-02 DIAGNOSIS — F70 Mild intellectual disabilities: Secondary | ICD-10-CM | POA: Diagnosis not present

## 2021-07-02 DIAGNOSIS — G309 Alzheimer's disease, unspecified: Secondary | ICD-10-CM | POA: Diagnosis not present

## 2021-07-02 DIAGNOSIS — E785 Hyperlipidemia, unspecified: Secondary | ICD-10-CM

## 2021-07-02 DIAGNOSIS — I152 Hypertension secondary to endocrine disorders: Secondary | ICD-10-CM

## 2021-07-02 DIAGNOSIS — Z0000 Encounter for general adult medical examination without abnormal findings: Secondary | ICD-10-CM | POA: Diagnosis not present

## 2021-07-02 DIAGNOSIS — Z23 Encounter for immunization: Secondary | ICD-10-CM

## 2021-07-02 DIAGNOSIS — E1159 Type 2 diabetes mellitus with other circulatory complications: Secondary | ICD-10-CM

## 2021-07-02 DIAGNOSIS — F0280 Dementia in other diseases classified elsewhere without behavioral disturbance: Secondary | ICD-10-CM | POA: Diagnosis not present

## 2021-07-02 LAB — BAYER DCA HB A1C WAIVED: HB A1C (BAYER DCA - WAIVED): 6.5 % — ABNORMAL HIGH (ref 4.8–5.6)

## 2021-07-02 MED ORDER — ROSUVASTATIN CALCIUM 10 MG PO TABS: 10.0000 mg | ORAL_TABLET | Freq: Every evening | ORAL | 3 refills | Status: DC

## 2021-07-02 MED ORDER — METFORMIN HCL 1000 MG PO TABS: 500.0000 mg | ORAL_TABLET | Freq: Two times a day (BID) | ORAL | 3 refills | Status: DC

## 2021-07-02 MED ORDER — OLMESARTAN MEDOXOMIL-HCTZ 40-25 MG PO TABS: 1.0000 | ORAL_TABLET | Freq: Every day | ORAL | 3 refills | Status: DC

## 2021-07-02 MED ORDER — AMLODIPINE BESYLATE 10 MG PO TABS: 10.0000 mg | ORAL_TABLET | Freq: Every day | ORAL | 3 refills | Status: DC

## 2021-07-02 MED ORDER — DONEPEZIL HCL 10 MG PO TABS: 10.0000 mg | ORAL_TABLET | Freq: Every day | ORAL | 2 refills | Status: DC

## 2021-07-02 NOTE — Patient Instructions (Signed)
Alzheimer's Disease ?Alzheimer's disease is a brain disease that affects memory, thinking, language, and behavior. People with Alzheimer's disease lose mental abilities, and the disease gets worse over time. Alzheimer's disease is a form of dementia. ?What are the causes? ?This condition develops when a protein called beta-amyloid forms deposits in the brain. It is not known what causes these deposits to form. ?Alzheimer's disease may also be caused by a gene mutation that is inherited from one parent or both parents. A gene mutation is a harmful change in a gene. Not everyone who inherits the genetic mutation will get the disease. ?What increases the risk? ?You are more likely to develop this condition if you: ?Are older than age 20. ?Are male. ?Have any of these medical conditions: ?High blood pressure. ?Diabetes. ?Heart or blood vessel disease. ?Smoke. ?Have obesity. ?Have had a brain injury. ?Have had a stroke. ?Have a family history of dementia. ?What are the signs or symptoms? ?Symptoms of this condition may happen in three stages, which often overlap. ?Early stage ?In this stage, you may continue to be independent. You may still be able to drive, work, and be social. Symptoms in this stage include: ?Minor memory problems, such as forgetting a name, words, or what you did recently. ?Difficulty with: ?Paying attention. ?Communicating. ?Doing familiar tasks. ?Problem solving or doing calculations. ?Following instructions. ?Learning new things. ?Anxiety. ?Social withdrawal. ?Loss of motivation. ?Moderate stage ?In this stage, you will start to need care. Symptoms in this stage include: ?Difficulty with expressing thoughts. ?Memory loss that affects daily life. This can include forgetting: ?Recent events that have happened. ?If you have taken medicines or eaten. ?Familiar places. You may get lost while walking or driving. ?To pay bills or manage finances. ?Personal hygiene such as bathing or using the  bathroom. ?Confusion about where you are or what time it is. ?Difficulty in judging distance. ?Changes in personality, mood, and behavior. You may be moody, irritable, angry, frustrated, fearful, anxious, or suspicious. ?Poor reasoning and judgment. ?Delusions or hallucinations. ?Changes in sleep patterns. ?Severe stage ?In the final stage, you will need help with your personal care and daily activities. Symptoms in this stage include: ?Worsening memory loss. ?Personality changes. ?Loss of awareness of your surroundings. ?Changes in physical abilities, including the ability to walk, sit, and swallow. ?Difficulty in communicating. ?Inability to control your bladder and bowels. ?Increasing confusion. ?Increasing behavior changes. ?How is this diagnosed? ?This condition is diagnosed by a health care provider who specializes in diseases of the nervous system (neurologist) or one who specializes in care of the elderly (geriatrician or geriatric psychiatrist). Other causes of dementia may also be ruled out. Your health care provider will talk with you and your family, friends, or caregivers about your history and symptoms. ?A thorough medical history will be taken, and you will have a physical exam and tests. Tests may include: ?Lab tests, such as blood or urine tests. ?Imaging tests, such as a CT scan, a PET scan, or an MRI. ?A lumbar puncture. This test involves removing and testing a small amount of the fluid that surrounds the brain and spinal cord. ?An electroencephalogram (EEG). In this test, small metal discs are used to measure electrical activity in the brain. ?Memory tests, cognitive tests, and neuropsychological tests. These tests evaluate brain function. ?Genetic testing. This may be done if you have early onset of the disease (before age 52) or if other family members have the disease. ?How is this treated? ?At this time, there is no  treatment to cure Alzheimer's disease or stop it from getting worse. The  goals of treatment are: ?To manage behavioral changes. ?To provide you with a safe environment. ?To help manage daily life for you and your caregivers. ?The following treatment options are available: ?Medicines. Medicines may help the memory work better and manage behavioral symptoms. ?Cognitive therapy. Cognitive therapy provides you with education, support, and memory aids. It is most helpful in the early stages of the condition. ?Counseling or spiritual guidance. It is normal to have a lot of feelings, including anger, relief, fear, and isolation. Counseling and guidance can help you deal with these feelings. ?Caregiving. This involves having caregivers help you with your daily activities. ?Family support groups. These provide education, emotional support, and information about community resources to family members who are taking care of you. ?Follow these instructions at home: ?Medicines ?Take over-the-counter and prescription medicines only as told by your health care provider. ?Use a pill organizer or pill reminder to help you manage your medicines. ?Avoid taking medicines that can affect thinking, such as pain medicines or sleeping medicines. ?Lifestyle ?Make healthy lifestyle choices: ?Be physically active as told by your health care provider. Regular exercise may help improve symptoms. ?Do not use any products that contain nicotine or tobacco, such as cigarettes, e-cigarettes, and chewing tobacco. If you need help quitting, ask your health care provider. ?Do not drink alcohol. ?Eat a healthy diet. ?Practice stress-management techniques when you get stressed. ?Stay social. ?Drink enough fluid to keep your urine pale yellow. ?Make sure to get quality sleep. ?Avoid taking long naps during the day. Take short naps of 30 minutes or less if needed. ?Keep your sleeping area dark and cool. ?Avoid exercising during the few hours before you go to bed. ?Avoid caffeine products in the afternoon and evening. ?General  instructions ?Work with your health care provider to determine what you need help with and what your safety needs are. ?If you were given a bracelet that identifies you as a person with memory loss or tracks your location, make sure to wear it at all times. ?Talk with your health care provider about whether it is safe for you to drive. ?Work with your family to make important decisions, such as advance directives, medical power of attorney, or a living will. ?Keep all follow-up visits. This is important. ?Where to find more information ?The [employer] Association: Call the 24-hour helpline at 1-575-585-5119, or visit [employer] ?Contact a health care provider if: ?You have nausea, vomiting, or trouble with eating related to a medicine. ?You have worsening mood or behavior changes, such as depression, anxiety, or hallucinations. ?You or your family members become concerned for your safety. ?Get help right away if: ?You become less responsive or are difficult to wake up. ?Your memory suddenly gets worse. ?You feel that you want to harm yourself. ?If you ever feel like you may hurt yourself or others, or have thoughts about taking your own life, get help right away. Go to your nearest emergency department or: ?Call your local emergency services (911 in the [place] ?Call a suicide crisis helpline, such as the [facility] at 253-276-1884 or 988 in the [place]. This is open 24 hours a day in the [place] ?Text the Crisis Text Line at 337-792-2276 (in the [place].). ?Summary ?Alzheimer's disease is a brain disease that affects memory, thinking, language, and behavior. Alzheimer's disease is a form of dementia. ?This condition is diagnosed by a specialist in diseases of the nervous system (neurologist)  or one who specializes in care of the elderly. ?At this time, there is no treatment to cure Alzheimer's disease or stop it from getting worse. The goal of treatment is to help you manage any symptoms. ?Work with  your family to make important decisions, such as advance directives, medical power of attorney, or a living will. ?This information is not intended to replace advice given to you by your health care provider. Make sure

## 2021-07-02 NOTE — Progress Notes (Signed)
? ?Subjective:  ? ? Patient ID: Eric Stuart, male    DOB: [birth year], 66 y.o.   MRN: [id number] ? ?Chief Complaint  ?Patient presents with  ? Medical Management of Chronic Issues  ? ?Pt presents to the office today for CPE and chronic follow up. Pt has hx sarcoidosis. He is followed by Neurologists for Alzheimer's Disease. He was started on Aricept 10 mg. Sister states this has helped slightly. She states some days are better than others.  ?Hypertension ?This is a chronic problem. The current episode started more than 1 year ago. The problem has been resolved since onset. The problem is controlled. Pertinent negatives include no blurred vision, malaise/fatigue, peripheral edema or shortness of breath. Risk factors for coronary artery disease include dyslipidemia, diabetes mellitus, male gender, sedentary lifestyle and smoking/tobacco exposure. The current treatment provides moderate improvement.  ?Hyperlipidemia ?This is a chronic problem. The current episode started more than 1 year ago. The problem is controlled. Recent lipid tests were reviewed and are normal. Pertinent negatives include no shortness of breath. Current antihyperlipidemic treatment includes statins. The current treatment provides moderate improvement of lipids. Risk factors for coronary artery disease include dyslipidemia, diabetes mellitus, hypertension and a sedentary lifestyle.  ?Diabetes ?He presents for his follow-up diabetic visit. He has type 2 diabetes mellitus. Pertinent negatives for diabetes include no blurred vision and no foot paresthesias. Risk factors for coronary artery disease include dyslipidemia, diabetes mellitus, hypertension and sedentary lifestyle. (Does not check BS at home)  ? ? ? ?Review of Systems  ?Constitutional:  Negative for malaise/fatigue.  ?Eyes:  Negative for blurred vision.  ?Respiratory:  Negative for shortness of breath.   ?All other systems reviewed and are negative. ? ?Family History  ?Problem Relation Age  of Onset  ? Cancer Mother   ?     cervical  ? Peripheral Artery Disease Father   ? Heart failure Father   ? Diabetes Sister   ? Heart attack Brother   ? Heart attack Sister   ? Stroke Sister   ? Colon cancer Neg Hx   ? Colon polyps Neg Hx   ? Esophageal cancer Neg Hx   ? Rectal cancer Neg Hx   ? Stomach cancer Neg Hx   ? ?Social History  ? ?Socioeconomic History  ? Marital status: Single  ?  Spouse name: Not on file  ? Number of children: 0  ? Years of education: 69  ? Highest education level: 12th grade  ?Occupational History  ? Occupation: disabled  ?Tobacco Use  ? Smoking status: Former  ?  Packs/day: 2.00  ?  Years: 4.00  ?  Pack years: 8.00  ?  Types: Cigarettes  ?  Quit date: 09/29/2013  ?  Years since quitting: 7.7  ? Smokeless tobacco: Never  ?Vaping Use  ? Vaping Use: Never used  ?Substance and Sexual Activity  ? Alcohol use: No  ?  Alcohol/week: 0.0 standard drinks  ? Drug use: No  ? Sexual activity: Not Currently  ?Other Topics Concern  ? Not on file  ?Social History Narrative  ? Patient is disabled and lives at home with his sister. He is very active and walks or bicycles around town. He does this daily and probably averages about 50 miles a week on his bike. He is not married and does not have any children.   ? ?Social Determinants of Health  ? ?Financial Resource Strain: Low Risk   ? Difficulty of Paying Living Expenses: Not hard at  all  ?Food Insecurity: No Food Insecurity  ? Worried About Charity fundraiser in the Last Year: Never true  ? Ran Out of Food in the Last Year: Never true  ?Transportation Needs: No Transportation Needs  ? Lack of Transportation (Medical): No  ? Lack of Transportation (Non-Medical): No  ?Physical Activity: Insufficiently Active  ? Days of Exercise per Week: 7 days  ? Minutes of Exercise per Session: 20 min  ?Stress: No Stress Concern Present  ? Feeling of Stress : Only a little  ?Social Connections: Moderately Isolated  ? Frequency of Communication with Friends and Family:  More than three times a week  ? Frequency of Social Gatherings with Friends and Family: More than three times a week  ? Attends Religious Services: More than 4 times per year  ? Active Member of Clubs or Organizations: No  ? Attends Archivist Meetings: Never  ? Marital Status: Never married  ? ? ?   ?Objective:  ? Physical Exam ?Vitals reviewed.  ?Constitutional:   ?   General: He is not in acute distress. ?   Appearance: He is well-developed.  ?HENT:  ?   Head: Normocephalic.  ?   Right Ear: Tympanic membrane normal.  ?   Left Ear: Tympanic membrane normal.  ?Eyes:  ?   General:     ?   Right eye: No discharge.     ?   Left eye: No discharge.  ?   Pupils: Pupils are equal, round, and reactive to light.  ?Neck:  ?   Thyroid: No thyromegaly.  ?Cardiovascular:  ?   Rate and Rhythm: Normal rate and regular rhythm.  ?   Heart sounds: Normal heart sounds. No murmur heard. ?Pulmonary:  ?   Effort: Pulmonary effort is normal. No respiratory distress.  ?   Breath sounds: Normal breath sounds. No wheezing.  ?Abdominal:  ?   General: Bowel sounds are normal. There is no distension.  ?   Palpations: Abdomen is soft.  ?   Tenderness: There is no abdominal tenderness.  ?Musculoskeletal:     ?   General: No tenderness. Normal range of motion.  ?   Cervical back: Normal range of motion and neck supple.  ?Skin: ?   General: Skin is warm and dry.  ?   Findings: No erythema or rash.  ?Neurological:  ?   Mental Status: He is alert and oriented to person, place, and time.  ?   Cranial Nerves: No cranial nerve deficit.  ?   Deep Tendon Reflexes: Reflexes are normal and symmetric.  ?Psychiatric:     ?   Behavior: Behavior normal.     ?   Thought Content: Thought content normal.     ?   Judgment: Judgment normal.  ? ? ?Diabetic Foot Exam - Simple   ?Simple Foot Form ?Diabetic Foot exam was performed with the following findings: Yes 07/02/2021 10:52 AM  ?Visual Inspection ?No deformities, no ulcerations, no other skin breakdown  bilaterally: Yes ?Sensation Testing ?Intact to touch and monofilament testing bilaterally: Yes ?Pulse Check ?Posterior Tibialis and Dorsalis pulse intact bilaterally: Yes ?Comments ?Toenails thick  ?  ? ? ? ?BP 134/86   Pulse (!) 51   Temp (!) 97.1 ?F (36.2 ?C) (Temporal)   Ht '5\' 3"'  (1.6 m)   Wt 119 lb 6.4 oz (54.2 kg)   BMI 21.15 kg/m?  ? ?   ?Assessment & Plan:  ?Eric Stuart comes in today with  chief complaint of Medical Management of Chronic Issues ? ? ?Diagnosis and orders addressed: ? ?1. Alzheimer's disease ([place]) ?- donepezil (ARICEPT) 10 MG tablet; Take 1 tablet (10 mg total) by mouth at bedtime.  Dispense: 90 tablet; Refill: 2 ?- CMP14+EGFR ?- CBC with Differential/Platelet ? ?2. Hyperlipidemia due to type 2 diabetes mellitus (HCC) ?- rosuvastatin (CRESTOR) 10 MG tablet; Take 1 tablet (10 mg total) by mouth every evening. Needs to be seen for further refills.  Dispense: 90 tablet; Refill: 3 ?- CMP14+EGFR ?- CBC with Differential/Platelet ? ?3. Hypertension associated with diabetes ([place]) ?- olmesartan-hydrochlorothiazide (BENICAR HCT) 40-25 MG tablet; Take 1 tablet by mouth daily.  Dispense: 90 tablet; Refill: 3 ?- amLODipine (NORVASC) 10 MG tablet; Take 1 tablet (10 mg total) by mouth daily.  Dispense: 90 tablet; Refill: 3 ?- CMP14+EGFR ? ?4. Type 2 diabetes mellitus with other specified complication, without long-term current use of insulin (HCC) ?- metFORMIN (GLUCOPHAGE) 1000 MG tablet; Take 0.5 tablets (500 mg total) by mouth 2 (two) times daily with a meal.  Dispense: 180 tablet; Refill: 3 ?- CMP14+EGFR ?- CBC with Differential/Platelet ?- Bayer DCA Hb A1c Waived ? ?5. Sarcoidosis ?- CMP14+EGFR ?- CBC with Differential/Platelet ? ?6. Mild mental slowing ?- CMP14+EGFR ?- CBC with Differential/Platelet ? ?7. Annual physical exam ?- CMP14+EGFR ?- CBC with Differential/Platelet ?- Lipid panel ?- PSA, total and free ?- TSH ? ? ?Labs pending ?Health Maintenance reviewed ?Diet and exercise  encouraged ? ?Follow up plan: ?6 months  ? ? ?[name], FNP ? ? ? ?

## 2021-07-03 ENCOUNTER — Other Ambulatory Visit: Payer: Self-pay | Admitting: Family

## 2021-07-03 DIAGNOSIS — R7989 Other specified abnormal findings of blood chemistry: Secondary | ICD-10-CM

## 2021-07-03 LAB — TSH: TSH: 2.25 u[IU]/mL (ref 0.450–4.500)

## 2021-07-03 LAB — CBC WITH DIFFERENTIAL/PLATELET
Basophils Absolute: 0 10*3/uL (ref 0.0–0.2)
Basos: 1 %
EOS (ABSOLUTE): 0.3 10*3/uL (ref 0.0–0.4)
Eos: 6 %
Hematocrit: 34.4 % — ABNORMAL LOW (ref 37.5–51.0)
Hemoglobin: 11.4 g/dL — ABNORMAL LOW (ref 13.0–17.7)
Immature Grans (Abs): 0 10*3/uL (ref 0.0–0.1)
Immature Granulocytes: 0 %
Lymphocytes Absolute: 1.3 10*3/uL (ref 0.7–3.1)
Lymphs: 27 %
MCH: 27.2 pg (ref 26.6–33.0)
MCHC: 33.1 g/dL (ref 31.5–35.7)
MCV: 82 fL (ref 79–97)
Monocytes Absolute: 0.7 10*3/uL (ref 0.1–0.9)
Monocytes: 15 %
Neutrophils Absolute: 2.3 10*3/uL (ref 1.4–7.0)
Neutrophils: 51 %
Platelets: 227 10*3/uL (ref 150–450)
RBC: 4.19 x10E6/uL (ref 4.14–5.80)
RDW: 16.1 % — ABNORMAL HIGH (ref 11.6–15.4)
WBC: 4.6 10*3/uL (ref 3.4–10.8)

## 2021-07-03 LAB — CMP14+EGFR
ALT: 11 IU/L (ref 0–44)
AST: 20 IU/L (ref 0–40)
Albumin/Globulin Ratio: 1.6 (ref 1.2–2.2)
Albumin: 4.5 g/dL (ref 3.8–4.8)
Alkaline Phosphatase: 60 IU/L (ref 44–121)
BUN/Creatinine Ratio: 15 (ref 10–24)
BUN: 25 mg/dL (ref 8–27)
Bilirubin Total: 0.6 mg/dL (ref 0.0–1.2)
CO2: 24 mmol/L (ref 20–29)
Calcium: 9.8 mg/dL (ref 8.6–10.2)
Chloride: 105 mmol/L (ref 96–106)
Creatinine, Ser: 1.66 mg/dL — ABNORMAL HIGH (ref 0.76–1.27)
Globulin, Total: 2.8 g/dL (ref 1.5–4.5)
Glucose: 109 mg/dL — ABNORMAL HIGH (ref 70–99)
Potassium: 4.3 mmol/L (ref 3.5–5.2)
Sodium: 141 mmol/L (ref 134–144)
Total Protein: 7.3 g/dL (ref 6.0–8.5)
eGFR: 46 mL/min/{1.73_m2} — ABNORMAL LOW (ref >59)

## 2021-07-03 LAB — LIPID PANEL
Chol/HDL Ratio: 2.3 ratio (ref 0.0–5.0)
Cholesterol, Total: 174 mg/dL (ref 100–199)
HDL: 76 mg/dL (ref >39)
LDL Chol Calc (NIH): 86 mg/dL (ref 0–99)
Triglycerides: 65 mg/dL (ref 0–149)
VLDL Cholesterol Cal: 12 mg/dL (ref 5–40)

## 2021-07-03 LAB — PSA, TOTAL AND FREE
PSA, Free Pct: 17.1 %
PSA, Free: 0.24 ng/mL
Prostate Specific Ag, Serum: 1.4 ng/mL (ref 0.0–4.0)

## 2021-07-04 DIAGNOSIS — H2513 Age-related nuclear cataract, bilateral: Secondary | ICD-10-CM | POA: Diagnosis not present

## 2021-07-04 DIAGNOSIS — E119 Type 2 diabetes mellitus without complications: Secondary | ICD-10-CM | POA: Diagnosis not present

## 2021-07-04 DIAGNOSIS — H52223 Regular astigmatism, bilateral: Secondary | ICD-10-CM | POA: Diagnosis not present

## 2021-07-04 DIAGNOSIS — H524 Presbyopia: Secondary | ICD-10-CM | POA: Diagnosis not present

## 2021-07-04 DIAGNOSIS — H5203 Hypermetropia, bilateral: Secondary | ICD-10-CM | POA: Diagnosis not present

## 2021-07-04 DIAGNOSIS — H43812 Vitreous degeneration, left eye: Secondary | ICD-10-CM | POA: Diagnosis not present

## 2021-07-04 LAB — HM DIABETES EYE EXAM

## 2021-07-05 LAB — SPECIMEN STATUS REPORT

## 2021-07-05 LAB — B12 AND FOLATE PANEL
Folate: 13.4 ng/mL (ref >3.0)
Vitamin B-12: 497 pg/mL (ref 232–1245)

## 2021-07-18 ENCOUNTER — Encounter: Payer: Self-pay | Admitting: Emergency Medicine

## 2021-10-10 ENCOUNTER — Ambulatory Visit: Payer: Self-pay | Admitting: *Deleted

## 2021-10-10 NOTE — Chronic Care Management (AMB) (Signed)
  Chronic Care Management   Note  10/10/2021 Name: Eric Stuart MRN: [id number] DOB: [birth year]   Patient is stable from [facility] Management perspective or has not recently engaged with the [facility]. I am removing RN Care Manager from Care Team and closing [place]. If patient is currently engaged with another CCM team member I will forward this encounter to inform them of my case closure. Patient may be eligible for re-engagement with RN Care Manager in the future if necessary and can discuss this with their PCP.  Chong Sicilian, BSN, RN-BC Embedded Chronic Care Manager [facility] Direct Dial: [phone]

## 2021-12-10 DIAGNOSIS — I10 Essential (primary) hypertension: Secondary | ICD-10-CM | POA: Diagnosis not present

## 2021-12-10 DIAGNOSIS — R69 Illness, unspecified: Secondary | ICD-10-CM | POA: Diagnosis not present

## 2021-12-10 DIAGNOSIS — G309 Alzheimer's disease, unspecified: Secondary | ICD-10-CM | POA: Diagnosis not present

## 2021-12-10 DIAGNOSIS — R269 Unspecified abnormalities of gait and mobility: Secondary | ICD-10-CM | POA: Diagnosis not present

## 2021-12-13 ENCOUNTER — Ambulatory Visit (INDEPENDENT_AMBULATORY_CARE_PROVIDER_SITE_OTHER): Payer: Medicare HMO | Admitting: Family

## 2021-12-13 ENCOUNTER — Encounter: Payer: Self-pay | Admitting: Family

## 2021-12-13 VITALS — BP 123/83 | HR 68 | Temp 98.2°F | Ht 63.0 in | Wt 125.8 lb

## 2021-12-13 DIAGNOSIS — F0280 Dementia in other diseases classified elsewhere without behavioral disturbance: Secondary | ICD-10-CM

## 2021-12-13 DIAGNOSIS — R69 Illness, unspecified: Secondary | ICD-10-CM | POA: Diagnosis not present

## 2021-12-13 DIAGNOSIS — M79642 Pain in left hand: Secondary | ICD-10-CM

## 2021-12-13 DIAGNOSIS — E1159 Type 2 diabetes mellitus with other circulatory complications: Secondary | ICD-10-CM

## 2021-12-13 DIAGNOSIS — I152 Hypertension secondary to endocrine disorders: Secondary | ICD-10-CM

## 2021-12-13 DIAGNOSIS — G5602 Carpal tunnel syndrome, left upper limb: Secondary | ICD-10-CM

## 2021-12-13 DIAGNOSIS — E1169 Type 2 diabetes mellitus with other specified complication: Secondary | ICD-10-CM | POA: Diagnosis not present

## 2021-12-13 DIAGNOSIS — G309 Alzheimer's disease, unspecified: Secondary | ICD-10-CM

## 2021-12-13 MED ORDER — DICLOFENAC SODIUM 75 MG PO TBEC: 75.0000 mg | DELAYED_RELEASE_TABLET | Freq: Two times a day (BID) | ORAL | 1 refills | Status: DC

## 2021-12-13 NOTE — Progress Notes (Signed)
Subjective:    Patient ID: Eric Stuart, male    DOB: Jul 13, 1955, 66 y.o.   MRN: [id number]  Chief Complaint  Patient presents with   Hand Pain   Pt presents to the office today with left hand pain that started hurting in the last week that radiating up his arm.   States he has pain in his thumb and index finger that radiates up his wrist. His pain is constant, but worse at night.   He is followed by Neurologists for Alzheimer disease. His Neurologists is retiring and requesting a new referral today.  Hand Pain  The incident occurred more than 1 week ago. There was no injury mechanism. The pain is present in the left hand. The quality of the pain is described as aching. The pain radiates to the left arm. The pain is mild. The pain has been Intermittent since the incident. Pertinent negatives include no muscle weakness, numbness or tingling. The symptoms are aggravated by movement. He has tried rest and NSAIDs for the symptoms. The treatment provided mild relief.  Diabetes He presents for his follow-up diabetic visit. He has type 2 diabetes mellitus. There are no hypoglycemic associated symptoms. Pertinent negatives for diabetes include no blurred vision and no foot paresthesias. Symptoms are stable. Risk factors for coronary artery disease include dyslipidemia, diabetes mellitus, hypertension and sedentary lifestyle. He is following a generally healthy diet.  Hypertension This is a chronic problem. The current episode started more than 1 year ago. The problem has been resolved since onset. The problem is controlled. Pertinent negatives include no blurred vision, malaise/fatigue, peripheral edema or shortness of breath. Risk factors for coronary artery disease include dyslipidemia and sedentary lifestyle. The current treatment provides moderate improvement.      Review of Systems  Constitutional:  Negative for malaise/fatigue.  Eyes:  Negative for blurred vision.  Respiratory:  Negative  for shortness of breath.   Neurological:  Negative for tingling and numbness.  All other systems reviewed and are negative.      Objective:   Physical Exam Vitals reviewed.  Constitutional:      General: He is not in acute distress.    Appearance: He is well-developed.  HENT:     Head: Normocephalic.     Right Ear: External ear normal.     Left Ear: External ear normal.  Eyes:     General:        Right eye: No discharge.        Left eye: No discharge.     Pupils: Pupils are equal, round, and reactive to light.  Neck:     Thyroid: No thyromegaly.  Cardiovascular:     Rate and Rhythm: Normal rate and regular rhythm.     Heart sounds: Normal heart sounds. No murmur heard. Pulmonary:     Effort: Pulmonary effort is normal. No respiratory distress.     Breath sounds: Normal breath sounds. No wheezing.  Abdominal:     General: Bowel sounds are normal. There is no distension.     Palpations: Abdomen is soft.     Tenderness: There is no abdominal tenderness.  Musculoskeletal:        General: No tenderness. Normal range of motion.     Cervical back: Normal range of motion and neck supple.     Comments: Positive phalen sign, negative tinel sign   Skin:    General: Skin is warm and dry.     Findings: No erythema or rash.  Neurological:  Mental Status: He is alert and oriented to person, place, and time.     Cranial Nerves: No cranial nerve deficit.     Deep Tendon Reflexes: Reflexes are normal and symmetric.  Psychiatric:        Behavior: Behavior normal.        Thought Content: Thought content normal.        Cognition and Memory: Cognition is impaired. Memory is impaired. He exhibits impaired recent memory and impaired remote memory.        Judgment: Judgment normal.      BP 123/83   Pulse 68   Temp 98.2 F (36.8 C)   Ht '5\' 3"'$  (1.6 m)   Wt 125 lb 12.8 oz (57.1 kg)   SpO2 98%   BMI 22.28 kg/m       Assessment & Plan:  Eric Stuart comes in today with chief  complaint of Hand Pain   Diagnosis and orders addressed:  1. Left hand pain - diclofenac (VOLTAREN) 75 MG EC tablet; Take 1 tablet (75 mg total) by mouth 2 (two) times daily.  Dispense: 180 tablet; Refill: 1  2. Carpal tunnel syndrome of left wrist Start Diclofenac BID with food No other NSAID's Wrist splint placed, wear at night and when riding bike - diclofenac (VOLTAREN) 75 MG EC tablet; Take 1 tablet (75 mg total) by mouth 2 (two) times daily.  Dispense: 180 tablet; Refill: 1  3. Alzheimer's disease ([place]) - Ambulatory referral to Neurology  4. Type 2 diabetes mellitus with other specified complication, without long-term current use of insulin (HCC) - Microalbumin / creatinine urine ratio  5. Hypertension associated with diabetes ([place])   Labs pending Health Maintenance reviewed Diet and exercise encouraged  Follow up plan: Keep chronic follow up   Eric Dun, FNP

## 2021-12-13 NOTE — Patient Instructions (Signed)

## 2021-12-15 LAB — MICROALBUMIN / CREATININE URINE RATIO
Creatinine, Urine: 134.9 mg/dL
Microalb/Creat Ratio: 18 mg/g creat (ref 0–29)
Microalbumin, Urine: 23.8 ug/mL

## 2021-12-24 ENCOUNTER — Encounter: Payer: Self-pay | Admitting: Physician Assistant

## 2021-12-24 DIAGNOSIS — G5602 Carpal tunnel syndrome, left upper limb: Secondary | ICD-10-CM | POA: Diagnosis not present

## 2022-01-07 ENCOUNTER — Encounter: Payer: Self-pay | Admitting: Family

## 2022-01-07 ENCOUNTER — Ambulatory Visit (INDEPENDENT_AMBULATORY_CARE_PROVIDER_SITE_OTHER): Payer: Medicare HMO | Admitting: Family

## 2022-01-07 VITALS — BP 131/83 | HR 58 | Temp 97.6°F | Ht 63.0 in | Wt 126.0 lb

## 2022-01-07 DIAGNOSIS — E1169 Type 2 diabetes mellitus with other specified complication: Secondary | ICD-10-CM | POA: Diagnosis not present

## 2022-01-07 DIAGNOSIS — R69 Illness, unspecified: Secondary | ICD-10-CM | POA: Diagnosis not present

## 2022-01-07 DIAGNOSIS — F0280 Dementia in other diseases classified elsewhere without behavioral disturbance: Secondary | ICD-10-CM

## 2022-01-07 DIAGNOSIS — E1159 Type 2 diabetes mellitus with other circulatory complications: Secondary | ICD-10-CM

## 2022-01-07 DIAGNOSIS — Z23 Encounter for immunization: Secondary | ICD-10-CM

## 2022-01-07 DIAGNOSIS — G309 Alzheimer's disease, unspecified: Secondary | ICD-10-CM

## 2022-01-07 DIAGNOSIS — E785 Hyperlipidemia, unspecified: Secondary | ICD-10-CM

## 2022-01-07 DIAGNOSIS — D649 Anemia, unspecified: Secondary | ICD-10-CM | POA: Diagnosis not present

## 2022-01-07 DIAGNOSIS — D869 Sarcoidosis, unspecified: Secondary | ICD-10-CM

## 2022-01-07 DIAGNOSIS — I152 Hypertension secondary to endocrine disorders: Secondary | ICD-10-CM

## 2022-01-07 LAB — BAYER DCA HB A1C WAIVED: HB A1C (BAYER DCA - WAIVED): 6.8 % — ABNORMAL HIGH (ref 4.8–5.6)

## 2022-01-07 MED ORDER — METFORMIN HCL 1000 MG PO TABS: 500.0000 mg | ORAL_TABLET | Freq: Two times a day (BID) | ORAL | 3 refills | Status: DC

## 2022-01-07 NOTE — Progress Notes (Signed)
Subjective:    Patient ID: Eric Stuart, male    DOB: 01/08/1956, 66 y.o.   MRN: [id number]  Chief Complaint  Patient presents with   Medical Management of Chronic Issues    FLU SHOT TODAY    Pt presents to the office today for chronic follow up. Pt has hx sarcoidosis. He is followed by Neurologists for Alzheimer's Disease. He was started on Aricept 10 mg and namenda 5 mg BID. Sister states this has helped slightly. She states some days are better than others.  Hypertension This is a chronic problem. The current episode started more than 1 year ago. The problem has been resolved since onset. The problem is controlled. Pertinent negatives include no blurred vision, malaise/fatigue, peripheral edema or shortness of breath. Risk factors for coronary artery disease include dyslipidemia, diabetes mellitus and male gender. The current treatment provides significant improvement. There is no history of heart failure.  Hyperlipidemia This is a chronic problem. The current episode started more than 1 year ago. The problem is controlled. Recent lipid tests were reviewed and are normal. Pertinent negatives include no shortness of breath. Current antihyperlipidemic treatment includes statins. The current treatment provides moderate improvement of lipids. Risk factors for coronary artery disease include dyslipidemia, hypertension and male sex.  Diabetes He presents for his follow-up diabetic visit. He has type 2 diabetes mellitus. Pertinent negatives for diabetes include no blurred vision and no foot paresthesias. Symptoms are stable. Risk factors for coronary artery disease include diabetes mellitus, dyslipidemia, male sex, hypertension and sedentary lifestyle. He is following a generally healthy diet. (Does not check at home) Eye exam is current.      Review of Systems  Constitutional:  Negative for malaise/fatigue.  Eyes:  Negative for blurred vision.  Respiratory:  Negative for shortness of breath.    All other systems reviewed and are negative.      Objective:   Physical Exam Vitals reviewed.  Constitutional:      General: He is not in acute distress.    Appearance: He is well-developed.  HENT:     Head: Normocephalic.     Right Ear: Tympanic membrane normal.     Left Ear: Tympanic membrane normal.  Eyes:     General:        Right eye: No discharge.        Left eye: No discharge.     Pupils: Pupils are equal, round, and reactive to light.  Neck:     Thyroid: No thyromegaly.  Cardiovascular:     Rate and Rhythm: Normal rate and regular rhythm.     Heart sounds: Normal heart sounds. No murmur heard. Pulmonary:     Effort: Pulmonary effort is normal. No respiratory distress.     Breath sounds: Normal breath sounds. No wheezing.  Abdominal:     General: Bowel sounds are normal. There is no distension.     Palpations: Abdomen is soft.     Tenderness: There is no abdominal tenderness.  Musculoskeletal:        General: No tenderness. Normal range of motion.     Cervical back: Normal range of motion and neck supple.  Skin:    General: Skin is warm and dry.     Findings: No erythema or rash.  Neurological:     Mental Status: He is alert and oriented to person, place, and time.     Cranial Nerves: No cranial nerve deficit.     Deep Tendon Reflexes: Reflexes are normal and  symmetric.  Psychiatric:        Behavior: Behavior normal.        Thought Content: Thought content normal.        Judgment: Judgment normal.       BP 131/83   Pulse (!) 58   Temp 97.6 F (36.4 C) (Temporal)   Ht _0  (1.6 m)   Wt 126 lb (57.2 kg)   BMI 22.32 kg/m      Assessment & Plan:   Shaheem Pichon comes in today with chief complaint of Medical Management of Chronic Issues (FLU SHOT TODAY )   Diagnosis and orders addressed:  1. Type 2 diabetes mellitus with other specified complication, without long-term current use of insulin (HCC) - metFORMIN (GLUCOPHAGE) 1000 MG tablet; Take  0.5 tablets (500 mg total) by mouth 2 (two) times daily with a meal.  Dispense: 180 tablet; Refill: 3 - Bayer DCA Hb A1c Waived - CMP14+EGFR  2. Need for immunization against influenza - Flu Vaccine QUAD High Dose(Fluad) - CMP14+EGFR  3. Hypertension associated with diabetes ([place]) - CMP14+EGFR  4. Hyperlipidemia due to type 2 diabetes mellitus (HCC) - CMP14+EGFR  5. Alzheimer's disease ([place]) - CMP14+EGFR  6. Sarcoidosis - CMP14+EGFR  7. Low hemoglobin - CMP14+EGFR - Iron, TIBC and Ferritin Panel   Labs pending Health Maintenance reviewed Diet and exercise encouraged  Follow up plan: 6 months    [name], FNP

## 2022-01-07 NOTE — Patient Instructions (Signed)
Health Maintenance After Age 65 After age 65, you are at a higher risk for certain long-term diseases and infections as well as injuries from falls. Falls are a major cause of broken bones and head injuries in people who are older than age 65. Getting regular preventive care can help to keep you healthy and well. Preventive care includes getting regular testing and making lifestyle changes as recommended by your health care provider. Talk with your health care provider about: Which screenings and tests you should have. A screening is a test that checks for a disease when you have no symptoms. A diet and exercise plan that is right for you. What should I know about screenings and tests to prevent falls? Screening and testing are the best ways to find a health problem early. Early diagnosis and treatment give you the best chance of managing medical conditions that are common after age 65. Certain conditions and lifestyle choices may make you more likely to have a fall. Your health care provider may recommend: Regular vision checks. Poor vision and conditions such as cataracts can make you more likely to have a fall. If you wear glasses, make sure to get your prescription updated if your vision changes. Medicine review. Work with your health care provider to regularly review all of the medicines you are taking, including over-the-counter medicines. Ask your health care provider about any side effects that may make you more likely to have a fall. Tell your health care provider if any medicines that you take make you feel dizzy or sleepy. Strength and balance checks. Your health care provider may recommend certain tests to check your strength and balance while standing, walking, or changing positions. Foot health exam. Foot pain and numbness, as well as not wearing proper footwear, can make you more likely to have a fall. Screenings, including: Osteoporosis screening. Osteoporosis is a condition that causes  the bones to get weaker and break more easily. Blood pressure screening. Blood pressure changes and medicines to control blood pressure can make you feel dizzy. Depression screening. You may be more likely to have a fall if you have a fear of falling, feel depressed, or feel unable to do activities that you used to do. Alcohol use screening. Using too much alcohol can affect your balance and may make you more likely to have a fall. Follow these instructions at home: Lifestyle Do not drink alcohol if: Your health care provider tells you not to drink. If you drink alcohol: Limit how much you have to: 0-1 drink a day for women. 0-2 drinks a day for men. Know how much alcohol is in your drink. In the [place] one drink equals one 12 oz bottle of beer (355 mL), one 5 oz glass of wine (148 mL), or one 1 oz glass of hard liquor (44 mL). Do not use any products that contain nicotine or tobacco. These products include cigarettes, chewing tobacco, and vaping devices, such as e-cigarettes. If you need help quitting, ask your health care provider. Activity  Follow a regular exercise program to stay fit. This will help you maintain your balance. Ask your health care provider what types of exercise are appropriate for you. If you need a cane or walker, use it as recommended by your health care provider. Wear supportive shoes that have nonskid soles. Safety  Remove any tripping hazards, such as rugs, cords, and clutter. Install safety equipment such as grab bars in bathrooms and safety rails on stairs. Keep rooms and walkways   well-lit. General instructions Talk with your health care provider about your risks for falling. Tell your health care provider if: You fall. Be sure to tell your health care provider about all falls, even ones that seem minor. You feel dizzy, tiredness (fatigue), or off-balance. Take over-the-counter and prescription medicines only as told by your health care provider. These include  supplements. Eat a healthy diet and maintain a healthy weight. A healthy diet includes low-fat dairy products, low-fat (lean) meats, and fiber from whole grains, beans, and lots of fruits and vegetables. Stay current with your vaccines. Schedule regular health, dental, and eye exams. Summary Having a healthy lifestyle and getting preventive care can help to protect your health and wellness after age 65. Screening and testing are the best way to find a health problem early and help you avoid having a fall. Early diagnosis and treatment give you the best chance for managing medical conditions that are more common for people who are older than age 65. Falls are a major cause of broken bones and head injuries in people who are older than age 65. Take precautions to prevent a fall at home. Work with your health care provider to learn what changes you can make to improve your health and wellness and to prevent falls. This information is not intended to replace advice given to you by your health care provider. Make sure you discuss any questions you have with your health care provider. Document Revised: 08/07/2020 Document Reviewed: 08/07/2020 Elsevier Patient Education  2023 Elsevier Inc.  

## 2022-01-08 LAB — CMP14+EGFR
ALT: 20 IU/L (ref 0–44)
AST: 20 IU/L (ref 0–40)
Albumin/Globulin Ratio: 1.4 (ref 1.2–2.2)
Albumin: 4.1 g/dL (ref 3.9–4.9)
Alkaline Phosphatase: 63 IU/L (ref 44–121)
BUN/Creatinine Ratio: 15 (ref 10–24)
BUN: 28 mg/dL — ABNORMAL HIGH (ref 8–27)
Bilirubin Total: 0.3 mg/dL (ref 0.0–1.2)
CO2: 21 mmol/L (ref 20–29)
Calcium: 9.7 mg/dL (ref 8.6–10.2)
Chloride: 105 mmol/L (ref 96–106)
Creatinine, Ser: 1.85 mg/dL — ABNORMAL HIGH (ref 0.76–1.27)
Globulin, Total: 3 g/dL (ref 1.5–4.5)
Glucose: 116 mg/dL — ABNORMAL HIGH (ref 70–99)
Potassium: 4.8 mmol/L (ref 3.5–5.2)
Sodium: 141 mmol/L (ref 134–144)
Total Protein: 7.1 g/dL (ref 6.0–8.5)
eGFR: 40 mL/min/{1.73_m2} — ABNORMAL LOW (ref >59)

## 2022-01-08 LAB — IRON,TIBC AND FERRITIN PANEL
Ferritin: 59 ng/mL (ref 30–400)
Iron Saturation: 25 % (ref 15–55)
Iron: 69 ug/dL (ref 38–169)
Total Iron Binding Capacity: 277 ug/dL (ref 250–450)
UIBC: 208 ug/dL (ref 111–343)

## 2022-01-16 ENCOUNTER — Other Ambulatory Visit: Payer: Self-pay | Admitting: Family Medicine

## 2022-01-16 DIAGNOSIS — R7989 Other specified abnormal findings of blood chemistry: Secondary | ICD-10-CM

## 2022-01-21 ENCOUNTER — Other Ambulatory Visit: Payer: Medicare HMO

## 2022-01-21 DIAGNOSIS — R7989 Other specified abnormal findings of blood chemistry: Secondary | ICD-10-CM

## 2022-01-23 LAB — CMP14+EGFR
ALT: 12 IU/L (ref 0–44)
AST: 16 IU/L (ref 0–40)
Albumin/Globulin Ratio: 1.6 (ref 1.2–2.2)
Albumin: 4.8 g/dL (ref 3.9–4.9)
Alkaline Phosphatase: 69 IU/L (ref 44–121)
BUN/Creatinine Ratio: 15 (ref 10–24)
BUN: 27 mg/dL (ref 8–27)
Bilirubin Total: 0.4 mg/dL (ref 0.0–1.2)
CO2: 20 mmol/L (ref 20–29)
Calcium: 9.8 mg/dL (ref 8.6–10.2)
Chloride: 107 mmol/L — ABNORMAL HIGH (ref 96–106)
Creatinine, Ser: 1.78 mg/dL — ABNORMAL HIGH (ref 0.76–1.27)
Globulin, Total: 3 g/dL (ref 1.5–4.5)
Glucose: 126 mg/dL — ABNORMAL HIGH (ref 70–99)
Potassium: 4.4 mmol/L (ref 3.5–5.2)
Sodium: 144 mmol/L (ref 134–144)
Total Protein: 7.8 g/dL (ref 6.0–8.5)
eGFR: 42 mL/min/{1.73_m2} — ABNORMAL LOW (ref >59)

## 2022-01-24 ENCOUNTER — Telehealth: Payer: Self-pay

## 2022-01-24 NOTE — Telephone Encounter (Signed)
Pt has been notified.

## 2022-02-02 DIAGNOSIS — E119 Type 2 diabetes mellitus without complications: Secondary | ICD-10-CM | POA: Diagnosis not present

## 2022-02-02 DIAGNOSIS — Z8249 Family history of ischemic heart disease and other diseases of the circulatory system: Secondary | ICD-10-CM | POA: Diagnosis not present

## 2022-02-02 DIAGNOSIS — Z809 Family history of malignant neoplasm, unspecified: Secondary | ICD-10-CM | POA: Diagnosis not present

## 2022-02-02 DIAGNOSIS — I10 Essential (primary) hypertension: Secondary | ICD-10-CM | POA: Diagnosis not present

## 2022-02-02 DIAGNOSIS — E785 Hyperlipidemia, unspecified: Secondary | ICD-10-CM | POA: Diagnosis not present

## 2022-02-02 DIAGNOSIS — Z7984 Long term (current) use of oral hypoglycemic drugs: Secondary | ICD-10-CM | POA: Diagnosis not present

## 2022-02-02 DIAGNOSIS — R69 Illness, unspecified: Secondary | ICD-10-CM | POA: Diagnosis not present

## 2022-02-02 DIAGNOSIS — Z008 Encounter for other general examination: Secondary | ICD-10-CM | POA: Diagnosis not present

## 2022-02-02 DIAGNOSIS — Z87891 Personal history of nicotine dependence: Secondary | ICD-10-CM | POA: Diagnosis not present

## 2022-03-04 DIAGNOSIS — G309 Alzheimer's disease, unspecified: Secondary | ICD-10-CM | POA: Diagnosis not present

## 2022-03-04 DIAGNOSIS — I10 Essential (primary) hypertension: Secondary | ICD-10-CM | POA: Diagnosis not present

## 2022-03-04 DIAGNOSIS — R69 Illness, unspecified: Secondary | ICD-10-CM | POA: Diagnosis not present

## 2022-03-04 DIAGNOSIS — R269 Unspecified abnormalities of gait and mobility: Secondary | ICD-10-CM | POA: Diagnosis not present

## 2022-03-20 ENCOUNTER — Ambulatory Visit (INDEPENDENT_AMBULATORY_CARE_PROVIDER_SITE_OTHER): Payer: Medicare HMO

## 2022-03-20 VITALS — Ht 63.0 in | Wt 126.0 lb

## 2022-03-20 DIAGNOSIS — Z0000 Encounter for general adult medical examination without abnormal findings: Secondary | ICD-10-CM | POA: Diagnosis not present

## 2022-03-20 NOTE — Patient Instructions (Signed)
[name] , Thank you for taking time to come for your Medicare Wellness Visit. I appreciate your ongoing commitment to your health goals. Please review the following plan we discussed and let me know if I can assist you in the future.   These are the goals we discussed:  Goals       Client will talk with [name] in next 30 days about clinet management of health needs faced (pt-stated)      CARE PLAN ENTRY   Current Barriers:  Patient with Chronic Diagnoses of Sarcodosis, DM, HTN, HLD Hearing difficulty  Clinical Social Work Clinical Goal(s):  [name] to call client in next 30 days to talk with client about client management of health needs faced  Interventions: Talked with [name], sister and caregiver for client about CCM program Talked with [name] about mobility of client Talked with [name] about ADLs completion of client Talked with [name] about relaxation techniques of client (likes istening to music, likes to watch car racing, likes to visit with family and friends) Talked with [name] about upcoming medical appointments of client Encouraged [name] or client to talk with [facility] as needed related to nursing needs of client Talked with [name] about social support network of client (sister, [name] is supportive) Talked with [name] about client's management of Diabetes   Patient Self Care Activities:   Completes ADLs independently Attends scheduled medical appointments   Patient Self Care Deficits:  Hearing difficulty  Initial goal documentation       Exercise 150 minutes per week (moderate activity)      Find Help in My Community;Complete ADLs daily as able. Manage Pain issues      Timeframe:  Short-Term Goal Priority:  Medium Progress: On Track Start Date:             02/16/21                Expected End Date:           05/17/21  Follow Up Date   04/18/21 at 2:30 PM   Find Help in My Community (Patient)      Manage Pain issues; Complete ADLs daily as able    Why is  this important?   Knowing how and where to find help for yourself or family in your neighborhood and community is an important skill.  You will want to take some steps to learn how.   Patient Coping Skills:  Has support from sister, [name] Completes ADLs as able Attend scheduled medical appointments  Patient Deficits  Alzheimer's Disease Some mobility challenges  Patient Goals:   Patient will attend scheduled medical appointments in next 30 days Patient will call [name] or RNCM as needed in next 30 days for CCM support Patient will communicate regularly with his sister to discuss client needs within next 30 days -  Follow Up Plan:  [name] to call client or sister, [name] on 04/18/21 at 2:30 PM to assess client needs        Have 3 meals a day      Manage My Emotions      Patient Stated      03/16/2020 AWV Goal: Keep All Scheduled Appointments  Over the next year, patient will attend all scheduled appointments with their PCP and any specialists that they see.       Prevent falls      Increased problems with instability         This is a list of the  screening recommended for you and due dates:  Health Maintenance  Topic Date Due   COVID-19 Vaccine (4 - 2023-24 season) 11/30/2021   Zoster (Shingles) Vaccine (2 of 2) 04/09/2022*   Complete foot exam   07/03/2022   Eye exam for diabetics  07/05/2022   Hemoglobin A1C  07/09/2022   Yearly kidney health urinalysis for diabetes  12/14/2022   Yearly kidney function blood test for diabetes  01/22/2023   Pneumonia Vaccine (3 - PPSV23 or PCV20) 02/14/2023   DTaP/Tdap/Td vaccine (4 - Td or Tdap) 03/02/2023   Medicare Annual Wellness Visit  03/21/2023   Colon Cancer Screening  09/14/2027   Flu Shot  Completed   Hepatitis C Screening: USPSTF Recommendation to screen - Ages 18-79 yo.  Completed   HIV Screening  Completed   HPV Vaccine  Aged Out  *Topic was postponed. The date shown is not the original due date.     Advanced directives: We have a copy of your advanced directives in your chart in case it is ever needed by your provider.   Conditions/risks identified: Aim for 30 minutes of exercise or brisk walking, 6-8 glasses of water, and 5 servings of fruits and vegetables each day.   Next appointment: Follow up in one year for your annual wellness visit.   Preventive Care 52 Years and Older, Male  Preventive care refers to lifestyle choices and visits with your health care provider that can promote health and wellness. What does preventive care include? A yearly physical exam. This is also called an annual well check. Dental exams once or twice a year. Routine eye exams. Ask your health care provider how often you should have your eyes checked. Personal lifestyle choices, including: Daily care of your teeth and gums. Regular physical activity. Eating a healthy diet. Avoiding tobacco and drug use. Limiting alcohol use. Practicing safe sex. Taking low doses of aspirin every day. Taking vitamin and mineral supplements as recommended by your health care provider. What happens during an annual well check? The services and screenings done by your health care provider during your annual well check will depend on your age, overall health, lifestyle risk factors, and family history of disease. Counseling  Your health care provider may ask you questions about your: Alcohol use. Tobacco use. Drug use. Emotional well-being. Home and relationship well-being. Sexual activity. Eating habits. History of falls. Memory and ability to understand (cognition). Work and work Statistician. Screening  You may have the following tests or measurements: Height, weight, and BMI. Blood pressure. Lipid and cholesterol levels. These may be checked every 5 years, or more frequently if you are over 56 years old. Skin check. Lung cancer screening. You may have this screening every year starting at age 38 if you  have a 30-pack-year history of smoking and currently smoke or have quit within the past 15 years. Fecal occult blood test (FOBT) of the stool. You may have this test every year starting at age 37. Flexible sigmoidoscopy or colonoscopy. You may have a sigmoidoscopy every 5 years or a colonoscopy every 10 years starting at age 65. Prostate cancer screening. Recommendations will vary depending on your family history and other risks. Hepatitis C blood test. Hepatitis B blood test. Sexually transmitted disease (STD) testing. Diabetes screening. This is done by checking your blood sugar (glucose) after you have not eaten for a while (fasting). You may have this done every 1-3 years. Abdominal aortic aneurysm (AAA) screening. You may need this if you are a  current or former smoker. Osteoporosis. You may be screened starting at age 38 if you are at high risk. Talk with your health care provider about your test results, treatment options, and if necessary, the need for more tests. Vaccines  Your health care provider may recommend certain vaccines, such as: Influenza vaccine. This is recommended every year. Tetanus, diphtheria, and acellular pertussis (Tdap, Td) vaccine. You may need a Td booster every 10 years. Zoster vaccine. You may need this after age 60. Pneumococcal 13-valent conjugate (PCV13) vaccine. One dose is recommended after age 74. Pneumococcal polysaccharide (PPSV23) vaccine. One dose is recommended after age 69. Talk to your health care provider about which screenings and vaccines you need and how often you need them. This information is not intended to replace advice given to you by your health care provider. Make sure you discuss any questions you have with your health care provider. Document Released: 04/14/2015 Document Revised: 12/06/2015 Document Reviewed: 01/17/2015 Elsevier Interactive Patient Education  2017 [employer] Prevention in the Home Falls can cause injuries.  They can happen to people of all ages. There are many things you can do to make your home safe and to help prevent falls. What can I do on the outside of my home? Regularly fix the edges of walkways and driveways and fix any cracks. Remove anything that might make you trip as you walk through a door, such as a raised step or threshold. Trim any bushes or trees on the path to your home. Use bright outdoor lighting. Clear any walking paths of anything that might make someone trip, such as rocks or tools. Regularly check to see if handrails are loose or broken. Make sure that both sides of any steps have handrails. Any raised decks and porches should have guardrails on the edges. Have any leaves, snow, or ice cleared regularly. Use sand or salt on walking paths during winter. Clean up any spills in your garage right away. This includes oil or grease spills. What can I do in the bathroom? Use night lights. Install grab bars by the toilet and in the tub and shower. Do not use towel bars as grab bars. Use non-skid mats or decals in the tub or shower. If you need to sit down in the shower, use a plastic, non-slip stool. Keep the floor dry. Clean up any water that spills on the floor as soon as it happens. Remove soap buildup in the tub or shower regularly. Attach bath mats securely with double-sided non-slip rug tape. Do not have throw rugs and other things on the floor that can make you trip. What can I do in the bedroom? Use night lights. Make sure that you have a light by your bed that is easy to reach. Do not use any sheets or blankets that are too big for your bed. They should not hang down onto the floor. Have a firm chair that has side arms. You can use this for support while you get dressed. Do not have throw rugs and other things on the floor that can make you trip. What can I do in the kitchen? Clean up any spills right away. Avoid walking on wet floors. Keep items that you use a lot  in easy-to-reach places. If you need to reach something above you, use a strong step stool that has a grab bar. Keep electrical cords out of the way. Do not use floor polish or wax that makes floors slippery. If you must use  wax, use non-skid floor wax. Do not have throw rugs and other things on the floor that can make you trip. What can I do with my stairs? Do not leave any items on the stairs. Make sure that there are handrails on both sides of the stairs and use them. Fix handrails that are broken or loose. Make sure that handrails are as long as the stairways. Check any carpeting to make sure that it is firmly attached to the stairs. Fix any carpet that is loose or worn. Avoid having throw rugs at the top or bottom of the stairs. If you do have throw rugs, attach them to the floor with carpet tape. Make sure that you have a light switch at the top of the stairs and the bottom of the stairs. If you do not have them, ask someone to add them for you. What else can I do to help prevent falls? Wear shoes that: Do not have high heels. Have rubber bottoms. Are comfortable and fit you well. Are closed at the toe. Do not wear sandals. If you use a stepladder: Make sure that it is fully opened. Do not climb a closed stepladder. Make sure that both sides of the stepladder are locked into place. Ask someone to hold it for you, if possible. Clearly mark and make sure that you can see: Any grab bars or handrails. First and last steps. Where the edge of each step is. Use tools that help you move around (mobility aids) if they are needed. These include: Canes. Walkers. Scooters. Crutches. Turn on the lights when you go into a dark area. Replace any light bulbs as soon as they burn out. Set up your furniture so you have a clear path. Avoid moving your furniture around. If any of your floors are uneven, fix them. If there are any pets around you, be aware of where they are. Review your medicines  with your doctor. Some medicines can make you feel dizzy. This can increase your chance of falling. Ask your doctor what other things that you can do to help prevent falls. This information is not intended to replace advice given to you by your health care provider. Make sure you discuss any questions you have with your health care provider. Document Released: 01/12/2009 Document Revised: 08/24/2015 Document Reviewed: 04/22/2014 Elsevier Interactive Patient Education  2017 [employer].

## 2022-03-20 NOTE — Progress Notes (Signed)
Subjective:   Eric Stuart is a 66 y.o. male who presents for Medicare Annual/Subsequent preventive examination.  I connected with  [name] on 03/20/22 by a audio enabled telemedicine application and verified that I am speaking with the correct person using two identifiers.  Patient Location: Home  Provider Location: Office/Clinic  I discussed the limitations of evaluation and management by telemedicine. The patient expressed understanding and agreed to proceed.  Review of Systems     Cardiac Risk Factors include: advanced age (>84mn, >>46women);hypertension;male gender;diabetes mellitus;dyslipidemia     Objective:    Today's Vitals   03/20/22 0850  Weight: 126 lb (57.2 kg)  Height: _0  (1.6 m)   Body mass index is 22.32 kg/m.     03/20/2022    9:21 AM 03/19/2021    8:29 AM 03/16/2020    8:40 AM 10/21/2019   11:49 AM 02/13/2018    3:22 PM  Advanced Directives  Does Patient Have a Medical Advance Directive? Yes No Yes No No  Type of Advance Directive Living will;Healthcare Power of [place];Out of facility DNR (pink MOST or yellow form)    Does patient want to make changes to medical advance directive? No - Patient declined      Copy of HHoughtonin Chart? Yes - validated most recent copy scanned in chart (See row information)  Yes - validated most recent copy scanned in chart (See row information)    Would patient like information on creating a medical advance directive?  No - Guardian declined  No - Patient declined Yes (MAU/Ambulatory/Procedural Areas - Information given)    Current Medications (verified) Outpatient Encounter Medications as of 03/20/2022  Medication Sig   amLODipine (NORVASC) 10 MG tablet Take 1 tablet (10 mg total) by mouth daily.   diclofenac (VOLTAREN) 75 MG EC tablet Take 1 tablet (75 mg total) by mouth 2 (two) times daily.   donepezil (ARICEPT) 10 MG tablet Take 1 tablet  (10 mg total) by mouth at bedtime.   memantine (NAMENDA) 10 MG tablet Take 10 mg by mouth 2 (two) times daily.   metFORMIN (GLUCOPHAGE) 1000 MG tablet Take 0.5 tablets (500 mg total) by mouth 2 (two) times daily with a meal.   olmesartan-hydrochlorothiazide (BENICAR HCT) 40-25 MG tablet Take 1 tablet by mouth daily.   rosuvastatin (CRESTOR) 10 MG tablet Take 1 tablet (10 mg total) by mouth every evening. Needs to be seen for further refills.   [DISCONTINUED] memantine (NAMENDA) 5 MG tablet Take 5 mg by mouth 2 (two) times daily.   No facility-administered encounter medications on file as of 03/20/2022.    Allergies (verified) Patient has no known allergies.   History: Past Medical History:  Diagnosis Date   Dementia ([place]    [place]    Hyperlipidemia    Hypertension    Sarcoidosis 161  Sister provided    Past Surgical History:  Procedure Laterality Date   ADENOIDECTOMY  childhod   COLONOSCOPY  2012   TONSILLECTOMY     Family History  Problem Relation Age of Onset   Cancer Mother        cervical   Peripheral Artery Disease Father    Heart failure Father    Diabetes Sister    Heart attack Brother    Heart attack Sister    Stroke Sister    Colon cancer Neg Hx    Colon polyps Neg Hx  Esophageal cancer Neg Hx    Rectal cancer Neg Hx    Stomach cancer Neg Hx    Social History   Socioeconomic History   Marital status: Single    Spouse name: Not on file   Number of children: 0   Years of education: 79   Highest education level: 12th grade  Occupational History   Occupation: disabled  Tobacco Use   Smoking status: Former    Packs/day: 2.00    Years: 4.00    Total pack years: 8.00    Types: Cigarettes    Quit date: 09/29/2013    Years since quitting: 8.4   Smokeless tobacco: Never  Vaping Use   Vaping Use: Never used  Substance and Sexual Activity   Alcohol use: No    Alcohol/week: 0.0 standard drinks of alcohol   Drug  use: No   Sexual activity: Not Currently  Other Topics Concern   Not on file  Social History Narrative   Patient is disabled and lives at home with his sister. He is very active and walks or bicycles around town. He does this daily and probably averages about 50 miles a week on his bike. He is not married and does not have any children.    Social Determinants of Health   Financial Resource Strain: Low Risk  (03/20/2022)   Overall Financial Resource Strain (CARDIA)    Difficulty of Paying Living Expenses: Not hard at all  Food Insecurity: No Food Insecurity (03/20/2022)   Hunger Vital Sign    Worried About Running Out of Food in the Last Year: Never true    Ran Out of Food in the Last Year: Never true  Transportation Needs: No Transportation Needs (03/20/2022)   PRAPARE - Hydrologist (Medical): No    Lack of Transportation (Non-Medical): No  Physical Activity: Sufficiently Active (03/20/2022)   Exercise Vital Sign    Days of Exercise per Week: 5 days    Minutes of Exercise per Session: 30 min  Stress: No Stress Concern Present (03/20/2022)   [place]    Feeling of Stress : Not at all  Social Connections: Moderately Isolated (03/20/2022)   Social Connection and Isolation Panel [NHANES]    Frequency of Communication with Friends and Family: More than three times a week    Frequency of Social Gatherings with Friends and Family: Three times a week    Attends Religious Services: More than 4 times per year    Active Member of Clubs or Organizations: No    Attends Archivist Meetings: Never    Marital Status: Never married    Tobacco Counseling Counseling given: Not Answered   Clinical Intake:  Pre-visit preparation completed: Yes  Pain : No/denies pain  Diabetes: Yes CBG done?: No Did pt. bring in CBG monitor from home?: No  How often do you need to have someone help  you when you read instructions, pamphlets, or other written materials from your doctor or pharmacy?: 1 - Never  Diabetic?Yes  Nutrition Risk Assessment:  Has the patient had any N/V/D within the last 2 months?  No  Does the patient have any non-healing wounds?  No  Has the patient had any unintentional weight loss or weight gain?  No   Diabetes:  Is the patient diabetic?  Yes  If diabetic, was a CBG obtained today?  No  Did the patient bring in their glucometer from home?  No  How often do you monitor your CBG's? Does not monitor .   Financial Strains and Diabetes Management:  Are you having any financial strains with the device, your supplies or your medication? No .  Does the patient want to be seen by Chronic Care Management for management of their diabetes?  No  Would the patient like to be referred to a Nutritionist or for Diabetic Management?  No   Diabetic Exams:  Diabetic Eye Exam: Completed 07/04/21 Diabetic Foot Exam: Completed 07/02/21    Interpreter Needed?: No  Comments: Assisted with visit by sister- Eric Stuart Information entered by :: Denman George LPN   Activities of Daily Living    03/20/2022    9:21 AM  In your present state of health, do you have any difficulty performing the following activities:  Hearing? 1  Vision? 0  Difficulty concentrating or making decisions? 1  Walking or climbing stairs? 1  Dressing or bathing? 0  Doing errands, shopping? 1  Preparing Food and eating ? N  Using the Toilet? N  In the past six months, have you accidently leaked urine? N  Do you have problems with loss of bowel control? N  Managing your Medications? Y  Managing your Finances? Y  Housekeeping or managing your Housekeeping? Y    Patient Care Team: [name], FNP as PCP - General (Nurse Practitioner) Melina Schools, OD (Optometry)  Indicate any recent Medical Services you may have received from other than [facility] providers in the past year (date may be  approximate).     Assessment:   This is a routine wellness examination for Eric Stuart.  Hearing/Vision screen Hearing Screening - Comments:: Hearing problems primarily with left ear- declines hearing aids  Vision Screening - Comments:: Wears rx glasses - up to date with [facility]     Dietary issues and exercise activities discussed: Current Exercise Habits: The patient does not participate in regular exercise at present;Home exercise routine, Type of exercise: walking, Time (Minutes): 30, Frequency (Times/Week): 5, Weekly Exercise (Minutes/Week): 150, Intensity: Mild   Goals Addressed             This Visit's Progress    Prevent falls       Increased problems with instability       Depression Screen    03/20/2022    9:19 AM 01/07/2022    8:41 AM 03/19/2021    8:31 AM 02/16/2021    4:35 PM 12/25/2020    2:15 PM 10/11/2020    1:25 PM 08/29/2020   12:15 PM  PHQ 2/9 Scores  PHQ - 2 Score 0 0 1 2 0 2 2  PHQ- 9 Score   _0 Fall Risk    03/20/2022    8:58 AM 01/07/2022    8:41 AM 03/19/2021    8:32 AM 03/16/2020    8:42 AM 11/04/2019   11:59 AM  Fall Risk   Falls in the past year? 0 0 0 0 0  Number falls in past yr: 0  0    Injury with Fall? 0  0    Risk for fall due to : Impaired balance/gait;Mental status change  No Fall Risks    Follow up Falls evaluation completed;Education provided;Falls prevention discussed  Falls prevention discussed      FALL RISK PREVENTION PERTAINING TO THE HOME:  Any stairs in or around the home? No  If so, are there any without  handrails? No  Home free of loose throw rugs in walkways, pet beds, electrical cords, etc? Yes  Adequate lighting in your home to reduce risk of falls? Yes   ASSISTIVE DEVICES UTILIZED TO PREVENT FALLS:  Life alert? No  Use of a cane, walker or w/c? No  Grab bars in the bathroom? Yes  Shower chair or bench in shower? No  Elevated toilet seat or a handicapped toilet? Yes    TIMED UP AND GO:  Was the test performed? No . Telephonic visit   Cognitive Function:    11/04/2019   12:00 PM 02/13/2018    3:31 PM 01/30/2017    3:50 PM  MMSE - Mini Mental State Exam  Not completed:  Unable to complete Unable to complete  Orientation to time 2    Orientation to Place 2    Registration 3    Attention/ Calculation 0    Recall 3    Language- name 2 objects 2    Language- repeat 0    Language- follow 3 step command 3    Language- read & follow direction 0    Write a sentence 0    Copy design 0    Total score 15          03/20/2022    9:22 AM  6CIT Screen  What Year? 0 points  What month? 0 points  What time? 0 points  Count back from 20 4 points  Months in reverse 4 points  Repeat phrase 4 points  Total Score 12 points    Immunizations Immunization History  Administered Date(s) Administered   Fluad Quad(high Dose 65+) 01/07/2022   Influenza Inj Mdck Quad Pf 12/15/2017   Influenza Split 02/26/2016, 01/02/2017   Influenza,inj,Quad PF,6+ Mos 03/01/2013, 12/29/2013, 04/05/2019, 06/01/2020, 12/25/2020   Influenza-Unspecified 12/15/2017   Moderna Sars-Covid-2 Vaccination 06/17/2019, 07/16/2019, 02/19/2020   Pneumococcal Conjugate-13 04/26/2014   Pneumococcal Polysaccharide-23 02/13/2018   Td 03/01/2013   Td (Adult), 2 Lf Tetanus Toxid, Preservative Free 03/01/2013   Tdap 03/01/2013   Zoster Recombinat (Shingrix) 07/02/2021   Zoster, Live 05/04/2012    TDAP status: Up to date  Flu Vaccine status: Up to date  Pneumococcal vaccine status: Up to date  Covid-19 vaccine status: Information provided on how to obtain vaccines.   Qualifies for Shingles Vaccine? Yes   Zostavax completed Yes   Shingrix Completed?: No.    Education has been provided regarding the importance of this vaccine. Patient has been advised to call insurance company to determine out of pocket expense if they have not yet received this vaccine. Advised may also receive vaccine  at local pharmacy or Health Dept. Verbalized acceptance and understanding.  Screening Tests Health Maintenance  Topic Date Due   COVID-19 Vaccine (4 - 2023-24 season) 11/30/2021   Zoster Vaccines- Shingrix (2 of 2) 04/09/2022 (Originally 08/27/2021)   FOOT EXAM  07/03/2022   OPHTHALMOLOGY EXAM  07/05/2022   HEMOGLOBIN A1C  07/09/2022   Diabetic kidney evaluation - Urine ACR  12/14/2022   Diabetic kidney evaluation - eGFR measurement  01/22/2023   Pneumonia Vaccine 37+ Years old (3 - PPSV23 or PCV20) 02/14/2023   DTaP/Tdap/Td (4 - Td or Tdap) 03/02/2023   Medicare Annual Wellness (AWV)  03/21/2023   COLONOSCOPY (Pts 45-39yr Insurance coverage will need to be confirmed)  09/14/2027   INFLUENZA VACCINE  Completed   Hepatitis C Screening  Completed   HIV Screening  Completed   HPV VACCINES  Aged Out  Health Maintenance  Health Maintenance Due  Topic Date Due   COVID-19 Vaccine (4 - 2023-24 season) 11/30/2021    Colorectal cancer screening: Type of screening: Colonoscopy. Completed 09/13/20. Repeat every 7 years  Lung Cancer Screening: (Low Dose CT Chest recommended if Age 81-80 years, 30 pack-year currently smoking OR have quit w/in 15years.) does not qualify.   Lung Cancer Screening Referral: n/a   Additional Screening:  Hepatitis C Screening: does qualify; Completed 07/24/15  Vision Screening: Recommended annual ophthalmology exams for early detection of glaucoma and other disorders of the eye. Is the patient up to date with their annual eye exam?  Yes  Who is the provider or what is the name of the office in which the patient attends annual eye exams? [facility]  If pt is not established with a provider, would they like to be referred to a provider to establish care? No .   Dental Screening: Recommended annual dental exams for proper oral hygiene  Community Resource Referral / Chronic Care Management: CRR required this visit?  No   CCM required this visit?  No       Plan:     I have personally reviewed and noted the following in the patient's chart:   Medical and social history Use of alcohol, tobacco or illicit drugs  Current medications and supplements including opioid prescriptions. Patient is not currently taking opioid prescriptions. Functional ability and status Nutritional status Physical activity Advanced directives List of other physicians Hospitalizations, surgeries, and ER visits in previous 12 months Vitals Screenings to include cognitive, depression, and falls Referrals and appointments  In addition, I have reviewed and discussed with patient certain preventive protocols, quality metrics, and best practice recommendations. A written personalized care plan for preventive services as well as general preventive health recommendations were provided to patient.     Vanetta Mulders, [place]   [id number]   Due to this being a virtual visit, the after visit summary with patients personalized plan was offered to patient via mail or my-chart. per request, patient was mailed a copy of AVS.  Nurse Notes: Sister reports that patients gait is becoming more unstable.  She would like to hold on a therapy referral at this time.  She also reports that patient talks much less than he did previously.  He does not have any aggressive behaviors.

## 2022-05-29 ENCOUNTER — Encounter: Payer: Self-pay | Admitting: Physician Assistant

## 2022-06-17 ENCOUNTER — Other Ambulatory Visit (INDEPENDENT_AMBULATORY_CARE_PROVIDER_SITE_OTHER): Payer: Medicare HMO

## 2022-06-17 ENCOUNTER — Encounter: Payer: Self-pay | Admitting: Physician Assistant

## 2022-06-17 ENCOUNTER — Ambulatory Visit (INDEPENDENT_AMBULATORY_CARE_PROVIDER_SITE_OTHER): Payer: Medicare HMO | Admitting: Physician Assistant

## 2022-06-17 VITALS — BP 148/92 | HR 83 | Ht 65.0 in | Wt 127.0 lb

## 2022-06-17 DIAGNOSIS — R413 Other amnesia: Secondary | ICD-10-CM

## 2022-06-17 DIAGNOSIS — G309 Alzheimer's disease, unspecified: Secondary | ICD-10-CM

## 2022-06-17 DIAGNOSIS — Q860 Fetal alcohol syndrome (dysmorphic): Secondary | ICD-10-CM | POA: Diagnosis not present

## 2022-06-17 DIAGNOSIS — R69 Illness, unspecified: Secondary | ICD-10-CM | POA: Diagnosis not present

## 2022-06-17 DIAGNOSIS — Z550 Illiteracy and low-level literacy: Secondary | ICD-10-CM

## 2022-06-17 DIAGNOSIS — F0280 Dementia in other diseases classified elsewhere without behavioral disturbance: Secondary | ICD-10-CM

## 2022-06-17 DIAGNOSIS — F70 Mild intellectual disabilities: Secondary | ICD-10-CM

## 2022-06-17 LAB — VITAMIN B12: Vitamin B-12: 402 pg/mL (ref 211–911)

## 2022-06-17 NOTE — Patient Instructions (Signed)
It was a pleasure to see you today at our office.   Recommendations:  Follow up in  6 months Continue donepezil 10 mg daily. Side effects were discussed  Continue Memantine 10 mg twice daily.Side effects were discussed  MRI brain  Check labs today    Whom to call:  Memory  decline, memory medications: Call our office [phone]   For psychiatric meds, mood meds: Please have your primary care physician manage these medications.   Counseling regarding caregiver distress, including caregiver depression, anxiety and issues regarding community resources, adult day care programs, adult living facilities, or memory care questions:   Feel free to contact Robinson, Social Worker at [phone]   For assessment of decision of mental capacity and competency:  Call Dr. Anthoney Harada, geriatric psychiatrist at [phone]  For guidance in geriatric dementia issues please call [facility] [phone]  For guidance regarding [facility] and if placement were needed at the facility, contact Arnell Asal, Social Worker tel: [phone]  If you have any severe symptoms of a stroke, or other severe issues such as confusion,severe chills or fever, etc call 911 or go to the ER as you may need to be evaluated further   Feel free to visit Facebook page " [employer]" for tips of how to care for people with memory problems.   Feel free to go to the following database for funded clinical studies conducted around the world: http://saunders.com/   https://www.triadclinicaltrials.com/     RECOMMENDATIONS FOR ALL PATIENTS WITH MEMORY PROBLEMS: 1. Continue to exercise (Recommend 30 minutes of walking everyday, or 3 hours every week) 2. Increase social interactions - continue going to [place] and enjoy social gatherings with friends and family 3. Eat healthy, avoid fried foods and eat more fruits and vegetables 4. Maintain adequate blood pressure, blood  sugar, and blood cholesterol level. Reducing the risk of stroke and cardiovascular disease also helps promoting better memory. 5. Avoid stressful situations. Live a simple life and avoid aggravations. Organize your time and prepare for the next day in anticipation. 6. Sleep well, avoid any interruptions of sleep and avoid any distractions in the bedroom that may interfere with adequate sleep quality 7. Avoid sugar, avoid sweets as there is a strong link between excessive sugar intake, diabetes, and cognitive impairment We discussed the Mediterranean diet, which has been shown to help patients reduce the risk of progressive memory disorders and reduces cardiovascular risk. This includes eating fish, eat fruits and green leafy vegetables, nuts like almonds and hazelnuts, walnuts, and also use olive oil. Avoid fast foods and fried foods as much as possible. Avoid sweets and sugar as sugar use has been linked to worsening of memory function.  There is always a concern of gradual progression of memory problems. If this is the case, then we may need to adjust level of care according to patient needs. Support, both to the patient and caregiver, should then be put into place.    The Alzheimer's Association is here all day, every day for people facing Alzheimer's disease through our free 24/7 Helpline: [phone]. The Helpline provides reliable information and support to all those who need assistance, such as individuals living with memory loss, Alzheimer's or other dementia, caregivers, health care professionals and the public.  Our highly trained and knowledgeable staff can help you with: Understanding memory loss, dementia and Alzheimer's  Medications and other treatment options  General information about aging and brain health  Skills to provide quality care  and to find the best care from professionals  Legal, financial and living-arrangement decisions Our Helpline also features: Confidential care  consultation provided by master's level clinicians who can help with decision-making support, crisis assistance and education on issues families face every day  Help in a caller's preferred language using our translation service that features more than 200 languages and dialects  Referrals to local community programs, services and ongoing support     FALL PRECAUTIONS: Be cautious when walking. Scan the area for obstacles that may increase the risk of trips and falls. When getting up in the mornings, sit up at the edge of the bed for a few minutes before getting out of bed. Consider elevating the bed at the head end to avoid drop of blood pressure when getting up. Walk always in a well-lit room (use night lights in the walls). Avoid area rugs or power cords from appliances in the middle of the walkways. Use a walker or a cane if necessary and consider physical therapy for balance exercise. Get your eyesight checked regularly.  FINANCIAL OVERSIGHT: Supervision, especially oversight when making financial decisions or transactions is also recommended.  HOME SAFETY: Consider the safety of the kitchen when operating appliances like stoves, microwave oven, and blender. Consider having supervision and share cooking responsibilities until no longer able to participate in those. Accidents with firearms and other hazards in the house should be identified and addressed as well.   ABILITY TO BE LEFT ALONE: If patient is unable to contact 911 operator, consider using LifeLine, or when the need is there, arrange for someone to stay with patients. Smoking is a fire hazard, consider supervision or cessation. Risk of wandering should be assessed by caregiver and if detected at any point, supervision and safe proof recommendations should be instituted.  MEDICATION SUPERVISION: Inability to self-administer medication needs to be constantly addressed. Implement a mechanism to ensure safe administration of the  medications.   DRIVING: Regarding driving, in patients with progressive memory problems, driving will be impaired. We advise to have someone else do the driving if trouble finding directions or if minor accidents are reported. Independent driving assessment is available to determine safety of driving.   If you are interested in the driving assessment, you can contact the following:  [employer] in [place]  [place] [phone] or [phone]      [place] refers to food and lifestyle choices that are based on the traditions of countries located on the [employer]. This way of eating has been shown to help prevent certain conditions and improve outcomes for people who have chronic diseases, like kidney disease and heart disease. What are tips for following this plan? Lifestyle  Cook and eat meals together with your family, when possible. Drink enough fluid to keep your urine clear or pale yellow. Be physically active every day. This includes: Aerobic exercise like running or swimming. Leisure activities like gardening, walking, or housework. Get 7-8 hours of sleep each night. If recommended by your health care provider, drink red wine in moderation. This means 1 glass a day for nonpregnant women and 2 glasses a day for men. A glass of wine equals 5 oz (150 mL). Reading food labels  Check the serving size of packaged foods. For foods such as rice and pasta, the serving size refers to the amount of cooked product, not dry. Check the total fat in packaged  foods. Avoid foods that have saturated fat or trans fats. Check the ingredients list for added sugars, such as corn syrup. Shopping  At the grocery store, buy most of your food from the areas near the walls of the store. This includes: Fresh fruits and vegetables  (produce). Grains, beans, nuts, and seeds. Some of these may be available in unpackaged forms or large amounts (in bulk). Fresh seafood. Poultry and eggs. Low-fat dairy products. Buy whole ingredients instead of prepackaged foods. Buy fresh fruits and vegetables in-season from local farmers markets. Buy frozen fruits and vegetables in resealable bags. If you do not have access to quality fresh seafood, buy precooked frozen shrimp or canned fish, such as tuna, salmon, or sardines. Buy small amounts of raw or cooked vegetables, salads, or olives from the deli or salad bar at your store. Stock your pantry so you always have certain foods on hand, such as olive oil, canned tuna, canned tomatoes, rice, pasta, and beans. Cooking  Cook foods with extra-virgin olive oil instead of using butter or other vegetable oils. Have meat as a side dish, and have vegetables or grains as your main dish. This means having meat in small portions or adding small amounts of meat to foods like pasta or stew. Use beans or vegetables instead of meat in common dishes like chili or lasagna. Experiment with different cooking methods. Try roasting or broiling vegetables instead of steaming or sauteing them. Add frozen vegetables to soups, stews, pasta, or rice. Add nuts or seeds for added healthy fat at each meal. You can add these to yogurt, salads, or vegetable dishes. Marinate fish or vegetables using olive oil, lemon juice, garlic, and fresh herbs. Meal planning  Plan to eat 1 vegetarian meal one day each week. Try to work up to 2 vegetarian meals, if possible. Eat seafood 2 or more times a week. Have healthy snacks readily available, such as: Vegetable sticks with hummus. Greek yogurt. Fruit and nut trail mix. Eat balanced meals throughout the week. This includes: Fruit: 2-3 servings a day Vegetables: 4-5 servings a day Low-fat dairy: 2 servings a day Fish, poultry, or lean meat: 1 serving a day Beans and  legumes: 2 or more servings a week Nuts and seeds: 1-2 servings a day Whole grains: 6-8 servings a day Extra-virgin olive oil: 3-4 servings a day Limit red meat and sweets to only a few servings a month What are my food choices? Mediterranean diet Recommended Grains: Whole-grain pasta. Brown rice. Bulgar wheat. Polenta. Couscous. Whole-wheat bread. [place]. Vegetables: Artichokes. Beets. Broccoli. Cabbage. Carrots. Eggplant. Green beans. Chard. Kale. Spinach. Onions. Leeks. Peas. Squash. Tomatoes. Peppers. Radishes. Fruits: Apples. Apricots. Avocado. Berries. Bananas. Cherries. Dates. Figs. Grapes. Lemons. Melon. Oranges. Peaches. Plums. Pomegranate. Meats and other protein foods: Beans. Almonds. Sunflower seeds. Pine nuts. Peanuts. [place]. Salmon. Scallops. Shrimp. [place]. Tilapia. Clams. Oysters. Eggs. Dairy: Low-fat milk. Cheese. Greek yogurt. Beverages: Water. Red wine. Herbal tea. Fats and oils: Extra virgin olive oil. Avocado oil. Grape seed oil. Sweets and desserts: [place] yogurt with honey. Baked apples. Poached pears. Trail mix. Seasoning and other foods: Basil. Cilantro. Coriander. Cumin. Mint. Parsley. Sage. Rosemary. Tarragon. Garlic. Oregano. Thyme. Pepper. Balsalmic vinegar. Tahini. Hummus. Tomato sauce. Olives. Mushrooms. Limit these Grains: Prepackaged pasta or rice dishes. Prepackaged cereal with added sugar. Vegetables: Deep fried potatoes (french fries). Fruits: Fruit canned in syrup. Meats and other protein foods: Beef. Pork. Lamb. Poultry with skin. Hot dogs. [place] Salines. Dairy: Ice cream. Sour cream. Whole milk. Beverages: Juice.  Sugar-sweetened soft drinks. Beer. Liquor and spirits. Fats and oils: Butter. Canola oil. Vegetable oil. Beef fat (tallow). Lard. Sweets and desserts: Cookies. Cakes. Pies. Candy. Seasoning and other foods: Mayonnaise. Premade sauces and marinades. The items listed may not be a complete list. Talk with your dietitian about what dietary choices  are right for you. Summary The Mediterranean diet includes both food and lifestyle choices. Eat a variety of fresh fruits and vegetables, beans, nuts, seeds, and whole grains. Limit the amount of red meat and sweets that you eat. Talk with your health care provider about whether it is safe for you to drink red wine in moderation. This means 1 glass a day for nonpregnant women and 2 glasses a day for men. A glass of wine equals 5 oz (150 mL). This information is not intended to replace advice given to you by your health care provider. Make sure you discuss any questions you have with your health care provider. Document Released: 11/09/2015 Document Revised: 12/12/2015 Document Reviewed: 11/09/2015 Elsevier Interactive Patient Education  2017 [employer].

## 2022-06-17 NOTE — Progress Notes (Signed)
Assessment/Plan:    The patient is seen in neurologic consultation at the request of [name], FNP for the evaluation of memory.  Eric Stuart is a very pleasant 67 y.o. year old [place] male with a history of hypertension, hyperlipidemia, HOH, CKD,  DM2,  developmental delay due to alcoholic syndrome", sarcoidosis, bradycardia in the past, anemia, seen today for continuation of care.  He carries a diagnosis of dementia likely due to Alzheimer's disease, but as his neurologist's office closed he is here for further evaluation.  MoCA today is 11/28 (the patient does not read or write).  Overall, the patient appears to be stable from the cognitive standpoint when compared to notes from 2021, and his sister agrees.  He still able to perform some of the ADLs.  He does not drive.   Dementia likely due to Alzheimer's disease History of developmental delay  MRI brain without contrast to assess for underlying structural abnormality and assess vascular load  Check B12, TSH Continue donepezil 10 mg daily. Side effects were discussed  Continue Memantine 10 mg twice daily. Side effects were discussed  Folllow up in 6 months  Subjective:    The patient is accompanied by his sister   who supplements the history.    How long did patient have memory difficulties?  Patient already carries a diagnosis of dementia likely due to Alzheimer disease per other neurologist.  Over the last year, they memory remains about the same according to his sister.  He may have   some difficulty remembering recent conversations and people names, long term memory is good. "He says he does memory games and word search but, I don't know" repeats oneself?  Denies . "He does not talk much , he never does " Disoriented when walking into a room?  Patient denies  Leaving objects in unusual places? Leaves stuff on the table and does not replace it   Wandering behavior? denies  She has a doorbell camera just in case Any  personality changes since last visit? denies   Any history of depression?:  denies   Hallucinations or paranoia?  denies  "but he has a habit of talking to himself "-always did, sister says  Seizures? denies    Any sleep changes?  Endorsed  Denies vivid dreams, REM behavior or sleepwalking   Sleep apnea? denies   Any hygiene concerns?  denies   Independent of bathing and dressing?   denies  Does the patient need help with medications?  Sister is in charge   Who is in charge of the finances? Sister  is in charge     Any changes in appetite?  He eats well     Patient have trouble swallowing?  denies   Does the patient cook?  No   Any headaches?  denies   Chronic back pain?  denies   Ambulates with difficulty?  staggers a little Recent falls or head injuries? denies     Vision changes? Denies Unilateral weakness, numbness or tingling?  denies   Any tremors? "Not always. Any anosmia?  denies   Any incontinence of urine? denies   Any bowel dysfunction?    denies      Patient lives  with sister for the last 25 years   History of heavy alcohol intake? denies   History of heavy tobacco use? denies   Family history of dementia? " Not that I know of"-sister says Dose patient drive? Never drove   MRI of the  brain, personally reviewed, 01/27/2020 was remarkable for mild cerebral atrophy and chronic small vessel ischemic changes without acute findings  No Known Allergies  Current Outpatient Medications  Medication Instructions   amLODipine (NORVASC) 10 mg, Oral, Daily   diclofenac (VOLTAREN) 75 mg, Oral, 2 times daily   donepezil (ARICEPT) 10 mg, Oral, Daily at bedtime   memantine (NAMENDA) 10 mg, Oral, 2 times daily   metFORMIN (GLUCOPHAGE) 500 mg, Oral, 2 times daily with meals   olmesartan-hydrochlorothiazide (BENICAR HCT) 40-25 MG tablet 1 tablet, Oral, Daily   rosuvastatin (CRESTOR) 10 mg, Oral, Every evening, Needs to be seen for further refills.     VITALS:   Vitals:    06/17/22 1312 06/17/22 1414  BP: (!) 163/91 (!) 148/92  Pulse: 83   SpO2: 98%   Weight: 127 lb (57.6 kg)   Height: 5\' 5"  (1.651 m)     PHYSICAL EXAM   HEENT:  Normocephalic, atraumatic. The mucous membranes are moist. The superficial temporal arteries are without ropiness or tenderness. Cardiovascular: Regular rate and rhythm. Lungs: Clear to auscultation bilaterally. Neck: There are no carotid bruits noted bilaterally.  NEUROLOGICAL:     No data to display             06/17/2022    2:00 PM 11/04/2019   12:00 PM 02/13/2018    3:31 PM  MMSE - Mini Mental State Exam  Not completed: Unable to complete  Unable to complete  Orientation to time 1 2   Orientation to Place 1 2   Registration 2 3   Attention/ Calculation 0 0   Recall 2 3   Language- name 2 objects 2 2   Language- repeat 1 0   Language- follow 3 step command 2 3   Language- read & follow direction 0 0   Write a sentence 0 0   Copy design 0 0   Total score 11 15      Orientation:  Alert and oriented to person, not to place or time. No aphasia or dysarthria, but his speech is slow. Fund of knowledge is reduced.  Recent and remote memory impaired.  Attention and concentration are decreased.  Able to name objects and unable to repeat phrases. Delayed recall 2/3 Cranial nerves: There is good facial symmetry. Extraocular muscles are intact and visual fields are full to confrontational testing. Speech is slow, not very fluent but clear. no tongue deviation. Hearing is mildly decreased to conversational tone.  Tone: Tone is good throughout. Sensation: Sensation is intact to light touch and pinprick throughout. Vibration is intact at the bilateral big toe.There is no extinction with double simultaneous stimulation. There is no sensory dermatomal level identified. Coordination: The patient has no difficulty with RAM's or FNF bilaterally. Normal finger to nose  Motor: Strength is 5/5 in the bilateral upper and lower  extremities. There is no pronator drift. There are no fasciculations noted. DTR's: Deep tendon reflexes are 2/4 at the bilateral biceps, triceps, brachioradialis, patella and achilles.  Plantar responses are downgoing bilaterally. Gait and Station: The patient is able to ambulate without difficulty.The patient is able to ambulate in a tandem fashion, able to stand in the Romberg position.     Thank you for allowing [place] the opportunity to participate in the care of this nice patient. Please do not hesitate to contact us for any questions or concerns.   Total time spent on today's visit was 47 minutes dedicated to this patient today, preparing to see patient, examining  the patient, ordering tests and/or medications and counseling the patient, documenting clinical information in the EHR or other health record, independently interpreting results and communicating results to the patient/family, discussing treatment and goals, answering patient's questions and coordinating care.  Cc:  [name], FNP  Sharene Butters 06/17/2022 2:18 PM

## 2022-06-18 LAB — TSH: TSH: 1.84 mIU/L (ref 0.40–4.50)

## 2022-06-18 NOTE — Progress Notes (Signed)
B12 is on the lower normal, recommend B12 1000 mcg daily and follow with primary doctor. Thyroid is normal thanks

## 2022-06-25 ENCOUNTER — Ambulatory Visit: Payer: Medicare HMO | Admitting: Physician Assistant

## 2022-07-01 ENCOUNTER — Ambulatory Visit: Payer: Medicaid Other | Admitting: Physician Assistant

## 2022-07-02 ENCOUNTER — Encounter: Payer: Self-pay | Admitting: Neurology

## 2022-07-10 ENCOUNTER — Ambulatory Visit
Admission: RE | Admit: 2022-07-10 | Discharge: 2022-07-10 | Disposition: A | Discharge status: Home / Self Care | Payer: Medicare HMO | Source: Ambulatory Visit | Attending: Physician Assistant | Admitting: Physician Assistant

## 2022-07-10 DIAGNOSIS — R413 Other amnesia: Secondary | ICD-10-CM

## 2022-07-10 DIAGNOSIS — G319 Degenerative disease of nervous system, unspecified: Secondary | ICD-10-CM | POA: Diagnosis not present

## 2022-07-10 DIAGNOSIS — I6782 Cerebral ischemia: Secondary | ICD-10-CM | POA: Diagnosis not present

## 2022-07-11 ENCOUNTER — Telehealth: Payer: Self-pay

## 2022-07-11 DIAGNOSIS — M4802 Spinal stenosis, cervical region: Secondary | ICD-10-CM

## 2022-07-11 NOTE — Telephone Encounter (Signed)
-----   Message from Antony Madura, MD sent at 07/10/2022  3:02 PM EDT ----- Can we let patient know that MRI brain is similar to prior without significant findings? There is mention of possible tightening around the spinal cord in the neck (cervical spine), so an MRI cervical spine was recommended. If patient agrees, can we order MRI cervical spine wo contrast?   Thank you,  Jacquelyne Balint, MD

## 2022-07-12 ENCOUNTER — Other Ambulatory Visit: Payer: Self-pay | Admitting: Family

## 2022-07-12 DIAGNOSIS — E1169 Type 2 diabetes mellitus with other specified complication: Secondary | ICD-10-CM

## 2022-07-12 DIAGNOSIS — I152 Hypertension secondary to endocrine disorders: Secondary | ICD-10-CM

## 2022-08-12 ENCOUNTER — Other Ambulatory Visit: Payer: Self-pay | Admitting: Family

## 2022-08-12 ENCOUNTER — Ambulatory Visit
Admission: RE | Admit: 2022-08-12 | Discharge: 2022-08-12 | Disposition: A | Discharge status: Home / Self Care | Payer: Medicare HMO | Source: Ambulatory Visit | Attending: Neurology | Admitting: Neurology

## 2022-08-12 ENCOUNTER — Telehealth: Payer: Self-pay | Admitting: Family

## 2022-08-12 DIAGNOSIS — I152 Hypertension secondary to endocrine disorders: Secondary | ICD-10-CM

## 2022-08-12 DIAGNOSIS — M542 Cervicalgia: Secondary | ICD-10-CM | POA: Diagnosis not present

## 2022-08-12 DIAGNOSIS — E1169 Type 2 diabetes mellitus with other specified complication: Secondary | ICD-10-CM

## 2022-08-12 DIAGNOSIS — M4802 Spinal stenosis, cervical region: Secondary | ICD-10-CM | POA: Diagnosis not present

## 2022-08-12 NOTE — Telephone Encounter (Signed)
Refills sent in via electronic request

## 2022-08-12 NOTE — Telephone Encounter (Signed)
[facility] pt NTBS 30-d given 07/12/22

## 2022-08-12 NOTE — Telephone Encounter (Signed)
Pt made appt on 06/10

## 2022-08-17 ENCOUNTER — Telehealth: Payer: Self-pay | Admitting: Neurology

## 2022-08-17 NOTE — Telephone Encounter (Signed)
Spoke with patient's POA, [employer]. I explained the results of the MRI cervical spine and that is showed severe spinal stenosis, which could be a neurologic emergency needing immediate medical attention. Malachi Bonds was confident that patient was having no new deficits. He is moving all extremities normal, not falling. She stated she would keep a close eye on Mr. Redeker and take him to emergency room if he displayed any deficits.   I told her that our office would be in touch on Monday with next steps. All questions were answered.  Jacquelyne Balint, MD [facility]

## 2022-08-17 NOTE — Telephone Encounter (Signed)
I received a call from on-call nurse on 08/17/22 ~1330. Radiology had called with critical results from patient's MRI cervical spine which showed severe cervical stenosis. I attempted to call patient x4 at both numbers listed, but got answering machine/voicemail each time. I left a message on cell asking for a call back so that I could relay results and discuss if patient had symptoms, as he could need emergent evaluation.  Jacquelyne Balint, MD [facility]

## 2022-08-19 ENCOUNTER — Telehealth: Payer: Self-pay | Admitting: Physician Assistant

## 2022-08-19 DIAGNOSIS — M4802 Spinal stenosis, cervical region: Secondary | ICD-10-CM

## 2022-08-19 NOTE — Telephone Encounter (Signed)
Critical lab report  Stacy with [facility] radiology called AN. MRI of cervical spine shows severe spinal canal stenosis of C4 and C5 with severe impingment of spinal cord, likely myelomalacia. MRI was done on 08/12/22

## 2022-08-21 ENCOUNTER — Other Ambulatory Visit: Payer: Self-pay | Admitting: *Deleted

## 2022-08-21 DIAGNOSIS — G992 Myelopathy in diseases classified elsewhere: Secondary | ICD-10-CM | POA: Diagnosis not present

## 2022-08-21 DIAGNOSIS — M4802 Spinal stenosis, cervical region: Secondary | ICD-10-CM | POA: Diagnosis not present

## 2022-08-21 DIAGNOSIS — E1159 Type 2 diabetes mellitus with other circulatory complications: Secondary | ICD-10-CM

## 2022-08-21 MED ORDER — OLMESARTAN MEDOXOMIL-HCTZ 40-25 MG PO TABS
1.0000 | ORAL_TABLET | Freq: Every day | ORAL | 0 refills | Status: DC
Start: 2022-08-21 — End: 2023-01-06

## 2022-08-23 ENCOUNTER — Other Ambulatory Visit: Payer: Self-pay | Admitting: Neurosurgery

## 2022-08-28 NOTE — Pre-Procedure Instructions (Signed)
Surgical Instructions    Your procedure is scheduled on September 03, 2022.  Report to [facility] Main Entrance "A" at 9:10 A.M., then check in with the Admitting office.  Call this number if you have problems the morning of surgery:  [phone]  If you have any questions prior to your surgery date call [phone]: Open Monday-Friday 8am-4pm If you experience any cold or flu symptoms such as cough, fever, chills, shortness of breath, etc. between now and your scheduled surgery, please notify us at the above number.     Remember:  Do not eat or drink after midnight the night before your surgery     Take these medicines the morning of surgery with A SIP OF WATER:  amLODipine (NORVASC)   memantine (NAMENDA)   rosuvastatin (CRESTOR)    As of today, STOP taking any Aspirin (unless otherwise instructed by your surgeon) Aleve, Naproxen, Ibuprofen, Motrin, Advil, Goody's, BC's, all herbal medications, fish oil, and all vitamins.   WHAT DO I DO ABOUT MY DIABETES MEDICATION?   Do not take metFORMIN (GLUCOPHAGE) the morning of surgery.   HOW TO MANAGE YOUR DIABETES BEFORE AND AFTER SURGERY  Why is it important to control my blood sugar before and after surgery? Improving blood sugar levels before and after surgery helps healing and can limit problems. A way of improving blood sugar control is eating a healthy diet by:  Eating less sugar and carbohydrates  Increasing activity/exercise  Talking with your doctor about reaching your blood sugar goals High blood sugars (greater than 180 mg/dL) can raise your risk of infections and slow your recovery, so you will need to focus on controlling your diabetes during the weeks before surgery. Make sure that the doctor who takes care of your diabetes knows about your planned surgery including the date and location.  How do I manage my blood sugar before surgery? Check your blood sugar at least 4 times a day, starting 2 days before surgery, to make  sure that the level is not too high or low.  Check your blood sugar the morning of your surgery when you wake up and every 2 hours until you get to the Short Stay unit.  If your blood sugar is less than 70 mg/dL, you will need to treat for low blood sugar: Do not take insulin. Treat a low blood sugar (less than 70 mg/dL) with  cup of clear juice (cranberry or apple), 4 glucose tablets, OR glucose gel. Recheck blood sugar in 15 minutes after treatment (to make sure it is greater than 70 mg/dL). If your blood sugar is not greater than 70 mg/dL on recheck, call [phone] for further instructions. Report your blood sugar to the short stay nurse when you get to Short Stay.  If you are admitted to the hospital after surgery: Your blood sugar will be checked by the staff and you will probably be given insulin after surgery (instead of oral diabetes medicines) to make sure you have good blood sugar levels. The goal for blood sugar control after surgery is 80-180 mg/dL.                      Do NOT Smoke (Tobacco/Vaping) for 24 hours prior to your procedure.  If you use a CPAP at night, you may bring your mask/headgear for your overnight stay.   Contacts, glasses, piercing's, hearing aid's, dentures or partials may not be worn into surgery, please bring cases for these belongings.  For patients admitted to the hospital, discharge time will be determined by your treatment team.   Patients discharged the day of surgery will not be allowed to drive home, and someone needs to stay with them for 24 hours.  SURGICAL WAITING ROOM VISITATION Patients having surgery or a procedure may have no more than 2 support people in the waiting area - these visitors may rotate.   Children under the age of 41 must have an adult with them who is not the patient. If the patient needs to stay at the hospital during part of their recovery, the visitor guidelines for inpatient rooms apply. Pre-op nurse will  coordinate an appropriate time for 1 support person to accompany patient in pre-op.  This support person may not rotate.   Please refer to the [facility] website for the visitor guidelines for Inpatients (after your surgery is over and you are in a regular room).   If you received a COVID test during your pre-op visit  it is requested that you wear a mask when out in public, stay away from anyone that may not be feeling well and notify your surgeon if you develop symptoms. If you have been in contact with anyone that has tested positive in the last 10 days please notify you surgeon.     Pre-operative 5 CHG Bath Instructions   You can play a key role in reducing the risk of infection after surgery. Your skin needs to be as free of germs as possible. You can reduce the number of germs on your skin by washing with CHG (chlorhexidine gluconate) soap before surgery. CHG is an antiseptic soap that kills germs and continues to kill germs even after washing.   DO NOT use if you have an allergy to chlorhexidine/CHG or antibacterial soaps. If your skin becomes reddened or irritated, stop using the CHG and notify one of our RNs at [phone].   Please shower with the CHG soap starting 4 days before surgery using the following schedule:     Please keep in mind the following:  DO NOT shave, including legs and underarms, starting the day of your first shower.   You may shave your face at any point before/day of surgery.  Place clean sheets on your bed the day you start using CHG soap. Use a clean washcloth (not used since being washed) for each shower. DO NOT sleep with pets once you start using the CHG.   CHG Shower Instructions:  If you choose to wash your hair and private area, wash first with your normal shampoo/soap.  After you use shampoo/soap, rinse your hair and body thoroughly to remove shampoo/soap residue.  Turn the water OFF and apply about 3 tablespoons (45 ml) of CHG soap to a CLEAN  washcloth.  Apply CHG soap ONLY FROM YOUR NECK DOWN TO YOUR TOES (washing for 3-5 minutes)  DO NOT use CHG soap on face, private areas, open wounds, or sores.  Pay special attention to the area where your surgery is being performed.  If you are having back surgery, having someone wash your back for you may be helpful. Wait 2 minutes after CHG soap is applied, then you may rinse off the CHG soap.  Pat dry with a clean towel  Put on clean clothes/pajamas   If you choose to wear lotion, please use ONLY the CHG-compatible lotions on the back of this paper.     Additional instructions for the day of surgery: DO NOT APPLY any lotions,  deodorants, cologne, or perfumes.   Do not wear jewelry or makeup Do not wear nail polish, gel polish, artificial nails, or any other type of covering on natural nails (fingers and toes) Do not bring valuables to the hospital. [facility] is not responsible for any belongings or valuables. Put on clean/comfortable clothes.  Brush your teeth.  Ask your nurse before applying any prescription medications to the skin.      CHG Compatible Lotions   Aveeno Moisturizing lotion  Cetaphil Moisturizing Cream  Cetaphil Moisturizing Lotion  Clairol Herbal Essence Moisturizing Lotion, Dry Skin  Clairol Herbal Essence Moisturizing Lotion, Extra Dry Skin  Clairol Herbal Essence Moisturizing Lotion, Normal Skin  Curel Age Defying Therapeutic Moisturizing Lotion with Alpha Hydroxy  Curel Extreme Care Body Lotion  Curel Soothing Hands Moisturizing Hand Lotion  Curel Therapeutic Moisturizing Cream, Fragrance-Free  Curel Therapeutic Moisturizing Lotion, Fragrance-Free  Curel Therapeutic Moisturizing Lotion, Original Formula  Eucerin Daily Replenishing Lotion  Eucerin Dry Skin Therapy Plus Alpha Hydroxy Crme  Eucerin Dry Skin Therapy Plus Alpha Hydroxy Lotion  Eucerin Original Crme  Eucerin Original Lotion  Eucerin Plus Crme Eucerin Plus Lotion  Eucerin TriLipid  Replenishing Lotion  Keri Anti-Bacterial Hand Lotion  Keri Deep Conditioning Original Lotion Dry Skin Formula Softly Scented  Keri Deep Conditioning Original Lotion, Fragrance Free Sensitive Skin Formula  Keri Lotion Fast Absorbing Fragrance Free Sensitive Skin Formula  Keri Lotion Fast Absorbing Softly Scented Dry Skin Formula  Keri Original Lotion  Keri Skin Renewal Lotion Keri Silky Smooth Lotion  Keri Silky Smooth Sensitive Skin Lotion  Nivea Body Creamy Conditioning Oil  Nivea Body Extra Enriched Lotion  Nivea Body Original Lotion  Nivea Body Sheer Moisturizing Lotion Nivea Crme  Nivea Skin Firming Lotion  NutraDerm 30 Skin Lotion  NutraDerm Skin Lotion  NutraDerm Therapeutic Skin Cream  NutraDerm Therapeutic Skin Lotion  ProShield Protective Hand Cream  Provon moisturizing lotion   Please read over the following fact sheets that you were given.

## 2022-08-29 ENCOUNTER — Other Ambulatory Visit: Payer: Self-pay

## 2022-08-29 ENCOUNTER — Encounter (HOSPITAL_COMMUNITY): Payer: Self-pay

## 2022-08-29 ENCOUNTER — Encounter (HOSPITAL_COMMUNITY)
Admission: RE | Admit: 2022-08-29 | Discharge: 2022-08-29 | Disposition: A | Discharge status: Home / Self Care | Payer: Medicare HMO | Source: Ambulatory Visit | Attending: Neurosurgery | Admitting: Neurosurgery

## 2022-08-29 VITALS — BP 132/95 | HR 58 | Temp 97.7°F | Resp 17 | Ht 65.0 in | Wt 122.8 lb

## 2022-08-29 DIAGNOSIS — I129 Hypertensive chronic kidney disease with stage 1 through stage 4 chronic kidney disease, or unspecified chronic kidney disease: Secondary | ICD-10-CM | POA: Diagnosis not present

## 2022-08-29 DIAGNOSIS — N189 Chronic kidney disease, unspecified: Secondary | ICD-10-CM | POA: Diagnosis not present

## 2022-08-29 DIAGNOSIS — R001 Bradycardia, unspecified: Secondary | ICD-10-CM | POA: Diagnosis not present

## 2022-08-29 DIAGNOSIS — Z01818 Encounter for other preprocedural examination: Secondary | ICD-10-CM

## 2022-08-29 DIAGNOSIS — I2510 Atherosclerotic heart disease of native coronary artery without angina pectoris: Secondary | ICD-10-CM

## 2022-08-29 DIAGNOSIS — E1122 Type 2 diabetes mellitus with diabetic chronic kidney disease: Secondary | ICD-10-CM | POA: Diagnosis not present

## 2022-08-29 DIAGNOSIS — E785 Hyperlipidemia, unspecified: Secondary | ICD-10-CM | POA: Diagnosis not present

## 2022-08-29 DIAGNOSIS — F0390 Unspecified dementia without behavioral disturbance: Secondary | ICD-10-CM | POA: Insufficient documentation

## 2022-08-29 HISTORY — DX: Unspecified lack of expected normal physiological development in childhood: R62.50

## 2022-08-29 LAB — CBC
HCT: 41 % (ref 39.0–52.0)
Hemoglobin: 12.9 g/dL — ABNORMAL LOW (ref 13.0–17.0)
MCH: 26.8 pg (ref 26.0–34.0)
MCHC: 31.5 g/dL (ref 30.0–36.0)
MCV: 85.1 fL (ref 80.0–100.0)
Platelets: 199 10*3/uL (ref 150–400)
RBC: 4.82 MIL/uL (ref 4.22–5.81)
RDW: 17.1 % — ABNORMAL HIGH (ref 11.5–15.5)
WBC: 5.6 10*3/uL (ref 4.0–10.5)
nRBC: 0 % (ref 0.0–0.2)

## 2022-08-29 LAB — BASIC METABOLIC PANEL
Anion gap: 9 (ref 5–15)
BUN: 22 mg/dL (ref 8–23)
CO2: 24 mmol/L (ref 22–32)
Calcium: 9.6 mg/dL (ref 8.9–10.3)
Chloride: 106 mmol/L (ref 98–111)
Creatinine, Ser: 1.71 mg/dL — ABNORMAL HIGH (ref 0.61–1.24)
GFR, Estimated: 44 mL/min — ABNORMAL LOW (ref >60)
Glucose, Bld: 115 mg/dL — ABNORMAL HIGH (ref 70–99)
Potassium: 4.3 mmol/L (ref 3.5–5.1)
Sodium: 139 mmol/L (ref 135–145)

## 2022-08-29 LAB — SURGICAL PCR SCREEN
MRSA, PCR: NEGATIVE
Staphylococcus aureus: NEGATIVE

## 2022-08-29 LAB — GLUCOSE, CAPILLARY: Glucose-Capillary: 106 mg/dL — ABNORMAL HIGH (ref 70–99)

## 2022-08-29 NOTE — Progress Notes (Signed)
PCP - Jannifer Rodney, FNP Cardiologist - Denies  PPM/ICD - Denies Device Orders - n/a Rep Notified - n/a  Chest x-ray - Denies EKG - 08/29/2022 Stress Test - Denies ECHO - Denies Cardiac Cath - Denies  Sleep Study - Denies CPAP - n/a  Pts sister, POA, states that patient is not DM, but last A1c 7 months ago was 6.8 and he is also on Metformin. Pt has a meter at home, but does not check his blood sugar regularly. CBG checked at PAT, result 106. A1c pending  Last dose of GLP1 agonist-  n/a GLP1 instructions: n/a  Blood Thinner Instructions: n/a Aspirin Instructions: n/a  NPO after midnight  COVID TEST- n/a   Anesthesia review: Yes. Borderline EKG review. 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 Pts sister accompanied pt at visit and is POA/signs all paperwork for him. She has been his caregiver for 20+ years. She also cares for their sister who is also developmentally delayed due to alcoholic syndrome.  Patient denies shortness of breath, fever, cough and chest pain at PAT appointment. Pt/pts sister denies any respiratory illness/infection in the last two months.   All instructions explained to the patient, with a verbal understanding of the material. Patient agrees to go over the instructions while at home for a better understanding. Patient also instructed to self quarantine after being tested for COVID-19. The opportunity to ask questions was provided.

## 2022-08-30 NOTE — Anesthesia Preprocedure Evaluation (Signed)
Anesthesia Evaluation  Patient identified by MRN, date of birth, ID band Patient awake    Reviewed: Allergy & Precautions, NPO status , Patient's Chart, lab work & pertinent test results  History of Anesthesia Complications Negative for: history of anesthetic complications  Airway Mallampati: II  TM Distance: >3 FB Neck ROM: Full    Dental  (+) Dental Advisory Given   Pulmonary former smoker sarcoidosis   breath sounds clear to auscultation       Cardiovascular hypertension, Pt. on medications (-) angina  Rhythm:Regular Rate:Normal     Neuro/Psych  PSYCHIATRIC DISORDERS     Dementia    GI/Hepatic negative GI ROS,,,(+)     substance abuse  alcohol use  Endo/Other  diabetes (glu 80), Oral Hypoglycemic Agents    Renal/GU Renal InsufficiencyRenal disease     Musculoskeletal   Abdominal   Peds  (+) mental retardation Hematology Hb 12.9   Anesthesia Other Findings   Reproductive/Obstetrics                              Anesthesia Physical Anesthesia Plan  ASA: 3  Anesthesia Plan: General   Post-op Pain Management: Tylenol PO (pre-op)*   Induction: Intravenous  PONV Risk Score and Plan: 2 and Ondansetron and Dexamethasone  Airway Management Planned: Oral ETT and Video Laryngoscope Planned  Additional Equipment: None  Intra-op Plan:   Post-operative Plan: Extubation in OR  Informed Consent: I have reviewed the patients History and Physical, chart, labs and discussed the procedure including the risks, benefits and alternatives for the proposed anesthesia with the patient or authorized representative who has indicated his/her understanding and acceptance.     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  He does not read or write.  He follows with neurology at [facility]  Last seen by Durenda Age, PA-C 06/17/2022 and felt to be stable at that time.  He is maintained on donepezil and memantine.  MRI of the brain was ordered at that time which showed no evidence of acute intracranial abnormality with incidental finding of incompletely visualized spinal canal stenosis.  This was followed up with a cervical spine MR on 08/12/2022 which showed severe stenosis at C4-5 with hyperintense T2 signal felt likely to represent myelomalacia.  He was urgently referred to neurosurgery.  Preop labs reviewed, creatinine elevated 1.71 consistent with history of CKD, otherwise unremarkable.  EKG 08/30/2022: Sinus bradycardia.  Rate 53.  Moderate voltage criteria for LVH, may be normal variant.  )         Anesthesia Quick Evaluation

## 2022-08-30 NOTE — Progress Notes (Signed)
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  He does not read or write.  He follows with neurology at [facility].  Last seen by Durenda Age, PA-C 06/17/2022 and felt to be stable at that time.  He is maintained on donepezil and memantine.  MRI of the brain was ordered at that time which showed no evidence of acute intracranial abnormality with incidental finding of incompletely visualized spinal canal stenosis.  This was followed up with a cervical spine MR on 08/12/2022 which showed severe stenosis at C4-5 with hyperintense T2 signal felt likely to represent myelomalacia.  He was urgently referred to neurosurgery.  Preop labs reviewed, creatinine elevated 1.71 consistent with history of CKD, otherwise unremarkable.  EKG 08/30/2022: Sinus bradycardia.  Rate 53.  Moderate voltage criteria for LVH, may be normal variant.   Zannie Cove [facility] Short Stay Center/Anesthesiology Phone [phone] 08/30/2022 10:54 AM

## 2022-08-31 LAB — HEMOGLOBIN A1C
Hgb A1c MFr Bld: 7 % — ABNORMAL HIGH (ref 4.8–5.6)
Mean Plasma Glucose: 154 mg/dL

## 2022-09-03 ENCOUNTER — Ambulatory Visit (HOSPITAL_COMMUNITY): Payer: Medicare HMO

## 2022-09-03 ENCOUNTER — Other Ambulatory Visit: Payer: Self-pay

## 2022-09-03 ENCOUNTER — Encounter (HOSPITAL_COMMUNITY): Payer: Self-pay | Admitting: Neurosurgery

## 2022-09-03 ENCOUNTER — Ambulatory Visit (HOSPITAL_COMMUNITY): Payer: Medicare HMO | Admitting: Physician Assistant

## 2022-09-03 ENCOUNTER — Ambulatory Visit (HOSPITAL_COMMUNITY)
Admission: RE | Disposition: A | Discharge status: Home / Self Care | Payer: Self-pay | Source: Home / Self Care | Attending: Neurosurgery

## 2022-09-03 ENCOUNTER — Observation Stay (HOSPITAL_COMMUNITY)
Admission: RE | Admit: 2022-09-03 | Discharge: 2022-09-04 | Disposition: A | Discharge status: Home / Self Care | Payer: Medicare HMO | Attending: Neurosurgery | Admitting: Neurosurgery

## 2022-09-03 ENCOUNTER — Ambulatory Visit (HOSPITAL_BASED_OUTPATIENT_CLINIC_OR_DEPARTMENT_OTHER): Payer: Medicare HMO | Admitting: Certified Registered Nurse Anesthetist

## 2022-09-03 DIAGNOSIS — M4802 Spinal stenosis, cervical region: Secondary | ICD-10-CM | POA: Insufficient documentation

## 2022-09-03 DIAGNOSIS — M50021 Cervical disc disorder at C4-C5 level with myelopathy: Secondary | ICD-10-CM | POA: Diagnosis not present

## 2022-09-03 DIAGNOSIS — I10 Essential (primary) hypertension: Secondary | ICD-10-CM

## 2022-09-03 DIAGNOSIS — E119 Type 2 diabetes mellitus without complications: Secondary | ICD-10-CM

## 2022-09-03 DIAGNOSIS — Z01818 Encounter for other preprocedural examination: Secondary | ICD-10-CM

## 2022-09-03 DIAGNOSIS — Z79899 Other long term (current) drug therapy: Secondary | ICD-10-CM | POA: Diagnosis not present

## 2022-09-03 DIAGNOSIS — M5001 Cervical disc disorder with myelopathy,  high cervical region: Secondary | ICD-10-CM | POA: Diagnosis not present

## 2022-09-03 DIAGNOSIS — F0390 Unspecified dementia without behavioral disturbance: Secondary | ICD-10-CM | POA: Diagnosis not present

## 2022-09-03 DIAGNOSIS — G992 Myelopathy in diseases classified elsewhere: Secondary | ICD-10-CM | POA: Diagnosis present

## 2022-09-03 DIAGNOSIS — Z7984 Long term (current) use of oral hypoglycemic drugs: Secondary | ICD-10-CM | POA: Diagnosis not present

## 2022-09-03 DIAGNOSIS — Z981 Arthrodesis status: Secondary | ICD-10-CM | POA: Diagnosis not present

## 2022-09-03 DIAGNOSIS — Z87891 Personal history of nicotine dependence: Secondary | ICD-10-CM

## 2022-09-03 HISTORY — PX: ANTERIOR CERVICAL DECOMP/DISCECTOMY FUSION: SHX1161

## 2022-09-03 LAB — GLUCOSE, CAPILLARY
Glucose-Capillary: 94 mg/dL (ref 70–99)
Glucose-Capillary: 80 mg/dL (ref 70–99)
Glucose-Capillary: 125 mg/dL — ABNORMAL HIGH (ref 70–99)
Glucose-Capillary: 127 mg/dL — ABNORMAL HIGH (ref 70–99)
Glucose-Capillary: 197 mg/dL — ABNORMAL HIGH (ref 70–99)

## 2022-09-03 SURGERY — ANTERIOR CERVICAL DECOMPRESSION/DISCECTOMY FUSION 2 LEVELS
Anesthesia: General

## 2022-09-03 MED ORDER — MIDAZOLAM HCL 2 MG/2ML IJ SOLN: 0.5000 mg | Freq: Once | INTRAMUSCULAR | Status: DC | PRN

## 2022-09-03 MED ORDER — ONDANSETRON HCL 4 MG/2ML IJ SOLN: 4.0000 mg | Freq: Four times a day (QID) | INTRAMUSCULAR | Status: DC | PRN

## 2022-09-03 MED ORDER — CEFAZOLIN SODIUM-DEXTROSE 1-4 GM/50ML-% IV SOLN
1.0000 g | Freq: Three times a day (TID) | INTRAVENOUS | Status: AC
  Administered 2022-09-03 – 2022-09-04 (×2): 1 g via INTRAVENOUS
  Filled 2022-09-03 (×2): qty 50

## 2022-09-03 MED ORDER — ACETAMINOPHEN 500 MG PO TABS
1000.0000 mg | ORAL_TABLET | Freq: Once | ORAL | Status: AC
  Administered 2022-09-03: 1000 mg via ORAL
  Filled 2022-09-03: qty 2

## 2022-09-03 MED ORDER — CEFAZOLIN SODIUM-DEXTROSE 2-4 GM/100ML-% IV SOLN
2.0000 g | INTRAVENOUS | Status: AC
  Administered 2022-09-03: 2 g via INTRAVENOUS
  Filled 2022-09-03: qty 100

## 2022-09-03 MED ORDER — PHENYLEPHRINE HCL-NACL 20-0.9 MG/250ML-% IV SOLN
INTRAVENOUS | Status: DC | PRN
  Administered 2022-09-03: 20 ug/min via INTRAVENOUS

## 2022-09-03 MED ORDER — HYDROCHLOROTHIAZIDE 25 MG PO TABS
25.0000 mg | ORAL_TABLET | Freq: Every day | ORAL | Status: DC
  Administered 2022-09-03 – 2022-09-04 (×2): 25 mg via ORAL
  Filled 2022-09-03 (×2): qty 1

## 2022-09-03 MED ORDER — OXYCODONE HCL 5 MG PO TABS: 5.0000 mg | ORAL_TABLET | Freq: Once | ORAL | Status: DC | PRN

## 2022-09-03 MED ORDER — SODIUM CHLORIDE 0.9% FLUSH: 3.0000 mL | INTRAVENOUS | Status: DC | PRN

## 2022-09-03 MED ORDER — OXYCODONE HCL 5 MG/5ML PO SOLN: 5.0000 mg | Freq: Once | ORAL | Status: DC | PRN

## 2022-09-03 MED ORDER — MENTHOL 3 MG MT LOZG: 1.0000 | LOZENGE | OROMUCOSAL | Status: DC | PRN

## 2022-09-03 MED ORDER — CHLORHEXIDINE GLUCONATE CLOTH 2 % EX PADS: 6.0000 | MEDICATED_PAD | Freq: Once | CUTANEOUS | Status: DC

## 2022-09-03 MED ORDER — ROCURONIUM BROMIDE 10 MG/ML (PF) SYRINGE
PREFILLED_SYRINGE | INTRAVENOUS | Status: DC | PRN
  Administered 2022-09-03: 60 mg via INTRAVENOUS
  Administered 2022-09-03: 40 mg via INTRAVENOUS

## 2022-09-03 MED ORDER — PROPOFOL 10 MG/ML IV BOLUS
INTRAVENOUS | Status: DC | PRN
  Administered 2022-09-03: 100 mg via INTRAVENOUS
  Administered 2022-09-03: 50 mg via INTRAVENOUS

## 2022-09-03 MED ORDER — ONDANSETRON HCL 4 MG/2ML IJ SOLN
INTRAMUSCULAR | Status: DC | PRN
  Administered 2022-09-03: 4 mg via INTRAVENOUS

## 2022-09-03 MED ORDER — THROMBIN 20000 UNITS EX SOLR
CUTANEOUS | Status: AC
  Filled 2022-09-03: qty 20000

## 2022-09-03 MED ORDER — HYDROCODONE-ACETAMINOPHEN 10-325 MG PO TABS
1.0000 | ORAL_TABLET | ORAL | Status: DC | PRN
  Administered 2022-09-03 – 2022-09-04 (×4): 1 via ORAL
  Filled 2022-09-03 (×5): qty 1

## 2022-09-03 MED ORDER — CHLORHEXIDINE GLUCONATE 0.12 % MT SOLN
15.0000 mL | Freq: Once | OROMUCOSAL | Status: AC
  Administered 2022-09-03: 15 mL via OROMUCOSAL
  Filled 2022-09-03: qty 15

## 2022-09-03 MED ORDER — ALBUMIN HUMAN 5 % IV SOLN: INTRAVENOUS | Status: DC | PRN

## 2022-09-03 MED ORDER — HYDROMORPHONE HCL 1 MG/ML IJ SOLN
INTRAMUSCULAR | Status: AC
  Filled 2022-09-03: qty 1

## 2022-09-03 MED ORDER — AMLODIPINE BESYLATE 10 MG PO TABS
10.0000 mg | ORAL_TABLET | Freq: Every day | ORAL | Status: DC
  Administered 2022-09-04: 10 mg via ORAL
  Filled 2022-09-03: qty 1

## 2022-09-03 MED ORDER — ONDANSETRON HCL 4 MG/2ML IJ SOLN
INTRAMUSCULAR | Status: AC
  Filled 2022-09-03: qty 2

## 2022-09-03 MED ORDER — SODIUM CHLORIDE 0.9 % IV SOLN: INTRAVENOUS | Status: DC | PRN

## 2022-09-03 MED ORDER — LIDOCAINE 2% (20 MG/ML) 5 ML SYRINGE
INTRAMUSCULAR | Status: DC | PRN
  Administered 2022-09-03: 60 mg via INTRAVENOUS

## 2022-09-03 MED ORDER — HYDROCODONE-ACETAMINOPHEN 5-325 MG PO TABS: 1.0000 | ORAL_TABLET | ORAL | Status: DC | PRN

## 2022-09-03 MED ORDER — SUGAMMADEX SODIUM 200 MG/2ML IV SOLN
INTRAVENOUS | Status: DC | PRN
  Administered 2022-09-03: 112 mg via INTRAVENOUS

## 2022-09-03 MED ORDER — THROMBIN 5000 UNITS EX SOLR
CUTANEOUS | Status: AC
  Filled 2022-09-03: qty 5000

## 2022-09-03 MED ORDER — DONEPEZIL HCL 10 MG PO TABS
10.0000 mg | ORAL_TABLET | Freq: Every day | ORAL | Status: DC
  Administered 2022-09-03: 10 mg via ORAL
  Filled 2022-09-03 (×2): qty 1

## 2022-09-03 MED ORDER — CYCLOBENZAPRINE HCL 10 MG PO TABS
10.0000 mg | ORAL_TABLET | Freq: Three times a day (TID) | ORAL | Status: DC | PRN
  Administered 2022-09-03 – 2022-09-04 (×2): 10 mg via ORAL
  Filled 2022-09-03 (×2): qty 1

## 2022-09-03 MED ORDER — FENTANYL CITRATE (PF) 250 MCG/5ML IJ SOLN
INTRAMUSCULAR | Status: DC | PRN
  Administered 2022-09-03: 250 ug via INTRAVENOUS

## 2022-09-03 MED ORDER — ORAL CARE MOUTH RINSE: 15.0000 mL | Freq: Once | OROMUCOSAL | Status: AC

## 2022-09-03 MED ORDER — LACTATED RINGERS IV SOLN: INTRAVENOUS | Status: DC

## 2022-09-03 MED ORDER — PHENOL 1.4 % MT LIQD: 1.0000 | OROMUCOSAL | Status: DC | PRN

## 2022-09-03 MED ORDER — 0.9 % SODIUM CHLORIDE (POUR BTL) OPTIME
TOPICAL | Status: DC | PRN
  Administered 2022-09-03: 1000 mL

## 2022-09-03 MED ORDER — PROMETHAZINE HCL 25 MG/ML IJ SOLN: 6.2500 mg | INTRAMUSCULAR | Status: DC | PRN

## 2022-09-03 MED ORDER — LIDOCAINE 2% (20 MG/ML) 5 ML SYRINGE
INTRAMUSCULAR | Status: AC
  Filled 2022-09-03: qty 5

## 2022-09-03 MED ORDER — VITAMIN B-12 1000 MCG PO TABS
1000.0000 ug | ORAL_TABLET | Freq: Every day | ORAL | Status: DC
  Administered 2022-09-04: 1000 ug via ORAL
  Filled 2022-09-03: qty 1

## 2022-09-03 MED ORDER — ACETAMINOPHEN 650 MG RE SUPP: 650.0000 mg | RECTAL | Status: DC | PRN

## 2022-09-03 MED ORDER — SODIUM CHLORIDE 0.9 % IV SOLN
250.0000 mL | INTRAVENOUS | Status: DC
  Administered 2022-09-03: 250 mL via INTRAVENOUS

## 2022-09-03 MED ORDER — SODIUM CHLORIDE (PF) 0.9 % IJ SOLN: OROMUCOSAL | Status: DC | PRN

## 2022-09-03 MED ORDER — ROCURONIUM BROMIDE 10 MG/ML (PF) SYRINGE
PREFILLED_SYRINGE | INTRAVENOUS | Status: AC
  Filled 2022-09-03: qty 10

## 2022-09-03 MED ORDER — THROMBIN 20000 UNITS EX SOLR
CUTANEOUS | Status: DC | PRN
  Administered 2022-09-03: 20 mL via TOPICAL

## 2022-09-03 MED ORDER — ONDANSETRON HCL 4 MG PO TABS: 4.0000 mg | ORAL_TABLET | Freq: Four times a day (QID) | ORAL | Status: DC | PRN

## 2022-09-03 MED ORDER — ROSUVASTATIN CALCIUM 5 MG PO TABS: 10.0000 mg | ORAL_TABLET | Freq: Every day | ORAL | Status: DC

## 2022-09-03 MED ORDER — HYDROMORPHONE HCL 1 MG/ML IJ SOLN: 1.0000 mg | INTRAMUSCULAR | Status: DC | PRN

## 2022-09-03 MED ORDER — ACETAMINOPHEN 325 MG PO TABS: 650.0000 mg | ORAL_TABLET | ORAL | Status: DC | PRN

## 2022-09-03 MED ORDER — METFORMIN HCL 500 MG PO TABS
500.0000 mg | ORAL_TABLET | Freq: Two times a day (BID) | ORAL | Status: DC
  Administered 2022-09-03 – 2022-09-04 (×2): 500 mg via ORAL
  Filled 2022-09-03 (×2): qty 1

## 2022-09-03 MED ORDER — MEMANTINE HCL 10 MG PO TABS
10.0000 mg | ORAL_TABLET | Freq: Two times a day (BID) | ORAL | Status: DC
  Administered 2022-09-03 – 2022-09-04 (×2): 10 mg via ORAL
  Filled 2022-09-03 (×4): qty 1

## 2022-09-03 MED ORDER — SODIUM CHLORIDE 0.9% FLUSH: 3.0000 mL | Freq: Two times a day (BID) | INTRAVENOUS | Status: DC

## 2022-09-03 MED ORDER — DEXAMETHASONE SODIUM PHOSPHATE 10 MG/ML IJ SOLN
INTRAMUSCULAR | Status: AC
  Filled 2022-09-03: qty 1

## 2022-09-03 MED ORDER — OLMESARTAN MEDOXOMIL-HCTZ 40-25 MG PO TABS: 1.0000 | ORAL_TABLET | Freq: Every day | ORAL | Status: DC

## 2022-09-03 MED ORDER — IRBESARTAN 150 MG PO TABS
300.0000 mg | ORAL_TABLET | Freq: Every day | ORAL | Status: DC
  Administered 2022-09-03 – 2022-09-04 (×2): 300 mg via ORAL
  Filled 2022-09-03 (×2): qty 2

## 2022-09-03 MED ORDER — INSULIN ASPART 100 UNIT/ML IJ SOLN: 0.0000 [IU] | INTRAMUSCULAR | Status: DC | PRN

## 2022-09-03 MED ORDER — HYDROMORPHONE HCL 1 MG/ML IJ SOLN
0.2500 mg | INTRAMUSCULAR | Status: DC | PRN
  Administered 2022-09-03: 0.25 mg via INTRAVENOUS

## 2022-09-03 MED ORDER — DEXAMETHASONE SODIUM PHOSPHATE 10 MG/ML IJ SOLN
INTRAMUSCULAR | Status: DC | PRN
  Administered 2022-09-03: 10 mg via INTRAVENOUS

## 2022-09-03 MED ORDER — PROPOFOL 500 MG/50ML IV EMUL
INTRAVENOUS | Status: DC | PRN
  Administered 2022-09-03: 100 ug/kg/min via INTRAVENOUS

## 2022-09-03 MED ORDER — FENTANYL CITRATE (PF) 250 MCG/5ML IJ SOLN
INTRAMUSCULAR | Status: AC
  Filled 2022-09-03: qty 5

## 2022-09-03 SURGICAL SUPPLY — 52 items
APL SKNCLS STERI-STRIP NONHPOA (GAUZE/BANDAGES/DRESSINGS) ×1
BAG COUNTER SPONGE SURGICOUNT (BAG) ×1 IMPLANT
BAG DECANTER FOR FLEXI CONT (MISCELLANEOUS) ×1 IMPLANT
BAG SPNG CNTER NS LX DISP (BAG) ×1
BENZOIN TINCTURE PRP APPL 2/3 (GAUZE/BANDAGES/DRESSINGS) ×1 IMPLANT
BIT DRILL 13 (BIT) IMPLANT
BUR MATCHSTICK NEURO 3.0 LAGG (BURR) ×1 IMPLANT
CANISTER SUCT 3000ML PPV (MISCELLANEOUS) ×1 IMPLANT
DRAPE C-ARM 42X72 X-RAY (DRAPES) ×2 IMPLANT
DRAPE LAPAROTOMY 100X72 PEDS (DRAPES) ×1 IMPLANT
DRAPE MICROSCOPE SLANT 54X150 (MISCELLANEOUS) ×1 IMPLANT
DURAPREP 6ML APPLICATOR 50/CS (WOUND CARE) ×1 IMPLANT
ELECT COATED BLADE 2.86 ST (ELECTRODE) ×1 IMPLANT
ELECT REM PT RETURN 9FT ADLT (ELECTROSURGICAL) ×1
ELECTRODE REM PT RTRN 9FT ADLT (ELECTROSURGICAL) ×1 IMPLANT
GAUZE 4X4 16PLY ~~LOC~~+RFID DBL (SPONGE) IMPLANT
GAUZE SPONGE 4X4 12PLY STRL (GAUZE/BANDAGES/DRESSINGS) ×1 IMPLANT
GLOVE ECLIPSE 9.0 STRL (GLOVE) ×1 IMPLANT
GLOVE EXAM NITRILE XL STR (GLOVE) IMPLANT
GOWN STRL REUS W/ TWL LRG LVL3 (GOWN DISPOSABLE) IMPLANT
GOWN STRL REUS W/ TWL XL LVL3 (GOWN DISPOSABLE) ×1 IMPLANT
GOWN STRL REUS W/TWL 2XL LVL3 (GOWN DISPOSABLE) IMPLANT
GOWN STRL REUS W/TWL LRG LVL3 (GOWN DISPOSABLE)
GOWN STRL REUS W/TWL XL LVL3 (GOWN DISPOSABLE) ×1
HALTER HD/CHIN CERV TRACTION D (MISCELLANEOUS) ×1 IMPLANT
HEMOSTAT POWDER KIT SURGIFOAM (HEMOSTASIS) IMPLANT
HEMOSTAT SURGICEL 2X14 (HEMOSTASIS) IMPLANT
KIT BASIN OR (CUSTOM PROCEDURE TRAY) ×1 IMPLANT
KIT TURNOVER KIT B (KITS) ×1 IMPLANT
NDL SPNL 20GX3.5 QUINCKE YW (NEEDLE) ×1 IMPLANT
NEEDLE SPNL 20GX3.5 QUINCKE YW (NEEDLE) ×1 IMPLANT
NS IRRIG 1000ML POUR BTL (IV SOLUTION) ×1 IMPLANT
PACK LAMINECTOMY NEURO (CUSTOM PROCEDURE TRAY) ×1 IMPLANT
PAD ARMBOARD 7.5X6 YLW CONV (MISCELLANEOUS) ×3 IMPLANT
PLATE VISION ELITE 40MM (Plate) IMPLANT
SCREW ST 13X4XST VA NS SPNE (Screw) IMPLANT
SCREW ST VAR 4 ATL (Screw) ×6 IMPLANT
SOL ELECTROSURG ANTI STICK (MISCELLANEOUS) ×1
SOLUTION ELECTROSURG ANTI STCK (MISCELLANEOUS) ×1 IMPLANT
SPACER BONE ASR 6X14X11 (Cage) IMPLANT
SPACER BONE CORNERSTONE 6X14 (Orthopedic Implant) IMPLANT
SPONGE INTESTINAL PEANUT (DISPOSABLE) ×1 IMPLANT
SPONGE SURGIFOAM ABS GEL SZ50 (HEMOSTASIS) ×1 IMPLANT
STRIP CLOSURE SKIN 1/2X4 (GAUZE/BANDAGES/DRESSINGS) ×1 IMPLANT
SUT VIC AB 3-0 SH 8-18 (SUTURE) ×1 IMPLANT
SUT VIC AB 4-0 RB1 18 (SUTURE) ×1 IMPLANT
TAPE CLOTH 4X10 WHT NS (GAUZE/BANDAGES/DRESSINGS) ×1 IMPLANT
TAPE CLOTH SURG 4X10 WHT LF (GAUZE/BANDAGES/DRESSINGS) IMPLANT
TOWEL GREEN STERILE (TOWEL DISPOSABLE) ×1 IMPLANT
TOWEL GREEN STERILE FF (TOWEL DISPOSABLE) ×1 IMPLANT
TRAP SPECIMEN MUCUS 40CC (MISCELLANEOUS) ×1 IMPLANT
WATER STERILE IRR 1000ML POUR (IV SOLUTION) ×1 IMPLANT

## 2022-09-03 NOTE — Transfer of Care (Signed)
Immediate Anesthesia Transfer of Care Note  Patient: Eric Stuart  Procedure(s) Performed: Anterior Cervical Decompression Fusion  Cervical three-four, Cervical four-five  Patient Location: PACU  Anesthesia Type:General  Level of Consciousness: awake  Airway & Oxygen Therapy: Patient Spontanous Breathing  Post-op Assessment: Report given to RN and Post -op Vital signs reviewed and stable  Post vital signs: Reviewed and stable  Last Vitals:  Vitals Value Taken Time  BP 136/84   Temp 98   Pulse 81 09/03/22 1406  Resp 17 09/03/22 1406  SpO2 100 % 09/03/22 1406  Vitals shown include unvalidated device data.  Last Pain:  Vitals:   09/03/22 0914  TempSrc:   PainSc: 0-No pain         Complications: No notable events documented.

## 2022-09-03 NOTE — Anesthesia Procedure Notes (Signed)
Procedure Name: Intubation Date/Time: 09/03/2022 12:00 PM  Performed by: Darryl Nestle, CRNAPre-anesthesia Checklist: Patient identified, Emergency Drugs available, Suction available and Patient being monitored Patient Re-evaluated:Patient Re-evaluated prior to induction Oxygen Delivery Method: Circle system utilized Preoxygenation: Pre-oxygenation with 100% oxygen Induction Type: IV induction Ventilation: Mask ventilation without difficulty Laryngoscope Size: Mac, 3 and Glidescope Grade View: Grade II Tube type: Oral Tube size: 7.0 mm Number of attempts: 1 Airway Equipment and Method: Stylet and Oral airway Placement Confirmation: ETT inserted through vocal cords under direct vision, positive ETCO2 and breath sounds checked- equal and bilateral Secured at: 23 cm Tube secured with: Tape Dental Injury: Teeth and Oropharynx as per pre-operative assessment

## 2022-09-03 NOTE — Anesthesia Postprocedure Evaluation (Signed)
Anesthesia Post Note  Patient: Eric Stuart  Procedure(s) Performed: Anterior Cervical Decompression Fusion  Cervical three-four, Cervical four-five     Patient location during evaluation: PACU Anesthesia Type: General Level of consciousness: awake and alert, patient cooperative and oriented Pain management: pain level controlled Vital Signs Assessment: post-procedure vital signs reviewed and stable Respiratory status: spontaneous breathing, nonlabored ventilation, respiratory function stable and patient connected to nasal cannula oxygen Cardiovascular status: blood pressure returned to baseline and stable Postop Assessment: no apparent nausea or vomiting Anesthetic complications: no   No notable events documented.  Last Vitals:  Vitals:   09/03/22 1500 09/03/22 1515  BP: 133/82 (!) 149/99  Pulse: 84 96  Resp: 20 18  Temp:  37.1 C  SpO2: 100% 98%    Last Pain:  Vitals:   09/03/22 1500  TempSrc:   PainSc: Asleep                 Mayara Paulson,E. Mylea Roarty

## 2022-09-03 NOTE — H&P (Signed)
Eric Stuart is an 67 y.o. male.   Chief Complaint: Weakness HPI: 67 year old male with cognitive disability secondary to fetal alcohol syndrome.  Patient lives with his sister.  He has had a history of some neck pain and some intermittent numbness and weakness into his extremities.  Workup demonstrates evidence of critical cervical stenosis at C4-5 and also with severe stenosis at C3-4.  Patient presents now for two-level anterior cervical decompression and fusion in hopes of improving his situation.  Past Medical History:  Diagnosis Date   Dementia (HCC)    Developmental delay    related to alcoholic syndrome   Diabetes mellitus without complication (HCC)    Hyperlipidemia    Hypertension    Sarcoidosis 36   Sister provided     Past Surgical History:  Procedure Laterality Date   ADENOIDECTOMY  childhod   COLONOSCOPY  2012   TONSILLECTOMY      Family History  Problem Relation Age of Onset   Cancer Mother        cervical   Peripheral Artery Disease Father    Heart failure Father    Diabetes Sister    Heart attack Brother    Heart attack Sister    Stroke Sister    Colon cancer Neg Hx    Colon polyps Neg Hx    Esophageal cancer Neg Hx    Rectal cancer Neg Hx    Stomach cancer Neg Hx    Social History:  reports that he quit smoking about 8 years ago. His smoking use included cigarettes. He has a 8.00 pack-year smoking history. He has never used smokeless tobacco. He reports that he does not drink alcohol and does not use drugs.  Allergies: No Known Allergies  Medications Prior to Admission  Medication Sig Dispense Refill   amLODipine (NORVASC) 10 MG tablet Take 1 tablet (10 mg total) by mouth daily. 30 tablet 0   cyanocobalamin (VITAMIN B12) 1000 MCG tablet Take 1,000 mcg by mouth daily.     donepezil (ARICEPT) 10 MG tablet Take 1 tablet (10 mg total) by mouth at bedtime. 90 tablet 2   memantine (NAMENDA) 10 MG tablet Take 10 mg by mouth 2 (two) times daily.      metFORMIN (GLUCOPHAGE) 1000 MG tablet Take 0.5 tablets (500 mg total) by mouth 2 (two) times daily with a meal. 180 tablet 3   olmesartan-hydrochlorothiazide (BENICAR HCT) 40-25 MG tablet Take 1 tablet by mouth daily. 100 tablet 0   rosuvastatin (CRESTOR) 10 MG tablet Take 1 tablet (10 mg total) by mouth daily. 30 tablet 0   diclofenac (VOLTAREN) 75 MG EC tablet Take 1 tablet (75 mg total) by mouth 2 (two) times daily. (Patient not taking: Reported on 06/17/2022) 180 tablet 1    Results for orders placed or performed during the hospital encounter of 09/03/22 (from the past 48 hour(s))  Glucose, capillary     Status: None   Collection Time: 09/03/22  9:05 AM  Result Value Ref Range   Glucose-Capillary 94 70 - 99 mg/dL    Comment: Glucose reference range applies only to samples taken after fasting for at least 8 hours.   Comment 1 Notify RN    Comment 2 Document in Chart   Glucose, capillary     Status: None   Collection Time: 09/03/22 11:09 AM  Result Value Ref Range   Glucose-Capillary 80 70 - 99 mg/dL    Comment: Glucose reference range applies only to samples taken after fasting for  at least 8 hours.   No results found.  Review of systems not obtained due to patient factors.  Blood pressure 138/89, pulse 81, temperature 97.7 F (36.5 C), temperature source Oral, resp. rate 18, height 5\' 5"  (1.651 m), weight 56 kg, SpO2 97 %.  Patient is awake and aware.  He is oriented to person and place and time.  Cognitive function is poor.  Short-term recall is poor.  Judgment insight are impaired.  Cranial nerve function normal bilateral.  Motor examination reveals 5/5 motor strength bilaterally.  Sensory examination with some patchy distal sensory loss in both upper extremities.  Reflexes are hyperactive.  Hoffmann's responses are present in both hands.  Examination head ears eyes nose and throat is unremarked.  Chest and abdomen are benign.  Extremities are free from injury or  deformity. Assessment/Plan Severe cervical stenosis with myelopathy.  Plan C3-4, C4-5 anterior cervical discectomy with interbody fusion utilizing interbody cages, local harvested autograft, and anterior plate instrumentation.  Risks and benefits been explained.  Patient and his sister wish to proceed.  Patient was initially scheduled for C4-5 and C5-6 anterior cervical discectomy.  This was a charting error.  The plan is always been to do C3-4 and C4-5.  Eric Stuart A Eric Stuart 09/03/2022, 11:20 AM

## 2022-09-03 NOTE — Brief Op Note (Signed)
09/03/2022  1:52 PM  PATIENT:  Eric Stuart  67 y.o. male  PRE-OPERATIVE DIAGNOSIS:  Stenosis  POST-OPERATIVE DIAGNOSIS:  Stenosis  PROCEDURE:  Procedure(s): Anterior Cervical Decompression Fusion  Cervical three-four, Cervical four-five (N/A)  SURGEON:  Surgeon(s) and Role:    * Julio Sicks, MD - Primary    * Barnett Abu, MD - Assisting  PHYSICIAN ASSISTANT:   ASSISTANTS:    ANESTHESIA:   general  EBL:  50 mL   BLOOD ADMINISTERED:none  DRAINS: none   LOCAL MEDICATIONS USED:  MARCAINE     SPECIMEN:  No Specimen  DISPOSITION OF SPECIMEN:  N/A  COUNTS:  YES  TOURNIQUET:  * No tourniquets in log *  DICTATION: .Dragon Dictation  PLAN OF CARE: Admit for overnight observation  PATIENT DISPOSITION:  PACU - hemodynamically stable.   Delay start of Pharmacological VTE agent (>24hrs) due to surgical blood loss or risk of bleeding: yes

## 2022-09-03 NOTE — Op Note (Signed)
Date of procedure: 09/03/2022  Date of dictation: Same  Service: Neurosurgery  Preoperative diagnosis: Cervical stenosis with myelopathy  Postoperative diagnosis: Same  Procedure Name: C3-4, C4-5 anterior cervical discectomy with interbody fusion utilizing interbody structural allograft and anterior plate instrumentation  Surgeon:Damica Gravlin A.Supreme Rybarczyk, M.D.  Asst. Surgeon: Danielle Dess, MD  Anesthesia: General  Indication: 67 year old male with incidentally discovered critical cervical stenosis with marked signal change secondary to cord compression.  Patient presents now for two-level anterior cervical decompression and fusion in hopes of improving his situation.  Operative note: After induction of anesthesia, patient positioned supine with neck slightly extended and held placeholder traction.  Patient's anterior cervical region prepped and draped sterilely.  Incision made overlying C4.  Dissection performed on the right.  Retractor placed.  Fluoroscopy used.  Level confirmed.  Disc spaces at C3-4 and C4-5 incised.  Discectomy was then performed using various instruments down to the level of the posterior annulus.  The microscope was then brought into the field used throughout the remainder of the discectomies.  Remaining aspects of annulus and osteophytes were removed using high-speed drill down to the level of the posterior longitudinal ligament.  Posterior logical was then elevated and resected.  Underlying thecal sac was identified.  Wide central decompression then performed undercutting the bodies of C4 and C5.  Decompression then proceeded to each neural foramina.  Wide anterior foraminotomies performed on the course exiting C5 nerve roots bilaterally.  At this point a very thorough decompression had been achieved.  There was no evidence of injury to the thecal sac or nerve roots.  Procedures then repeated at C3-4 again without complications.  Wound was then irrigated.  Gelfoam was placed topically for  hemostasis then removed.  6 mm allograft wedges were then impacted into place at both C3-4 and C4-5 where they were impacted until they were slightly recessed from the anterior cortical margins of the vertebral bodies.  Atlantis anterior cervical plate was then placed over the C3-4-5 levels.  This then attached under fluoroscopic guidance using 13 mm variable angle screws to each at all 3 levels.  All 6 screws again the final tightening found to be solidly within the bone.  Locking screws engaged at all levels.  Final images reveal good position of the bone graft and the hardware at the proper operative level with normal alignment of the spine.  Wound was then irrigated.  Hemostasis was assured.  Wound is then closed in layers with Vicryl sutures.  Steri-Strips and sterile dressing were applied.  No apparent complications.  Patient tolerated the procedure well and he returns to the recovery room postop.

## 2022-09-03 NOTE — Progress Notes (Signed)
   09/03/22 1530  Spiritual Encounters  Type of Visit Initial  Care provided to: Family  Referral source IDT Rounds  Reason for visit Surgical  OnCall Visit No  Spiritual Framework  Presenting Themes Meaning/purpose/sources of inspiration;Impactful experiences and emotions;Community and relationships;Other (comment)  Community/Connection Family  Interventions  Spiritual Care Interventions Made Established relationship of care and support;Compassionate presence;Reflective listening;Prayer  Intervention Outcomes  Outcomes Connection to spiritual care;Awareness of support;Reduced anxiety;Patient family open to resources   Met patient's sister Katheran Awe while making rounds in surgical waiting area. Sat and talked to her about her concerns for her brother and how he might react if he woke up and she was not present.  Sister feared that due to his limited understanding, he might think she abandoned him. While in conversation, sister got a text message stating that patient has a room. Sister anxious due to the size of the hospital and possibility of getting loss trying to find her way around. Therefore chaplain escorted her to the front desk to get her temporary visitors identification sticker, and then escorted her to patient's room. Patient was still sleeping. Sister stated that she will be staying the night and has already eaten her supper for the night. Advised that if anything else is needed or require a chaplain for her to notify nurses station and one will come.

## 2022-09-03 NOTE — Progress Notes (Signed)
Orthopedic Tech Progress Note Patient Details:  Eric Stuart 1955-06-20 [id number]  PACU RN called for soft cervical collar. Delivered to bedside. Ortho Devices Type of Ortho Device: Soft collar Ortho Device/Splint Interventions: Floria Raveling 09/03/2022, 2:43 PM

## 2022-09-04 DIAGNOSIS — Z7984 Long term (current) use of oral hypoglycemic drugs: Secondary | ICD-10-CM | POA: Diagnosis not present

## 2022-09-04 DIAGNOSIS — M50021 Cervical disc disorder at C4-C5 level with myelopathy: Secondary | ICD-10-CM | POA: Diagnosis not present

## 2022-09-04 DIAGNOSIS — M6281 Muscle weakness (generalized): Secondary | ICD-10-CM | POA: Diagnosis not present

## 2022-09-04 DIAGNOSIS — R2689 Other abnormalities of gait and mobility: Secondary | ICD-10-CM | POA: Diagnosis not present

## 2022-09-04 DIAGNOSIS — M4802 Spinal stenosis, cervical region: Secondary | ICD-10-CM | POA: Diagnosis not present

## 2022-09-04 DIAGNOSIS — M5001 Cervical disc disorder with myelopathy,  high cervical region: Secondary | ICD-10-CM | POA: Diagnosis not present

## 2022-09-04 DIAGNOSIS — E119 Type 2 diabetes mellitus without complications: Secondary | ICD-10-CM | POA: Diagnosis not present

## 2022-09-04 DIAGNOSIS — Z79899 Other long term (current) drug therapy: Secondary | ICD-10-CM | POA: Diagnosis not present

## 2022-09-04 DIAGNOSIS — R2681 Unsteadiness on feet: Secondary | ICD-10-CM | POA: Diagnosis not present

## 2022-09-04 DIAGNOSIS — Z87891 Personal history of nicotine dependence: Secondary | ICD-10-CM | POA: Diagnosis not present

## 2022-09-04 DIAGNOSIS — F0390 Unspecified dementia without behavioral disturbance: Secondary | ICD-10-CM | POA: Diagnosis not present

## 2022-09-04 DIAGNOSIS — I10 Essential (primary) hypertension: Secondary | ICD-10-CM | POA: Diagnosis not present

## 2022-09-04 LAB — GLUCOSE, CAPILLARY: Glucose-Capillary: 148 mg/dL — ABNORMAL HIGH (ref 70–99)

## 2022-09-04 MED ORDER — HYDROCODONE-ACETAMINOPHEN 5-325 MG PO TABS: 1.0000 | ORAL_TABLET | ORAL | 0 refills | Status: DC | PRN

## 2022-09-04 MED ORDER — CYCLOBENZAPRINE HCL 10 MG PO TABS: 10.0000 mg | ORAL_TABLET | Freq: Three times a day (TID) | ORAL | 0 refills | Status: DC | PRN

## 2022-09-04 MED FILL — Thrombin For Soln 5000 Unit: CUTANEOUS | Qty: 5000 | Status: AC

## 2022-09-04 NOTE — Plan of Care (Signed)

## 2022-09-04 NOTE — Evaluation (Signed)
Occupational Therapy Evaluation Patient Details Name: Eric Stuart MRN: [id number] DOB: January 15, 1956 Today's Date: 09/04/2022   History of Present Illness Pt is a 67 yo male s/p C3-4, C4-5 anterior cervical discectomy with interbody fusion utilizing interbody structural allograft and anterior plate instrumentation due to critical cervical stenosis at C4-5 and also with severe stenosis at C3-4. PHMx:cognitive disability secondary to fetal alcohol syndrome, dementia   Clinical Impression   This 67 yo male admitted and underwent above presents to acute OT with all education completed with pt and sister (primary caregiver) as well as post op cervical handout provided. We will D/C from acute OT.      Recommendations for follow up therapy are one component of a multi-disciplinary discharge planning process, led by the attending physician.  Recommendations may be updated based on patient status, additional functional criteria and insurance authorization.   Assistance Recommended at Discharge Frequent or constant Supervision/Assistance     Functional Status Assessment  Patient has had a recent decline in their functional status and demonstrates the ability to make significant improvements in function in a reasonable and predictable amount of time. (without further need for skilled OT--all education completed and handout provided)  Equipment Recommendations  Tub/shower seat       Precautions / Restrictions Precautions Precautions: Cervical Precaution Booklet Issued: Yes (comment) Required Braces or Orthoses: Cervical Brace Cervical Brace: Soft collar (off for bathing, eating, and mouth care) Restrictions Weight Bearing Restrictions: No      Mobility Bed Mobility Overal bed mobility: Needs Assistance Bed Mobility: Rolling, Sidelying to Sit Rolling: Supervision Sidelying to sit: Supervision       General bed mobility comments: VCs for technique    Transfers Overall transfer level:  Needs assistance Equipment used: Rolling walker (2 wheels) Transfers: Sit to/from Stand Sit to Stand: Min guard                  Balance Overall balance assessment: Needs assistance Sitting-balance support: No upper extremity supported, Feet supported Sitting balance-Leahy Scale: Good     Standing balance support: Bilateral upper extremity supported, Reliant on assistive device for balance Standing balance-Leahy Scale: Poor                             ADL either performed or assessed with clinical judgement   ADL Overall ADL's : Needs assistance/impaired Eating/Feeding: Supervision/ safety;Set up;Sitting Eating/Feeding Details (indicate cue type and reason): can have collar off to eat; educated on the use of straws for all drinks Grooming: Supervision/safety;Set up;Standing Grooming Details (indicate cue type and reason): can have collar off; educated on use of 2 cups for brushing teeth so not as to bend over the sink Upper Body Bathing: Set up;Supervision/ safety;Sitting Upper Body Bathing Details (indicate cue type and reason): can have collar off Lower Body Bathing: Min guard;Sit to/from stand Lower Body Bathing Details (indicate cue type and reason): can have collar off Upper Body Dressing : Supervision/safety;Set up;Sitting Upper Body Dressing Details (indicate cue type and reason): head first then arms to donn; arms then head to donn Lower Body Dressing: Min guard;Sit to/from stand   Toilet Transfer: Min guard;Ambulation;Rolling walker (2 wheels) Toilet Transfer Details (indicate cue type and reason): simulated bed>door with RW>bed Toileting- Clothing Manipulation and Hygiene: Min guard;Sit to/from stand         General ADL Comments: Educated on wear and care of c-collar     Vision Patient Visual Report: No change from  baseline              Pertinent Vitals/Pain Pain Assessment Pain Assessment: Faces Faces Pain Scale: Hurts even more Pain  Location: neck Pain Descriptors / Indicators: Sore Pain Intervention(s): Limited activity within patient's tolerance, Monitored during session, Premedicated before session     Hand Dominance Right   Extremity/Trunk Assessment Upper Extremity Assessment Upper Extremity Assessment: Overall WFL for tasks assessed           Communication Communication Communication: HOH   Cognition Arousal/Alertness: Awake/alert Behavior During Therapy: WFL for tasks assessed/performed Overall Cognitive Status: History of cognitive impairments - at baseline                                                  Home Living Family/patient expects to be discharged to:: Private residence Living Arrangements: Other relatives Available Help at Discharge: Family;Available 24 hours/day Type of Home: House Home Access: Stairs to enter [employer] of Steps: 1 Entrance Stairs-Rails: None Home Layout: Two level;Able to live on main level with bedroom/bathroom Alternate Level Stairs-Number of Steps: 10 Alternate Level Stairs-Rails: Right Bathroom Shower/Tub: Tub/shower unit;Walk-in shower (tub shower downstairs, walk in shower upstairs)   Bathroom Toilet: Standard     Home Equipment: None          Prior Functioning/Environment Prior Level of Function : Independent/Modified Independent               ADLs Comments: All IADLs done for him by sisters        OT Problem List: Decreased range of motion;Impaired balance (sitting and/or standing);Decreased cognition;Decreased knowledge of use of DME or AE;Decreased knowledge of precautions;Pain         OT Goals(Current goals can be found in the care plan section) Acute Rehab OT Goals Patient Stated Goal: to go home today and pain to be better         AM-PAC OT "6 Clicks" Daily Activity     Outcome Measure Help from another person eating meals?: A Little Help from another person taking care of personal grooming?: A  Little Help from another person toileting, which includes using toliet, bedpan, or urinal?: A Little Help from another person bathing (including washing, rinsing, drying)?: A Little Help from another person to put on and taking off regular upper body clothing?: A Little Help from another person to put on and taking off regular lower body clothing?: A Little 6 Click Score: 18   End of Session Equipment Utilized During Treatment: Gait belt;Rolling walker (2 wheels);Cervical collar Nurse Communication:  (pt needs a tub seat)  Activity Tolerance: Patient tolerated treatment well Patient left: with family/visitor present (sitting EOB waiting for PT)  OT Visit Diagnosis: Unsteadiness on feet (R26.81);Other abnormalities of gait and mobility (R26.89);Muscle weakness (generalized) (M62.81);Pain Pain - part of body:  (neck)                Time: 4098-1191 OT Time Calculation (min): 29 min Charges:  OT General Charges $OT Visit: 1 Visit OT Evaluation $OT Eval Moderate Complexity: 1 Mod OT Treatments $Self Care/Home Management : 8-22 mins  Lindon Romp OT Acute Rehabilitation Services Office [phone]    Evette Georges 09/04/2022, 9:42 AM

## 2022-09-04 NOTE — Discharge Summary (Signed)
Physician Discharge Summary  Patient ID: Eric Stuart MRN: [id number] DOB/AGE: July 26, 1955 67 y.o.  Admit date: 09/03/2022 Discharge date: 09/04/2022  Admission Diagnoses:  Discharge Diagnoses:  Principal Problem:   Stenosis of cervical spine with myelopathy [name])   Discharged Condition: good  Hospital Course: Patient admitted to the hospital where he underwent uncomplicated two-level anterior cervical decompression and fusion surgery for treatment of his severe compressive 3.  Postoperative doing well.  Standing ambulating and voiding without difficulty.  Swallowing well.  Voice strong.  Ready for discharge home.  Consults:   Significant Diagnostic Studies:   Treatments:   Discharge Exam: Blood pressure 118/87, pulse 81, temperature 98.2 F (36.8 C), temperature source Oral, resp. rate 16, height 5\' 5"  (1.651 m), weight 56 kg, SpO2 96 %. Awake and alert.  Oriented at baseline.  Speech fluent.  Motor and sensory function stable from preop-slight disc.  Wound clean and dry.  Chest and abdomen benign.  Disposition: Discharge disposition: 01-Home or Self Care        Allergies as of 09/04/2022   No Known Allergies      Medication List     TAKE these medications    amLODipine 10 MG tablet Commonly known as: NORVASC Take 1 tablet (10 mg total) by mouth daily.   cyanocobalamin 1000 MCG tablet Commonly known as: VITAMIN B12 Take 1,000 mcg by mouth daily.   cyclobenzaprine 10 MG tablet Commonly known as: FLEXERIL Take 1 tablet (10 mg total) by mouth 3 (three) times daily as needed for muscle spasms.   diclofenac 75 MG EC tablet Commonly known as: VOLTAREN Take 1 tablet (75 mg total) by mouth 2 (two) times daily.   donepezil 10 MG tablet Commonly known as: ARICEPT Take 1 tablet (10 mg total) by mouth at bedtime.   HYDROcodone-acetaminophen 5-325 MG tablet Commonly known as: NORCO/VICODIN Take 1 tablet by mouth every 4 (four) hours as needed for moderate pain  ((score 4 to 6)).   memantine 10 MG tablet Commonly known as: NAMENDA Take 10 mg by mouth 2 (two) times daily.   metFORMIN 1000 MG tablet Commonly known as: GLUCOPHAGE Take 0.5 tablets (500 mg total) by mouth 2 (two) times daily with a meal.   olmesartan-hydrochlorothiazide 40-25 MG tablet Commonly known as: BENICAR HCT Take 1 tablet by mouth daily.   rosuvastatin 10 MG tablet Commonly known as: CRESTOR Take 1 tablet (10 mg total) by mouth daily.         Signed: Kathaleen Maser [name] 09/04/2022, 8:17 AM

## 2022-09-04 NOTE — Evaluation (Signed)
Physical Therapy Evaluation Patient Details Name: Eric Stuart MRN: [id number] DOB: 1955/12/11 Today's Date: 09/04/2022  History of Present Illness  Pt is a 67 yo male s/p C3-4, C4-5 anterior cervical discectomy with interbody fusion (09/03/2022). PMH significant for Dementia, Fetal alcohol syndrome, DM, HLD, HTN.   Clinical Impression  Upon evaluation, pt received sitting upright EOB alert and oriented with family present. Prior to cervical fusion, pt was independent with all functional mobility; able to bike at least 20 miles throughout the community. Pt lives in a multi level home with one step to enter and multiple family relatives to assist when needed. Currently, able to perform transfers without physical assistance; required a verbal cue for hand placement. Gait training initially presented with slowed velocity and step to pattern with left LE; improved with longer ambulation and verbal cue to advance left foot past other foot. Reported minor SOB after gait training; cured for pursed lip breathing and O2 remained stable after activity (90-95%). Recommending discharge to home with family support as needed.      Recommendations for follow up therapy are one component of a multi-disciplinary discharge planning process, led by the attending physician.  Recommendations may be updated based on patient status, additional functional criteria and insurance authorization.  Follow Up Recommendations       Assistance Recommended at Discharge Intermittent Supervision/Assistance  Patient can return home with the following  A little help with walking and/or transfers;A little help with bathing/dressing/bathroom;Assist for transportation;Help with stairs or ramp for entrance    Equipment Recommendations Rolling walker (2 wheels);Other (comment) (Youth Walker, Tub Bench)  Recommendations for Other Services       Functional Status Assessment Patient has had a recent decline in their functional status  and demonstrates the ability to make significant improvements in function in a reasonable and predictable amount of time.     Precautions / Restrictions Precautions Precautions: Cervical Precaution Booklet Issued: Yes (comment) Required Braces or Orthoses: Cervical Brace Cervical Brace: Soft collar Restrictions Weight Bearing Restrictions: No      Mobility  Bed Mobility               General bed mobility comments: Received sitting EOB    Transfers Overall transfer level: Needs assistance Equipment used: Rolling walker (2 wheels) Transfers: Sit to/from Stand Sit to Stand: Min guard           General transfer comment: Performed  sit to stand transfer forward lean; verbal cue provided for hand placement on RW and EOB    Ambulation/Gait Ambulation/Gait assistance: Min guard Gait Distance (Feet): 100 Feet Assistive device: Rolling walker (2 wheels) Gait Pattern/deviations: Step-to pattern, Decreased step length - right, Decreased step length - left, Decreased stride length, Trunk flexed, Step-through pattern, Decreased dorsiflexion - left Gait velocity: Decreased Gait velocity interpretation: <1.31 ft/sec, indicative of household ambulator   General Gait Details: Initial gait pattern presented with slowed velocity, step to pattern and low foot clearance with left foot; velocity and pattern improved with prolonged ambulation and verbal cue for step through with Left foot. O2 monitored after ambulation (90-95% after activity)  Stairs Stairs: Yes Stairs assistance: Min guard Stair Management: One rail Left, One rail Right, Forwards Number of Stairs: 1 General stair comments: Performed one step to simulate entry to home, able to perform with rail assistance.  Wheelchair Mobility    Modified Rankin (Stroke Patients Only)       Balance Overall balance assessment: Needs assistance Sitting-balance support: No upper extremity supported, Feet  supported Sitting  balance-Leahy Scale: Good     Standing balance support: Bilateral upper extremity supported, Reliant on assistive device for balance Standing balance-Leahy Scale: Fair                               Pertinent Vitals/Pain Pain Assessment Pain Assessment: PAINAD Breathing: normal Negative Vocalization: none Facial Expression: smiling or inexpressive Body Language: relaxed Consolability: no need to console PAINAD Score: 0 Pain Location: Neck Pain Descriptors / Indicators: Sore Pain Intervention(s): Limited activity within patient's tolerance, Monitored during session    Home Living Family/patient expects to be discharged to:: Private residence Living Arrangements: Other relatives Available Help at Discharge: Family;Available 24 hours/day Type of Home: House Home Access: Stairs to enter Entrance Stairs-Rails: None Entrance Stairs-Number of Steps: 1 Alternate Level Stairs-Number of Steps: 10 Home Layout: Two level;Able to live on main level with bedroom/bathroom Home Equipment: None      Prior Function Prior Level of Function : Independent/Modified Independent             Mobility Comments: Pt able to perform all functional mobility independently, able to bike ~20 mi around the community. ADLs Comments: All IADLs done for him by sisters     Hand Dominance   Dominant Hand: Right    Extremity/Trunk Assessment   Upper Extremity Assessment Upper Extremity Assessment: Defer to OT evaluation    Lower Extremity Assessment Lower Extremity Assessment: Overall WFL for tasks assessed    Cervical / Trunk Assessment Cervical / Trunk Assessment: Neck Surgery  Communication   Communication: HOH  Cognition Arousal/Alertness: Awake/alert Behavior During Therapy: WFL for tasks assessed/performed Overall Cognitive Status: History of cognitive impairments - at baseline                                          General Comments      Exercises      Assessment/Plan    PT Assessment Patient does not need any further PT services  PT Problem List         PT Treatment Interventions      PT Goals (Current goals can be found in the Care Plan section)  Acute Rehab PT Goals Patient Stated Goal: Pt and family would like for patient to return home to baseline mobility PT Goal Formulation: With patient/family Time For Goal Achievement: 09/11/22 Potential to Achieve Goals: Good    Frequency       Co-evaluation               AM-PAC PT "6 Clicks" Mobility  Outcome Measure Help needed turning from your back to your side while in a flat bed without using bedrails?: A Little Help needed moving from lying on your back to sitting on the side of a flat bed without using bedrails?: A Little Help needed moving to and from a bed to a chair (including a wheelchair)?: A Little Help needed standing up from a chair using your arms (e.g., wheelchair or bedside chair)?: A Little Help needed to walk in hospital room?: A Little Help needed climbing 3-5 steps with a railing? : A Little 6 Click Score: 18    End of Session Equipment Utilized During Treatment: Gait belt;Cervical collar Activity Tolerance: Patient tolerated treatment well Patient left: in chair;with call bell/phone within reach;with family/visitor present Nurse Communication: Mobility status PT  Visit Diagnosis: Muscle weakness (generalized) (M62.81);Pain Pain - Right/Left: Right Pain - part of body:  (Neck, incision site)    Time: 1610-9604 PT Time Calculation (min) (ACUTE ONLY): 14 min   Charges:   PT Evaluation $PT Eval Low Complexity: 1 Low          Christene Lye, SPT Acute Rehabilitation Services [phone] Secure chat preferred    Christene Lye 09/04/2022, 10:40 AM

## 2022-09-04 NOTE — Discharge Instructions (Addendum)
Wound Care Keep incision area dry.  You may  shower.  Do not put any creams, lotions, or ointments on incision. Leave steri-strips on neck.  They will fall off by themselves. Activity Walk each and every day, increasing distance each day. No lifting greater than 8 lbs.  Avoid excessive neck motion. No driving for 2 weeks; may ride as a passenger locally. Wear neck brace at all times except when showering. Diet Resume your normal diet.   Call Your Doctor If Any of These Occur Redness, drainage, or swelling at the wound.  Temperature greater than 101 degrees. Severe pain not relieved by pain medication. Increased difficulty swallowing.  Incision starts to come apart. Follow Up Appt Call  [phone]) for any problems.  If you have any hardware placed in your spine, you will need an x-ray before your appointment.

## 2022-09-04 NOTE — Progress Notes (Signed)
Patient alert and oriented, mae's well, voiding adequate amount of urine, swallowing without difficulty, no c/o pain at time of discharge. Patient discharged home with family. Script and discharged instructions given to patient. Patient and sister stated understanding of instructions given. Patient has an appointment with Dr. Pool 

## 2022-09-05 ENCOUNTER — Encounter (HOSPITAL_COMMUNITY): Payer: Self-pay | Admitting: Neurosurgery

## 2022-09-09 ENCOUNTER — Ambulatory Visit (INDEPENDENT_AMBULATORY_CARE_PROVIDER_SITE_OTHER): Payer: Medicare HMO | Admitting: Family

## 2022-09-09 ENCOUNTER — Encounter: Payer: Self-pay | Admitting: Family

## 2022-09-09 VITALS — BP 129/84 | HR 77 | Temp 97.2°F | Ht 65.0 in | Wt 115.4 lb

## 2022-09-09 DIAGNOSIS — G992 Myelopathy in diseases classified elsewhere: Secondary | ICD-10-CM | POA: Diagnosis not present

## 2022-09-09 DIAGNOSIS — E1159 Type 2 diabetes mellitus with other circulatory complications: Secondary | ICD-10-CM | POA: Diagnosis not present

## 2022-09-09 DIAGNOSIS — E1169 Type 2 diabetes mellitus with other specified complication: Secondary | ICD-10-CM | POA: Diagnosis not present

## 2022-09-09 DIAGNOSIS — F0280 Dementia in other diseases classified elsewhere without behavioral disturbance: Secondary | ICD-10-CM

## 2022-09-09 DIAGNOSIS — E785 Hyperlipidemia, unspecified: Secondary | ICD-10-CM

## 2022-09-09 DIAGNOSIS — Z0000 Encounter for general adult medical examination without abnormal findings: Secondary | ICD-10-CM

## 2022-09-09 DIAGNOSIS — Z0001 Encounter for general adult medical examination with abnormal findings: Secondary | ICD-10-CM | POA: Diagnosis not present

## 2022-09-09 DIAGNOSIS — I152 Hypertension secondary to endocrine disorders: Secondary | ICD-10-CM | POA: Diagnosis not present

## 2022-09-09 DIAGNOSIS — G309 Alzheimer's disease, unspecified: Secondary | ICD-10-CM

## 2022-09-09 DIAGNOSIS — M4802 Spinal stenosis, cervical region: Secondary | ICD-10-CM | POA: Diagnosis not present

## 2022-09-09 LAB — BAYER DCA HB A1C WAIVED: HB A1C (BAYER DCA - WAIVED): 6.7 % — ABNORMAL HIGH (ref 4.8–5.6)

## 2022-09-09 NOTE — Patient Instructions (Signed)
Health Maintenance After Age 65 After age 65, you are at a higher risk for certain long-term diseases and infections as well as injuries from falls. Falls are a major cause of broken bones and head injuries in people who are older than age 65. Getting regular preventive care can help to keep you healthy and well. Preventive care includes getting regular testing and making lifestyle changes as recommended by your health care provider. Talk with your health care provider about: Which screenings and tests you should have. A screening is a test that checks for a disease when you have no symptoms. A diet and exercise plan that is right for you. What should I know about screenings and tests to prevent falls? Screening and testing are the best ways to find a health problem early. Early diagnosis and treatment give you the best chance of managing medical conditions that are common after age 65. Certain conditions and lifestyle choices may make you more likely to have a fall. Your health care provider may recommend: Regular vision checks. Poor vision and conditions such as cataracts can make you more likely to have a fall. If you wear glasses, make sure to get your prescription updated if your vision changes. Medicine review. Work with your health care provider to regularly review all of the medicines you are taking, including over-the-counter medicines. Ask your health care provider about any side effects that may make you more likely to have a fall. Tell your health care provider if any medicines that you take make you feel dizzy or sleepy. Strength and balance checks. Your health care provider may recommend certain tests to check your strength and balance while standing, walking, or changing positions. Foot health exam. Foot pain and numbness, as well as not wearing proper footwear, can make you more likely to have a fall. Screenings, including: Osteoporosis screening. Osteoporosis is a condition that causes  the bones to get weaker and break more easily. Blood pressure screening. Blood pressure changes and medicines to control blood pressure can make you feel dizzy. Depression screening. You may be more likely to have a fall if you have a fear of falling, feel depressed, or feel unable to do activities that you used to do. Alcohol use screening. Using too much alcohol can affect your balance and may make you more likely to have a fall. Follow these instructions at home: Lifestyle Do not drink alcohol if: Your health care provider tells you not to drink. If you drink alcohol: Limit how much you have to: 0-1 drink a day for women. 0-2 drinks a day for men. Know how much alcohol is in your drink. In the [place] one drink equals one 12 oz bottle of beer (355 mL), one 5 oz glass of wine (148 mL), or one 1 oz glass of hard liquor (44 mL). Do not use any products that contain nicotine or tobacco. These products include cigarettes, chewing tobacco, and vaping devices, such as e-cigarettes. If you need help quitting, ask your health care provider. Activity  Follow a regular exercise program to stay fit. This will help you maintain your balance. Ask your health care provider what types of exercise are appropriate for you. If you need a cane or walker, use it as recommended by your health care provider. Wear supportive shoes that have nonskid soles. Safety  Remove any tripping hazards, such as rugs, cords, and clutter. Install safety equipment such as grab bars in bathrooms and safety rails on stairs. Keep rooms and walkways   well-lit. General instructions Talk with your health care provider about your risks for falling. Tell your health care provider if: You fall. Be sure to tell your health care provider about all falls, even ones that seem minor. You feel dizzy, tiredness (fatigue), or off-balance. Take over-the-counter and prescription medicines only as told by your health care provider. These include  supplements. Eat a healthy diet and maintain a healthy weight. A healthy diet includes low-fat dairy products, low-fat (lean) meats, and fiber from whole grains, beans, and lots of fruits and vegetables. Stay current with your vaccines. Schedule regular health, dental, and eye exams. Summary Having a healthy lifestyle and getting preventive care can help to protect your health and wellness after age 65. Screening and testing are the best way to find a health problem early and help you avoid having a fall. Early diagnosis and treatment give you the best chance for managing medical conditions that are more common for people who are older than age 65. Falls are a major cause of broken bones and head injuries in people who are older than age 65. Take precautions to prevent a fall at home. Work with your health care provider to learn what changes you can make to improve your health and wellness and to prevent falls. This information is not intended to replace advice given to you by your health care provider. Make sure you discuss any questions you have with your health care provider. Document Revised: 08/07/2020 Document Reviewed: 08/07/2020 Elsevier Patient Education  2024 [employer]  

## 2022-09-09 NOTE — Progress Notes (Signed)
Subjective:    Patient ID: Eric Stuart, male    DOB: 1956-01-28, 67 y.o.   MRN: [id number]  Chief Complaint  Patient presents with   Medical Management of Chronic Issues    SPINAL SURGERY LAST WEEK.   Pt presents to the office today for CPE and chronic follow up.   Pt has hx sarcoidosis. He is followed by Neurologists for Alzheimer's Disease. He was started on Aricept 10 mg and namenda 5 mg BID. Sister states this has helped slightly. She states some days are better than others.   He had cervical decompression on 09/03/22. Reports he doing well.   Hypertension This is a chronic problem. The current episode started more than 1 year ago. The problem has been resolved since onset. The problem is controlled. Associated symptoms include malaise/fatigue. Pertinent negatives include no blurred vision, peripheral edema or shortness of breath. Risk factors for coronary artery disease include stress and dyslipidemia. The current treatment provides moderate improvement.  Hyperlipidemia This is a chronic problem. The current episode started more than 1 year ago. The problem is controlled. Recent lipid tests were reviewed and are normal. Pertinent negatives include no shortness of breath. Current antihyperlipidemic treatment includes statins. The current treatment provides moderate improvement of lipids. Risk factors for coronary artery disease include dyslipidemia, hypertension, male sex and a sedentary lifestyle.  Diabetes He presents for his follow-up diabetic visit. He has type 2 diabetes mellitus. Pertinent negatives for diabetes include no blurred vision and no foot paresthesias. Risk factors for coronary artery disease include diabetes mellitus, dyslipidemia, hypertension, male sex and sedentary lifestyle. He is following a generally healthy diet.      Review of Systems  Constitutional:  Positive for malaise/fatigue.  Eyes:  Negative for blurred vision.  Respiratory:  Negative for shortness  of breath.   All other systems reviewed and are negative.  Family History  Problem Relation Age of Onset   Cancer Mother        cervical   Peripheral Artery Disease Father    Heart failure Father    Diabetes Sister    Heart attack Brother    Heart attack Sister    Stroke Sister    Colon cancer Neg Hx    Colon polyps Neg Hx    Esophageal cancer Neg Hx    Rectal cancer Neg Hx    Stomach cancer Neg Hx    Social History   Socioeconomic History   Marital status: Single    Spouse name: Not on file   Number of children: 0   Years of education: 12   Highest education level: 12th grade  Occupational History   Occupation: disabled  Tobacco Use   Smoking status: Former    Packs/day: 2.00    Years: 4.00    Additional pack years: 0.00    Total pack years: 8.00    Types: Cigarettes    Quit date: 09/29/2013    Years since quitting: 8.9   Smokeless tobacco: Never  Vaping Use   Vaping Use: Never used  Substance and Sexual Activity   Alcohol use: No    Alcohol/week: 0.0 standard drinks of alcohol   Drug use: No   Sexual activity: Not Currently  Other Topics Concern   Not on file  Social History Narrative   Patient is disabled and lives at home with his sister. He is very active and walks or bicycles around town. He does this daily and probably averages about 50 miles a week on  his bike. He is not married and does not have any children.    Are you right handed or left handed? left   Are you currently employed ? 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    What type of home do you live in: 1 story or 2 story? two   Caffeine 2 cups day     Social Determinants of Health   Financial Resource Strain: Low Risk  (03/20/2022)   Overall Financial Resource Strain (CARDIA)    Difficulty of Paying Living Expenses: Not hard at all  Food Insecurity: No Food Insecurity (03/20/2022)   Hunger Vital Sign    Worried About Running Out of  Food in the Last Year: Never true    Ran Out of Food in the Last Year: Never true  Transportation Needs: No Transportation Needs (03/20/2022)   PRAPARE - Administrator, Civil Service (Medical): No    Lack of Transportation (Non-Medical): No  Physical Activity: Sufficiently Active (03/20/2022)   Exercise Vital Sign    Days of Exercise per Week: 5 days    Minutes of Exercise per Session: 30 min  Stress: No Stress Concern Present (03/20/2022)   [employer] - Occupational Stress Questionnaire    Feeling of Stress : Not at all  Social Connections: Moderately Isolated (03/20/2022)   Social Connection and Isolation Panel [NHANES]    Frequency of Communication with Friends and Family: More than three times a week    Frequency of Social Gatherings with Friends and Family: Three times a week    Attends Religious Services: More than 4 times per year    Active Member of Clubs or Organizations: No    Attends Banker Meetings: Never    Marital Status: Never married       Objective:   Physical Exam Vitals reviewed.  Constitutional:      General: He is not in acute distress.    Appearance: He is well-developed.  HENT:     Head: Normocephalic.  Eyes:     General:        Right eye: No discharge.        Left eye: No discharge.     Pupils: Pupils are equal, round, and reactive to light.  Neck:     Thyroid: No thyromegaly.  Cardiovascular:     Rate and Rhythm: Normal rate and regular rhythm.     Heart sounds: Normal heart sounds. No murmur heard. Pulmonary:     Effort: Pulmonary effort is normal. No respiratory distress.     Breath sounds: Normal breath sounds. No wheezing.  Abdominal:     General: Bowel sounds are normal. There is no distension.     Palpations: Abdomen is soft.     Tenderness: There is no abdominal tenderness.  Musculoskeletal:        General: No tenderness.     Cervical back: Normal range of motion and neck supple.      Comments: Neck in soft neck brace. No pain present  Skin:    General: Skin is warm and dry.     Findings: No erythema or rash.  Neurological:     Mental Status: He is alert and oriented to person, place, and time.     Cranial Nerves: No cranial nerve deficit.     Deep Tendon Reflexes: Reflexes are normal and symmetric.  Psychiatric:  Behavior: Behavior normal.        Thought Content: Thought content normal.        Judgment: Judgment normal.          BP (!) 138/98   Pulse 100   Temp (!) 97.2 F (36.2 C) (Temporal)   Ht 5\' 5"  (1.651 m)   Wt 115 lb 6.4 oz (52.3 kg)   SpO2 96%   BMI 19.20 kg/m   Assessment & Plan:  Tyren Fulmer comes in today with chief complaint of Medical Management of Chronic Issues (SPINAL SURGERY LAST WEEK.)   Diagnosis and orders addressed:  1. Annual physical exam - CMP14+EGFR - CBC with Differential/Platelet - Lipid panel - TSH - Bayer DCA Hb A1c Waived - PSA, total and free  2. Alzheimer's disease (HCC) - CMP14+EGFR - CBC with Differential/Platelet  3. Type 2 diabetes mellitus with other specified complication, without long-term current use of insulin (HCC) - CMP14+EGFR - CBC with Differential/Platelet - Bayer DCA Hb A1c Waived  4. Hyperlipidemia due to type 2 diabetes mellitus (HCC) - CMP14+EGFR - CBC with Differential/Platelet  5. Hypertension associated with diabetes (HCC) - CMP14+EGFR - CBC with Differential/Platelet   6. Stenosis of cervical spine with myelopathy [facility]) Keep surgeon appointment    Labs pending Health Maintenance reviewed Diet and exercise encouraged  Follow up plan: 4 months    Jannifer Rodney, FNP

## 2022-09-10 ENCOUNTER — Encounter (HOSPITAL_COMMUNITY): Payer: Self-pay | Admitting: Neurosurgery

## 2022-09-10 LAB — CMP14+EGFR
ALT: 7 IU/L (ref 0–44)
AST: 14 IU/L (ref 0–40)
Albumin/Globulin Ratio: 1.3
Albumin: 4.4 g/dL (ref 3.9–4.9)
Alkaline Phosphatase: 81 IU/L (ref 44–121)
BUN/Creatinine Ratio: 22 (ref 10–24)
BUN: 33 mg/dL — ABNORMAL HIGH (ref 8–27)
Bilirubin Total: 0.4 mg/dL (ref 0.0–1.2)
CO2: 24 mmol/L (ref 20–29)
Calcium: 10.5 mg/dL — ABNORMAL HIGH (ref 8.6–10.2)
Chloride: 100 mmol/L (ref 96–106)
Creatinine, Ser: 1.47 mg/dL — ABNORMAL HIGH (ref 0.76–1.27)
Globulin, Total: 3.5 g/dL (ref 1.5–4.5)
Glucose: 129 mg/dL — ABNORMAL HIGH (ref 70–99)
Potassium: 4.5 mmol/L (ref 3.5–5.2)
Sodium: 141 mmol/L (ref 134–144)
Total Protein: 7.9 g/dL (ref 6.0–8.5)
eGFR: 52 mL/min/{1.73_m2} — ABNORMAL LOW (ref >59)

## 2022-09-10 LAB — CBC WITH DIFFERENTIAL/PLATELET
Basophils Absolute: 0 10*3/uL (ref 0.0–0.2)
Basos: 1 %
EOS (ABSOLUTE): 0.2 10*3/uL (ref 0.0–0.4)
Eos: 4 %
Hematocrit: 41.3 % (ref 37.5–51.0)
Hemoglobin: 13.3 g/dL (ref 13.0–17.7)
Immature Grans (Abs): 0 10*3/uL (ref 0.0–0.1)
Immature Granulocytes: 0 %
Lymphocytes Absolute: 1.4 10*3/uL (ref 0.7–3.1)
Lymphs: 23 %
MCH: 26.7 pg (ref 26.6–33.0)
MCHC: 32.2 g/dL (ref 31.5–35.7)
MCV: 83 fL (ref 79–97)
Monocytes Absolute: 0.9 10*3/uL (ref 0.1–0.9)
Monocytes: 14 %
Neutrophils Absolute: 3.7 10*3/uL (ref 1.4–7.0)
Neutrophils: 58 %
Platelets: 229 10*3/uL (ref 150–450)
RBC: 4.98 x10E6/uL (ref 4.14–5.80)
RDW: 15.3 % (ref 11.6–15.4)
WBC: 6.3 10*3/uL (ref 3.4–10.8)

## 2022-09-10 LAB — LIPID PANEL
Chol/HDL Ratio: 2.9 ratio (ref 0.0–5.0)
Cholesterol, Total: 159 mg/dL (ref 100–199)
HDL: 54 mg/dL (ref >39)
LDL Chol Calc (NIH): 82 mg/dL (ref 0–99)
Triglycerides: 129 mg/dL (ref 0–149)
VLDL Cholesterol Cal: 23 mg/dL (ref 5–40)

## 2022-09-10 LAB — TSH: TSH: 1.37 u[IU]/mL (ref 0.450–4.500)

## 2022-09-10 LAB — PSA, TOTAL AND FREE
PSA, Free Pct: 17.1 %
PSA, Free: 0.24 ng/mL
Prostate Specific Ag, Serum: 1.4 ng/mL (ref 0.0–4.0)

## 2022-09-14 ENCOUNTER — Other Ambulatory Visit: Payer: Self-pay | Admitting: Family

## 2022-09-14 DIAGNOSIS — E1159 Type 2 diabetes mellitus with other circulatory complications: Secondary | ICD-10-CM

## 2022-09-14 DIAGNOSIS — E1169 Type 2 diabetes mellitus with other specified complication: Secondary | ICD-10-CM

## 2022-09-20 ENCOUNTER — Other Ambulatory Visit: Payer: Self-pay | Admitting: Family Medicine

## 2022-10-16 DIAGNOSIS — G992 Myelopathy in diseases classified elsewhere: Secondary | ICD-10-CM | POA: Diagnosis not present

## 2022-10-16 DIAGNOSIS — M542 Cervicalgia: Secondary | ICD-10-CM | POA: Diagnosis not present

## 2022-10-28 DIAGNOSIS — G309 Alzheimer's disease, unspecified: Secondary | ICD-10-CM | POA: Diagnosis not present

## 2022-10-28 DIAGNOSIS — Z008 Encounter for other general examination: Secondary | ICD-10-CM | POA: Diagnosis not present

## 2022-10-28 DIAGNOSIS — Z7984 Long term (current) use of oral hypoglycemic drugs: Secondary | ICD-10-CM | POA: Diagnosis not present

## 2022-10-28 DIAGNOSIS — E119 Type 2 diabetes mellitus without complications: Secondary | ICD-10-CM | POA: Diagnosis not present

## 2022-10-28 DIAGNOSIS — Z87891 Personal history of nicotine dependence: Secondary | ICD-10-CM | POA: Diagnosis not present

## 2022-10-28 DIAGNOSIS — F79 Unspecified intellectual disabilities: Secondary | ICD-10-CM | POA: Diagnosis not present

## 2022-10-28 DIAGNOSIS — M4800 Spinal stenosis, site unspecified: Secondary | ICD-10-CM | POA: Diagnosis not present

## 2022-10-28 DIAGNOSIS — Z973 Presence of spectacles and contact lenses: Secondary | ICD-10-CM | POA: Diagnosis not present

## 2022-10-28 DIAGNOSIS — I2510 Atherosclerotic heart disease of native coronary artery without angina pectoris: Secondary | ICD-10-CM | POA: Diagnosis not present

## 2022-10-28 DIAGNOSIS — I10 Essential (primary) hypertension: Secondary | ICD-10-CM | POA: Diagnosis not present

## 2022-10-28 DIAGNOSIS — H9193 Unspecified hearing loss, bilateral: Secondary | ICD-10-CM | POA: Diagnosis not present

## 2022-10-28 DIAGNOSIS — F02A0 Dementia in other diseases classified elsewhere, mild, without behavioral disturbance, psychotic disturbance, mood disturbance, and anxiety: Secondary | ICD-10-CM | POA: Diagnosis not present

## 2022-11-14 DIAGNOSIS — G992 Myelopathy in diseases classified elsewhere: Secondary | ICD-10-CM | POA: Diagnosis not present

## 2022-11-14 DIAGNOSIS — M4802 Spinal stenosis, cervical region: Secondary | ICD-10-CM | POA: Diagnosis not present

## 2022-11-14 DIAGNOSIS — M5001 Cervical disc disorder with myelopathy,  high cervical region: Secondary | ICD-10-CM | POA: Diagnosis not present

## 2022-11-26 DIAGNOSIS — R2681 Unsteadiness on feet: Secondary | ICD-10-CM | POA: Diagnosis not present

## 2022-11-26 DIAGNOSIS — M6281 Muscle weakness (generalized): Secondary | ICD-10-CM | POA: Diagnosis not present

## 2022-11-26 DIAGNOSIS — R2689 Other abnormalities of gait and mobility: Secondary | ICD-10-CM | POA: Diagnosis not present

## 2022-12-16 ENCOUNTER — Ambulatory Visit: Payer: Medicare HMO

## 2022-12-16 NOTE — Progress Notes (Signed)
Unable to perform eye exam due to eye not dilating

## 2022-12-23 ENCOUNTER — Ambulatory Visit (INDEPENDENT_AMBULATORY_CARE_PROVIDER_SITE_OTHER): Payer: Medicare HMO | Admitting: Physician Assistant

## 2022-12-23 ENCOUNTER — Encounter: Payer: Self-pay | Admitting: Physician Assistant

## 2022-12-23 VITALS — BP 140/84 | HR 77 | Resp 18 | Ht 65.0 in | Wt 126.0 lb

## 2022-12-23 DIAGNOSIS — F70 Mild intellectual disabilities: Secondary | ICD-10-CM

## 2022-12-23 DIAGNOSIS — F0280 Dementia in other diseases classified elsewhere without behavioral disturbance: Secondary | ICD-10-CM

## 2022-12-23 DIAGNOSIS — G309 Alzheimer's disease, unspecified: Secondary | ICD-10-CM

## 2022-12-23 NOTE — Patient Instructions (Addendum)
It was a pleasure to see you today at our office.   Recommendations:  Follow up in  6 months Continue donepezil 10 mg daily.   Continue Memantine 10 mg twice daily.  Increase walking      Whom to call:  Memory  decline, memory medications: Call our office [phone]   For psychiatric meds, mood meds: Please have your primary care physician manage these medications.    For assessment of decision of mental capacity and competency:  Call Dr. Erick Blinks, geriatric psychiatrist at [phone]  For guidance in geriatric dementia issues please call [facility] [phone]  For guidance regarding [facility] and if placement were needed at the facility, contact Sidney Ace, Social Worker tel: [phone]  If you have any severe symptoms of a stroke, or other severe issues such as confusion,severe chills or fever, etc call 911 or go to the ER as you may need to be evaluated further    Feel free to go to the following database for funded clinical studies conducted around the world: RankChecks.se   https://www.triadclinicaltrials.com/     RECOMMENDATIONS FOR ALL PATIENTS WITH MEMORY PROBLEMS: 1. Continue to exercise (Recommend 30 minutes of walking everyday, or 3 hours every week) 2. Increase social interactions - continue going to [place] and enjoy social gatherings with friends and family 3. Eat healthy, avoid fried foods and eat more fruits and vegetables 4. Maintain adequate blood pressure, blood sugar, and blood cholesterol level. Reducing the risk of stroke and cardiovascular disease also helps promoting better memory. 5. Avoid stressful situations. Live a simple life and avoid aggravations. Organize your time and prepare for the next day in anticipation. 6. Sleep well, avoid any interruptions of sleep and avoid any distractions in the bedroom that may interfere with adequate sleep quality 7. Avoid sugar, avoid sweets as there  is a strong link between excessive sugar intake, diabetes, and cognitive impairment We discussed the Mediterranean diet, which has been shown to help patients reduce the risk of progressive memory disorders and reduces cardiovascular risk. This includes eating fish, eat fruits and green leafy vegetables, nuts like almonds and hazelnuts, walnuts, and also use olive oil. Avoid fast foods and fried foods as much as possible. Avoid sweets and sugar as sugar use has been linked to worsening of memory function.  There is always a concern of gradual progression of memory problems. If this is the case, then we may need to adjust level of care according to patient needs. Support, both to the patient and caregiver, should then be put into place.    The Alzheimer's Association is here all day, every day for people facing Alzheimer's disease through our free 24/7 Helpline: 8436267947. The Helpline provides reliable information and support to all those who need assistance, such as individuals living with memory loss, Alzheimer's or other dementia, caregivers, health care professionals and the public.  Our highly trained and knowledgeable staff can help you with: Understanding memory loss, dementia and Alzheimer's  Medications and other treatment options  General information about aging and brain health  Skills to provide quality care and to find the best care from professionals  Legal, financial and living-arrangement decisions Our Helpline also features: Confidential care consultation provided by master's level clinicians who can help with decision-making support, crisis assistance and education on issues families face every day  Help in a caller's preferred language using our translation service that features more than 200 languages and dialects  Referrals to local community  programs, services and ongoing support     FALL PRECAUTIONS: Be cautious when walking. Scan the area for obstacles that may  increase the risk of trips and falls. When getting up in the mornings, sit up at the edge of the bed for a few minutes before getting out of bed. Consider elevating the bed at the head end to avoid drop of blood pressure when getting up. Walk always in a well-lit room (use night lights in the walls). Avoid area rugs or power cords from appliances in the middle of the walkways. Use a walker or a cane if necessary and consider physical therapy for balance exercise. Get your eyesight checked regularly.  FINANCIAL OVERSIGHT: Supervision, especially oversight when making financial decisions or transactions is also recommended.  HOME SAFETY: Consider the safety of the kitchen when operating appliances like stoves, microwave oven, and blender. Consider having supervision and share cooking responsibilities until no longer able to participate in those. Accidents with firearms and other hazards in the house should be identified and addressed as well.   ABILITY TO BE LEFT ALONE: If patient is unable to contact 911 operator, consider using LifeLine, or when the need is there, arrange for someone to stay with patients. Smoking is a fire hazard, consider supervision or cessation. Risk of wandering should be assessed by caregiver and if detected at any point, supervision and safe proof recommendations should be instituted.  MEDICATION SUPERVISION: Inability to self-administer medication needs to be constantly addressed. Implement a mechanism to ensure safe administration of the medications.   DRIVING: Regarding driving, in patients with progressive memory problems, driving will be impaired. We advise to have someone else do the driving if trouble finding directions or if minor accidents are reported. Independent driving assessment is available to determine safety of driving.   If you are interested in the driving assessment, you can contact the following:  [facility] [employer] [facility] in [place]  [phone]  [facility] [phone]  [facility] [phone] or [phone]      Mediterranean Diet A Mediterranean diet refers to food and lifestyle choices that are based on the traditions of countries located on the [employer]. This way of eating has been shown to help prevent certain conditions and improve outcomes for people who have chronic diseases, like kidney disease and heart disease. What are tips for following this plan? Lifestyle  Cook and eat meals together with your family, when possible. Drink enough fluid to keep your urine clear or pale yellow. Be physically active every day. This includes: Aerobic exercise like running or swimming. Leisure activities like gardening, walking, or housework. Get 7-8 hours of sleep each night. If recommended by your health care provider, drink red wine in moderation. This means 1 glass a day for nonpregnant women and 2 glasses a day for men. A glass of wine equals 5 oz (150 mL). Reading food labels  Check the serving size of packaged foods. For foods such as rice and pasta, the serving size refers to the amount of cooked product, not dry. Check the total fat in packaged foods. Avoid foods that have saturated fat or trans fats. Check the ingredients list for added sugars, such as corn syrup. Shopping  At the grocery store, buy most of your food from the areas near the walls of the store. This includes: Fresh fruits and vegetables (produce). Grains, beans, nuts, and seeds. Some of these may be available in unpackaged forms or large amounts (in  bulk). Fresh seafood. Poultry and eggs. Low-fat dairy products. Buy whole ingredients instead of prepackaged foods. Buy fresh fruits and vegetables in-season from local farmers markets. Buy frozen fruits and vegetables in resealable bags. If you do not have access to quality fresh seafood, buy precooked frozen shrimp or  canned fish, such as tuna, salmon, or sardines. Buy small amounts of raw or cooked vegetables, salads, or olives from the deli or salad bar at your store. Stock your pantry so you always have certain foods on hand, such as olive oil, canned tuna, canned tomatoes, rice, pasta, and beans. Cooking  Cook foods with extra-virgin olive oil instead of using butter or other vegetable oils. Have meat as a side dish, and have vegetables or grains as your main dish. This means having meat in small portions or adding small amounts of meat to foods like pasta or stew. Use beans or vegetables instead of meat in common dishes like chili or lasagna. Experiment with different cooking methods. Try roasting or broiling vegetables instead of steaming or sauteing them. Add frozen vegetables to soups, stews, pasta, or rice. Add nuts or seeds for added healthy fat at each meal. You can add these to yogurt, salads, or vegetable dishes. Marinate fish or vegetables using olive oil, lemon juice, garlic, and fresh herbs. Meal planning  Plan to eat 1 vegetarian meal one day each week. Try to work up to 2 vegetarian meals, if possible. Eat seafood 2 or more times a week. Have healthy snacks readily available, such as: Vegetable sticks with hummus. Greek yogurt. Fruit and nut trail mix. Eat balanced meals throughout the week. This includes: Fruit: 2-3 servings a day Vegetables: 4-5 servings a day Low-fat dairy: 2 servings a day Fish, poultry, or lean meat: 1 serving a day Beans and legumes: 2 or more servings a week Nuts and seeds: 1-2 servings a day Whole grains: 6-8 servings a day Extra-virgin olive oil: 3-4 servings a day Limit red meat and sweets to only a few servings a month What are my food choices? Mediterranean diet Recommended Grains: Whole-grain pasta. Brown rice. Bulgar wheat. Polenta. Couscous. Whole-wheat bread. Orpah Cobb. Vegetables: Artichokes. Beets. Broccoli. Cabbage. Carrots. Eggplant.  Green beans. Chard. Kale. Spinach. Onions. Leeks. Peas. Squash. Tomatoes. Peppers. Radishes. Fruits: Apples. Apricots. Avocado. Berries. Bananas. Cherries. Dates. Figs. Grapes. Lemons. Melon. Oranges. Peaches. Plums. Pomegranate. Meats and other protein foods: Beans. Almonds. Sunflower seeds. Pine nuts. Peanuts. Cod. Salmon. Scallops. Shrimp. Tuna. Tilapia. Clams. Oysters. Eggs. Dairy: Low-fat milk. Cheese. Greek yogurt. Beverages: Water. Red wine. Herbal tea. Fats and oils: Extra virgin olive oil. Avocado oil. Grape seed oil. Sweets and desserts: [place] yogurt with honey. Baked apples. Poached pears. Trail mix. Seasoning and other foods: Basil. Cilantro. Coriander. Cumin. Mint. Parsley. Sage. Rosemary. Tarragon. Garlic. Oregano. Thyme. Pepper. Balsalmic vinegar. Tahini. Hummus. Tomato sauce. Olives. Mushrooms. Limit these Grains: Prepackaged pasta or rice dishes. Prepackaged cereal with added sugar. Vegetables: Deep fried potatoes (french fries). Fruits: Fruit canned in syrup. Meats and other protein foods: Beef. Pork. Lamb. Poultry with skin. Hot dogs. Tomasa Blase. Dairy: Ice cream. Sour cream. Whole milk. Beverages: Juice. Sugar-sweetened soft drinks. Beer. Liquor and spirits. Fats and oils: Butter. Canola oil. Vegetable oil. Beef fat (tallow). Lard. Sweets and desserts: Cookies. Cakes. Pies. Candy. Seasoning and other foods: Mayonnaise. Premade sauces and marinades. The items listed may not be a complete list. Talk with your dietitian about what dietary choices are right for you. Summary The Mediterranean diet includes both food and lifestyle choices.  Eat a variety of fresh fruits and vegetables, beans, nuts, seeds, and whole grains. Limit the amount of red meat and sweets that you eat. Talk with your health care provider about whether it is safe for you to drink red wine in moderation. This means 1 glass a day for nonpregnant women and 2 glasses a day for men. A glass of wine equals 5 oz (150  mL). This information is not intended to replace advice given to you by your health care provider. Make sure you discuss any questions you have with your health care provider. Document Released: 11/09/2015 Document Revised: 12/12/2015 Document Reviewed: 11/09/2015 Elsevier Interactive Patient Education  2017 [employer].

## 2022-12-23 NOTE — Progress Notes (Signed)
Assessment/Plan:   Dementia likely due to Alzheimer's Disease Developmental Delay  Eric Stuart is a very pleasant 67 y.o. RH male with a history of hypertension, hyperlipidemia, HOH, CKD,  DM2,  developmental delay due to alcoholic syndrome, sarcoidosis, bradycardia in the past, anemia, history of critical cervical stenosis at C4-C5 and severe stenosis at C3-C4, status post cervical disc to me with interbody fusion on 09/03/2022 after presenting to the ED with neurological symptoms with good results, and a history of dementia likely due to Alzheimer's disease seen today in follow up for memory loss. Patient is currently on donepezil 10 mg daily and memantine 10 mg twice a day tolerating well. MMSE today is 12/28. Memory is stable. Mood is good.     Follow up in  6 months. Continue B12 supplementation Continue donepezil 10 mg daily and memantine 10 mg twice daily.  Side effects discussed  Recommend good control of her cardiovascular risk factors Continue to control mood as per PCP Increase activity level.     Subjective:    This patient is accompanied in the office by his sister who supplements the history.  Previous records as well as any outside records available were reviewed prior to todays visit. Patient was last seen on 06/17/2022 with a MMSE of 11/28 (he does not read or write)    Any changes in memory since last visit? "Memory is stable, at times may be a little shorter". Patient has some difficulty remembering recent conversations and people names as before.  repeats oneself? He talks to himself, sister says  Disoriented when walking into a room?  Patient denies    Leaving objects in unusual places?  May misplace things, an does not place it in the correct cabinet.  Wandering behavior?  Denies. Has a doorbell camera just in case   Any personality changes since last visit?  denies   Any worsening depression?:  Denies.   Hallucinations or paranoia?  Denies.   Seizures? denies     Any sleep changes? Sleeps well.  Denies vivid dreams, REM behavior or sleepwalking   Sleep apnea?   Denies.   Any hygiene concerns? Denies.  Independent of bathing and dressing?  Endorsed  Does the patient needs help with medications? Sister is in charge   Who is in charge of the finances? Sister  is in charge     Any changes in appetite?  Denies. He eats well.      Patient have trouble swallowing? Denies.   Does the patient cook? No Any headaches?   denies   Chronic back pain  denies   Ambulates with difficulty? Denies." He is no longer staggering after the surgery".     Recent falls or head injuries? denies     Unilateral weakness, numbness or tingling? denies   Any tremors?  "Not always, as before, no changes" Any anosmia?  Denies   Any incontinence of urine?  Denies  Any bowel dysfunction?   Denies      Patient lives with his sister. Does the patient drive?Never drove   Initial visit 06/17/2022 How long did patient have memory difficulties?  Patient already carries a diagnosis of dementia likely due to Alzheimer disease per other neurologist.  Over the last year, they memory remains about the same according to his sister.  He may have   some difficulty remembering recent conversations and people names, long term memory is good. "He says he does memory games and word search but, I don't know" repeats  oneself?  Denies . "He does not talk much , he never does " Disoriented when walking into a room?  Patient denies  Leaving objects in unusual places? Leaves stuff on the table and does not replace it   Wandering behavior? denies  She has a doorbell camera just in case Any personality changes since last visit? denies   Any history of depression?:  denies   Hallucinations or paranoia?  denies  "but he has a habit of talking to himself "-always did, sister says  Seizures? denies    Any sleep changes?  Endorsed  Denies vivid dreams, REM behavior or sleepwalking   Sleep apnea? denies    Any hygiene concerns?  denies   Independent of bathing and dressing?   denies  Does the patient need help with medications?  Sister is in charge   Who is in charge of the finances? Sister  is in charge     Any changes in appetite?  He eats well     Patient have trouble swallowing?  denies   Does the patient cook?  No   Any headaches?  denies   Chronic back pain?  denies   Ambulates with difficulty?  staggers a little Recent falls or head injuries? denies     Vision changes? Denies Unilateral weakness, numbness or tingling?  denies   Any tremors? "Not always. Any anosmia?  denies   Any incontinence of urine? denies   Any bowel dysfunction?    denies      Patient lives  with sister for the last 25 years   History of heavy alcohol intake? denies   History of heavy tobacco use? denies   Family history of dementia? " Not that I know of"-sister says Dose patient drive? Never drove     MRI of the brain, personally reviewed, 01/27/2020 was remarkable for mild cerebral atrophy and chronic small vessel ischemic changes without acute findings   MRI of the C-spine 08/12/2022 shows severe spinal canal stenosis of C4 and C5 with severe impingement of the spinal cord, likely myelomalacia  PREVIOUS MEDICATIONS:   CURRENT MEDICATIONS:  Outpatient Encounter Medications as of 12/23/2022  Medication Sig   amLODipine (NORVASC) 10 MG tablet Take 1 tablet (10 mg total) by mouth daily.   cyanocobalamin (VITAMIN B12) 1000 MCG tablet Take 1,000 mcg by mouth daily.   cyclobenzaprine (FLEXERIL) 10 MG tablet Take 1 tablet (10 mg total) by mouth 3 (three) times daily as needed for muscle spasms.   donepezil (ARICEPT) 10 MG tablet Take 1 tablet (10 mg total) by mouth at bedtime.   HYDROcodone-acetaminophen (NORCO/VICODIN) 5-325 MG tablet Take 1 tablet by mouth every 4 (four) hours as needed for moderate pain ((score 4 to 6)).   memantine (NAMENDA) 10 MG tablet Take 10 mg by mouth 2 (two) times daily.    metFORMIN (GLUCOPHAGE) 1000 MG tablet Take 0.5 tablets (500 mg total) by mouth 2 (two) times daily with a meal.   olmesartan-hydrochlorothiazide (BENICAR HCT) 40-25 MG tablet Take 1 tablet by mouth daily.   rosuvastatin (CRESTOR) 10 MG tablet Take 1 tablet (10 mg total) by mouth every evening.   No facility-administered encounter medications on file as of 12/23/2022.       12/23/2022    2:00 PM 06/17/2022    2:00 PM 11/04/2019   12:00 PM  MMSE - Mini Mental State Exam  Not completed:  Unable to complete   Orientation to time 0 1 2  Orientation to Place 0  1 2  Registration 3 2 3   Attention/ Calculation 0 0 0  Recall 3 2 3   Language- name 2 objects 2 2 2   Language- repeat 1 1 0  Language- follow 3 step command 2 2 3   Language- read & follow direction 1 0 0  Write a sentence 0 0 0  Copy design 0 0 0  Total score 12 11 15        No data to display          Objective:     PHYSICAL EXAMINATION:    VITALS:   Vitals:   12/23/22 1426  BP: (!) 140/84  Pulse: 77  Resp: 18  SpO2: 97%  Weight: 126 lb (57.2 kg)  Height: 5\' 5"  (1.651 m)    GEN:  The patient appears stated age and is in NAD. HEENT:  Normocephalic, atraumatic.   Neurological examination:  General: NAD, well-groomed, appears stated age. Orientation: The patient is alert. Oriented to person, no to place and date Cranial nerves: There is good facial symmetry.The speech is fluent and clear. No aphasia or dysarthria. Fund of knowledge is reduced. Recent and remote memory are impaired. Attention and concentration are reduced.  Able to name objects and repeat phrases.  Hearing is intact to conversational tone. Delayed recall 3/3 Sensation: Sensation is intact to light touch throughout Motor: Strength is at least antigravity x4. DTR's 2/4 in UE/LE     Movement examination: Tone: There is normal tone in the UE/LE Abnormal movements:  no tremor.  No myoclonus.  No asterixis.   Coordination:  There is no decremation  with RAM's. Normal finger to nose  Gait and Station: The patient has no difficulty arising out of a deep-seated chair without the use of the hands. The patient's stride length is good.  Gait is cautious and narrow.    Thank you for allowing [place] the opportunity to participate in the care of this nice patient. Please do not hesitate to contact us for any questions or concerns.   Total time spent on today's visit was 20 minutes dedicated to this patient today, preparing to see patient, examining the patient, ordering tests and/or medications and counseling the patient, documenting clinical information in the EHR or other health record, independently interpreting results and communicating results to the patient/family, discussing treatment and goals, answering patient's questions and coordinating care.  Cc:  Junie Spencer, FNP  Marlowe Kays 12/23/2022 3:00 PM

## 2023-01-06 ENCOUNTER — Ambulatory Visit: Payer: Medicare HMO | Admitting: Family

## 2023-01-06 ENCOUNTER — Encounter: Payer: Self-pay | Admitting: Family

## 2023-01-06 VITALS — BP 137/86 | HR 61 | Temp 97.9°F | Wt 120.8 lb

## 2023-01-06 DIAGNOSIS — Z7984 Long term (current) use of oral hypoglycemic drugs: Secondary | ICD-10-CM

## 2023-01-06 DIAGNOSIS — Z23 Encounter for immunization: Secondary | ICD-10-CM | POA: Diagnosis not present

## 2023-01-06 DIAGNOSIS — G309 Alzheimer's disease, unspecified: Secondary | ICD-10-CM | POA: Diagnosis not present

## 2023-01-06 DIAGNOSIS — E785 Hyperlipidemia, unspecified: Secondary | ICD-10-CM

## 2023-01-06 DIAGNOSIS — F0280 Dementia in other diseases classified elsewhere without behavioral disturbance: Secondary | ICD-10-CM | POA: Diagnosis not present

## 2023-01-06 DIAGNOSIS — E1159 Type 2 diabetes mellitus with other circulatory complications: Secondary | ICD-10-CM

## 2023-01-06 DIAGNOSIS — R413 Other amnesia: Secondary | ICD-10-CM

## 2023-01-06 DIAGNOSIS — E1169 Type 2 diabetes mellitus with other specified complication: Secondary | ICD-10-CM

## 2023-01-06 DIAGNOSIS — I152 Hypertension secondary to endocrine disorders: Secondary | ICD-10-CM

## 2023-01-06 LAB — CMP14+EGFR
ALT: 12 [IU]/L (ref 0–44)
AST: 17 [IU]/L (ref 0–40)
Albumin: 4.5 g/dL (ref 3.9–4.9)
Alkaline Phosphatase: 69 [IU]/L (ref 44–121)
BUN/Creatinine Ratio: 17 (ref 10–24)
BUN: 28 mg/dL — ABNORMAL HIGH (ref 8–27)
Bilirubin Total: 0.4 mg/dL (ref 0.0–1.2)
CO2: 22 mmol/L (ref 20–29)
Calcium: 9.9 mg/dL (ref 8.6–10.2)
Chloride: 105 mmol/L (ref 96–106)
Creatinine, Ser: 1.65 mg/dL — ABNORMAL HIGH (ref 0.76–1.27)
Globulin, Total: 2.7 g/dL (ref 1.5–4.5)
Glucose: 117 mg/dL — ABNORMAL HIGH (ref 70–99)
Potassium: 4.1 mmol/L (ref 3.5–5.2)
Sodium: 142 mmol/L (ref 134–144)
Total Protein: 7.2 g/dL (ref 6.0–8.5)
eGFR: 46 mL/min/{1.73_m2} — ABNORMAL LOW (ref >59)

## 2023-01-06 LAB — BAYER DCA HB A1C WAIVED: HB A1C (BAYER DCA - WAIVED): 6.4 % — ABNORMAL HIGH (ref 4.8–5.6)

## 2023-01-06 MED ORDER — MEMANTINE HCL 10 MG PO TABS: 10.0000 mg | ORAL_TABLET | Freq: Two times a day (BID) | ORAL | 1 refills | Status: DC

## 2023-01-06 MED ORDER — OLMESARTAN MEDOXOMIL-HCTZ 40-25 MG PO TABS
1.0000 | ORAL_TABLET | Freq: Every day | ORAL | 0 refills | Status: DC
Start: 2023-01-06 — End: 2023-04-18

## 2023-01-06 MED ORDER — ROSUVASTATIN CALCIUM 10 MG PO TABS
10.0000 mg | ORAL_TABLET | Freq: Every evening | ORAL | 5 refills | Status: DC
Start: 2023-01-06 — End: 2023-07-08

## 2023-01-06 MED ORDER — METFORMIN HCL 1000 MG PO TABS
500.0000 mg | ORAL_TABLET | Freq: Two times a day (BID) | ORAL | 3 refills | Status: DC
Start: 2023-01-06 — End: 2024-01-14

## 2023-01-06 MED ORDER — AMLODIPINE BESYLATE 10 MG PO TABS
10.0000 mg | ORAL_TABLET | Freq: Every day | ORAL | 5 refills | Status: DC
Start: 2023-01-06 — End: 2023-07-08

## 2023-01-06 MED ORDER — DONEPEZIL HCL 10 MG PO TABS: 10.0000 mg | ORAL_TABLET | Freq: Every day | ORAL | 2 refills | Status: DC

## 2023-01-06 MED ORDER — CYCLOBENZAPRINE HCL 10 MG PO TABS: 10.0000 mg | ORAL_TABLET | Freq: Three times a day (TID) | ORAL | 0 refills | Status: AC | PRN

## 2023-01-06 NOTE — Patient Instructions (Signed)
Health Maintenance, Male Adopting a healthy lifestyle and getting preventive care are important in promoting health and wellness. Ask your health care provider about: The right schedule for you to have regular tests and exams. Things you can do on your own to prevent diseases and keep yourself healthy. What should I know about diet, weight, and exercise? Eat a healthy diet  Eat a diet that includes plenty of vegetables, fruits, low-fat dairy products, and lean protein. Do not eat a lot of foods that are high in solid fats, added sugars, or sodium. Maintain a healthy weight Body mass index (BMI) is a measurement that can be used to identify possible weight problems. It estimates body fat based on height and weight. Your health care provider can help determine your BMI and help you achieve or maintain a healthy weight. Get regular exercise Get regular exercise. This is one of the most important things you can do for your health. Most adults should: Exercise for at least 150 minutes each week. The exercise should increase your heart rate and make you sweat (moderate-intensity exercise). Do strengthening exercises at least twice a week. This is in addition to the moderate-intensity exercise. Spend less time sitting. Even light physical activity can be beneficial. Watch cholesterol and blood lipids Have your blood tested for lipids and cholesterol at 66 years of age, then have this test every 5 years. You may need to have your cholesterol levels checked more often if: Your lipid or cholesterol levels are high. You are older than 66 years of age. You are at high risk for heart disease. What should I know about cancer screening? Many types of cancers can be detected early and may often be prevented. Depending on your health history and family history, you may need to have cancer screening at various ages. This may include screening for: Colorectal cancer. Prostate cancer. Skin cancer. Lung  cancer. What should I know about heart disease, diabetes, and high blood pressure? Blood pressure and heart disease High blood pressure causes heart disease and increases the risk of stroke. This is more likely to develop in people who have high blood pressure readings or are overweight. Talk with your health care provider about your target blood pressure readings. Have your blood pressure checked: Every 3-5 years if you are 18-39 years of age. Every year if you are 40 years old or older. If you are between the ages of 65 and 75 and are a current or former smoker, ask your health care provider if you should have a one-time screening for abdominal aortic aneurysm (AAA). Diabetes Have regular diabetes screenings. This checks your fasting blood sugar level. Have the screening done: Once every three years after age 45 if you are at a normal weight and have a low risk for diabetes. More often and at a younger age if you are overweight or have a high risk for diabetes. What should I know about preventing infection? Hepatitis B If you have a higher risk for hepatitis B, you should be screened for this virus. Talk with your health care provider to find out if you are at risk for hepatitis B infection. Hepatitis C Blood testing is recommended for: Everyone born from 1945 through 1965. Anyone with known risk factors for hepatitis C. Sexually transmitted infections (STIs) You should be screened each year for STIs, including gonorrhea and chlamydia, if: You are sexually active and are younger than 66 years of age. You are older than 66 years of age and your   health care provider tells you that you are at risk for this type of infection. Your sexual activity has changed since you were last screened, and you are at increased risk for chlamydia or gonorrhea. Ask your health care provider if you are at risk. Ask your health care provider about whether you are at high risk for HIV. Your health care provider  may recommend a prescription medicine to help prevent HIV infection. If you choose to take medicine to prevent HIV, you should first get tested for HIV. You should then be tested every 3 months for as long as you are taking the medicine. Follow these instructions at home: Alcohol use Do not drink alcohol if your health care provider tells you not to drink. If you drink alcohol: Limit how much you have to 0-2 drinks a day. Know how much alcohol is in your drink. In the [place] one drink equals one 12 oz bottle of beer (355 mL), one 5 oz glass of wine (148 mL), or one 1 oz glass of hard liquor (44 mL). Lifestyle Do not use any products that contain nicotine or tobacco. These products include cigarettes, chewing tobacco, and vaping devices, such as e-cigarettes. If you need help quitting, ask your health care provider. Do not use street drugs. Do not share needles. Ask your health care provider for help if you need support or information about quitting drugs. General instructions Schedule regular health, dental, and eye exams. Stay current with your vaccines. Tell your health care provider if: You often feel depressed. You have ever been abused or do not feel safe at home. Summary Adopting a healthy lifestyle and getting preventive care are important in promoting health and wellness. Follow your health care provider's instructions about healthy diet, exercising, and getting tested or screened for diseases. Follow your health care provider's instructions on monitoring your cholesterol and blood pressure. This information is not intended to replace advice given to you by your health care provider. Make sure you discuss any questions you have with your health care provider. Document Revised: 08/07/2020 Document Reviewed: 08/07/2020 Elsevier Patient Education  2024 [employer]  

## 2023-01-06 NOTE — Progress Notes (Signed)
Subjective:    Patient ID: Eric Stuart, male    DOB: 1956-03-09, 67 y.o.   MRN: [id number]  Chief Complaint  Patient presents with   Medical Management of Chronic Issues   Pt presents to the office today for  chronic follow up.    Pt has hx sarcoidosis. He is followed by Neurologists for Alzheimer's Disease every 6 months. He was started on Aricept 10 mg and namenda 10 mg BID. Sister states this has helped slightly. She states some days are better than others.    He had cervical decompression on 09/03/22. Reports he doing well.   Hypertension This is a chronic problem. The current episode started more than 1 year ago. The problem has been resolved since onset. The problem is controlled. Pertinent negatives include no blurred vision, malaise/fatigue, peripheral edema or shortness of breath. Risk factors for coronary artery disease include dyslipidemia, obesity and male gender. The current treatment provides moderate improvement.  Hyperlipidemia This is a chronic problem. The current episode started more than 1 year ago. The problem is controlled. Recent lipid tests were reviewed and are normal. Pertinent negatives include no shortness of breath. Current antihyperlipidemic treatment includes statins. The current treatment provides moderate improvement of lipids. Risk factors for coronary artery disease include dyslipidemia, diabetes mellitus, hypertension and a sedentary lifestyle.  Diabetes He presents for his follow-up diabetic visit. He has type 2 diabetes mellitus. Pertinent negatives for diabetes include no blurred vision and no foot paresthesias. Symptoms are stable. Risk factors for coronary artery disease include diabetes mellitus, hypertension, sedentary lifestyle, dyslipidemia and male sex. He is following a generally healthy diet. (Does not check glucose at home) Eye exam is current (has appointment tomorrow).      Review of Systems  Constitutional:  Negative for  malaise/fatigue.  Eyes:  Negative for blurred vision.  Respiratory:  Negative for shortness of breath.   All other systems reviewed and are negative.      Objective:   Physical Exam Vitals reviewed.  Constitutional:      General: He is not in acute distress.    Appearance: He is well-developed.  HENT:     Head: Normocephalic.     Right Ear: Tympanic membrane normal.     Left Ear: Tympanic membrane normal.  Eyes:     General:        Right eye: No discharge.        Left eye: No discharge.     Pupils: Pupils are equal, round, and reactive to light.  Neck:     Thyroid: No thyromegaly.  Cardiovascular:     Rate and Rhythm: Normal rate and regular rhythm.     Heart sounds: Normal heart sounds. No murmur heard. Pulmonary:     Effort: Pulmonary effort is normal. No respiratory distress.     Breath sounds: Normal breath sounds. No wheezing.  Abdominal:     General: Bowel sounds are normal. There is no distension.     Palpations: Abdomen is soft.     Tenderness: There is no abdominal tenderness.  Musculoskeletal:        General: No tenderness. Normal range of motion.     Cervical back: Normal range of motion and neck supple.  Skin:    General: Skin is warm and dry.     Findings: No erythema or rash.  Neurological:     Mental Status: He is alert and oriented to person, place, and time.     Cranial Nerves: No cranial  nerve deficit.     Deep Tendon Reflexes: Reflexes are normal and symmetric.  Psychiatric:        Mood and Affect: Affect is flat.        Behavior: Behavior normal.        Thought Content: Thought content normal.        Judgment: Judgment normal.       BP 137/86   Pulse 61   Temp 97.9 F (36.6 C) (Temporal)   Wt 120 lb 12.8 oz (54.8 kg)   SpO2 95%   BMI 20.10 kg/m      Assessment & Plan:  Eric Stuart comes in today with chief complaint of Medical Management of Chronic Issues   Diagnosis and orders addressed:  1. Hypertension associated with  diabetes (HCC) - amLODipine (NORVASC) 10 MG tablet; Take 1 tablet (10 mg total) by mouth daily.  Dispense: 30 tablet; Refill: 5 - olmesartan-hydrochlorothiazide (BENICAR HCT) 40-25 MG tablet; Take 1 tablet by mouth daily.  Dispense: 100 tablet; Refill: 0 - CMP14+EGFR  2. Alzheimer's disease (HCC) - donepezil (ARICEPT) 10 MG tablet; Take 1 tablet (10 mg total) by mouth at bedtime.  Dispense: 90 tablet; Refill: 2 - CMP14+EGFR  3. Type 2 diabetes mellitus with other specified complication, without long-term current use of insulin (HCC) - metFORMIN (GLUCOPHAGE) 1000 MG tablet; Take 0.5 tablets (500 mg total) by mouth 2 (two) times daily with a meal.  Dispense: 180 tablet; Refill: 3 - Bayer DCA Hb A1c Waived - CMP14+EGFR - Microalbumin / creatinine urine ratio  4. Hyperlipidemia due to type 2 diabetes mellitus (HCC) - rosuvastatin (CRESTOR) 10 MG tablet; Take 1 tablet (10 mg total) by mouth every evening.  Dispense: 30 tablet; Refill: 5 - CMP14+EGFR  5. Encounter for immunization - Flu Vaccine Trivalent High Dose (Fluad) - CMP14+EGFR  6. Memory impairment - CMP14+EGFR   Labs pending Continue current medications  Health Maintenance reviewed Diet and exercise encouraged  Follow up plan: 6 months    Jannifer Rodney, FNP

## 2023-01-07 DIAGNOSIS — H2513 Age-related nuclear cataract, bilateral: Secondary | ICD-10-CM | POA: Diagnosis not present

## 2023-01-07 DIAGNOSIS — E119 Type 2 diabetes mellitus without complications: Secondary | ICD-10-CM | POA: Diagnosis not present

## 2023-01-07 DIAGNOSIS — H40033 Anatomical narrow angle, bilateral: Secondary | ICD-10-CM | POA: Diagnosis not present

## 2023-01-07 DIAGNOSIS — H35372 Puckering of macula, left eye: Secondary | ICD-10-CM | POA: Diagnosis not present

## 2023-01-07 LAB — MICROALBUMIN / CREATININE URINE RATIO
Creatinine, Urine: 112.9 mg/dL
Microalb/Creat Ratio: 33 mg/g{creat} — ABNORMAL HIGH (ref 0–29)
Microalbumin, Urine: 37.2 ug/mL

## 2023-01-07 LAB — HM DIABETES EYE EXAM

## 2023-04-04 ENCOUNTER — Ambulatory Visit (INDEPENDENT_AMBULATORY_CARE_PROVIDER_SITE_OTHER): Payer: Medicare HMO

## 2023-04-04 VITALS — BP 130/76 | Ht 65.0 in | Wt 125.0 lb

## 2023-04-04 DIAGNOSIS — Z0000 Encounter for general adult medical examination without abnormal findings: Secondary | ICD-10-CM | POA: Diagnosis not present

## 2023-04-04 NOTE — Patient Instructions (Signed)
 Eric Stuart , Thank you for taking time to come for your Medicare Wellness Visit. I appreciate your ongoing commitment to your health goals. Please review the following plan we discussed and let me know if I can assist you in the future.   Referrals/Orders/Follow-Ups/Clinician Recommendations: Aim for 30 minutes of exercise or brisk walking, 6-8 glasses of water, and 5 servings of fruits and vegetables each day.  This is a list of the screening recommended for you and due dates:  Health Maintenance  Topic Date Due   COVID-19 Vaccine (4 - 2024-25 season) 12/01/2022   DTaP/Tdap/Td vaccine (4 - Td or Tdap) 03/02/2023   Medicare Annual Wellness Visit  03/21/2023   Zoster (Shingles) Vaccine (2 of 2) 04/08/2023*   Pneumonia Vaccine (3 of 3 - PPSV23 or PCV20) 01/06/2024*   Hemoglobin A1C  07/07/2023   Complete foot exam   09/09/2023   Yearly kidney function blood test for diabetes  01/06/2024   Yearly kidney health urinalysis for diabetes  01/06/2024   Eye exam for diabetics  01/07/2024   Colon Cancer Screening  09/14/2027   Flu Shot  Completed   Hepatitis C Screening  Completed   HPV Vaccine  Aged Out  *Topic was postponed. The date shown is not the original due date.    Advanced directives: (ACP Link)Information on Advanced Care Planning can be found at [place]  Secretary of [facility] Advance Health Care Directives Advance Health Care Directives ([email or web address]   Next Medicare Annual Wellness Visit scheduled for next year: Yes

## 2023-04-04 NOTE — Progress Notes (Signed)
 Subjective:   Eric Stuart is a 68 y.o. male who presents for Medicare Annual/Subsequent preventive examination.  Visit Complete: In person  Cardiac Risk Factors include: advanced age (>67men, >18 women);dyslipidemia;diabetes mellitus;hypertension;male gender     Objective:    Today's Vitals   04/04/23 1420  BP: 130/76  Weight: 125 lb (56.7 kg)  Height: 5' 5 (1.651 m)   Body mass index is 20.8 kg/m.     04/04/2023    2:46 PM 12/23/2022    2:29 PM 09/03/2022    9:08 AM 08/29/2022    2:50 PM 06/17/2022    1:18 PM 03/20/2022    9:21 AM 03/19/2021    8:29 AM  Advanced Directives  Does Patient Have a Medical Advance Directive? No No No No Yes Yes No  Type of Advance Directive     Living will Living will;Healthcare Power of Attorney   Does patient want to make changes to medical advance directive?      No - Patient declined   Copy of Healthcare Power of Attorney in Chart?      Yes - validated most recent copy scanned in chart (See row information)   Would patient like information on creating a medical advance directive? Yes (MAU/Ambulatory/Procedural Areas - Information given) No - Patient declined No - Patient declined No - Patient declined   No - Guardian declined    Current Medications (verified) Outpatient Encounter Medications as of 04/04/2023  Medication Sig   amLODipine  (NORVASC ) 10 MG tablet Take 1 tablet (10 mg total) by mouth daily.   cyanocobalamin  (VITAMIN B12) 1000 MCG tablet Take 1,000 mcg by mouth daily.   cyclobenzaprine  (FLEXERIL ) 10 MG tablet Take 1 tablet (10 mg total) by mouth 3 (three) times daily as needed for muscle spasms.   donepezil  (ARICEPT ) 10 MG tablet Take 1 tablet (10 mg total) by mouth at bedtime.   memantine  (NAMENDA ) 10 MG tablet Take 1 tablet (10 mg total) by mouth 2 (two) times daily.   metFORMIN  (GLUCOPHAGE ) 1000 MG tablet Take 0.5 tablets (500 mg total) by mouth 2 (two) times daily with a meal.   olmesartan -hydrochlorothiazide  (BENICAR  HCT)  40-25 MG tablet Take 1 tablet by mouth daily.   rosuvastatin  (CRESTOR ) 10 MG tablet Take 1 tablet (10 mg total) by mouth every evening.   No facility-administered encounter medications on file as of 04/04/2023.    Allergies (verified) Patient has no known allergies.   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   Diabetes mellitus without complication (HCC)    Hyperlipidemia    Hypertension    Sarcoidosis 36   Sister provided    Past Surgical History:  Procedure Laterality Date   ADENOIDECTOMY  childhod   ANTERIOR CERVICAL DECOMP/DISCECTOMY FUSION N/A 09/03/2022   Procedure: Anterior Cervical Decompression Fusion  Cervical three-four, Cervical four-five;  Surgeon: Louis Shove, MD;  Location: [facility] OR;  Service: Neurosurgery;  Laterality: N/A;   COLONOSCOPY  2012   TONSILLECTOMY     Family History  Problem Relation Age of Onset   Cancer Mother        cervical   Peripheral Artery Disease Father    Heart failure Father    Diabetes Sister    Heart attack Brother    Heart attack Sister    Stroke Sister    Colon cancer Neg Hx    Colon polyps Neg Hx    Esophageal cancer  Neg Hx    Rectal cancer Neg Hx    Stomach cancer Neg Hx    Social History   Socioeconomic History   Marital status: Single    Spouse name: Not on file   Number of children: 0   Years of education: 93   Highest education level: 12th grade  Occupational History   Occupation: disabled  Tobacco Use   Smoking status: Former    Current packs/day: 0.00    Average packs/day: 2.0 packs/day for 4.0 years (8.0 ttl pk-yrs)    Types: Cigarettes    Start date: 09/29/2009    Quit date: 09/29/2013    Years since quitting: 9.5   Smokeless tobacco: Never  Vaping Use   Vaping status: Never Used  Substance and Sexual Activity   Alcohol use: No    Alcohol/week: 0.0 standard drinks of alcohol   Drug use: No   Sexual activity: Not Currently   Other Topics Concern   Not on file  Social History Narrative   Patient is disabled and lives at home with his sister. He is very active and walks or bicycles around town. He does this daily and probably averages about 50 miles a week on his bike. He is not married and does not have any children.    Are you right handed or left handed? left   Are you currently employed ? 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    What type of home do you live in: 1 story or 2 story? two   Caffeine 2 cups day     Social Drivers of Health   Financial Resource Strain: Low Risk  (04/04/2023)   Overall Financial Resource Strain (CARDIA)    Difficulty of Paying Living Expenses: Not hard at all  Food Insecurity: No Food Insecurity (04/04/2023)   Hunger Vital Sign    Worried About Running Out of Food in the Last Year: Never true    Ran Out of Food in the Last Year: Never true  Transportation Needs: No Transportation Needs (04/04/2023)   PRAPARE - Administrator, Civil Service (Medical): No    Lack of Transportation (Non-Medical): No  Physical Activity: Insufficiently Active (04/04/2023)   Exercise Vital Sign    Days of Exercise per Week: 3 days    Minutes of Exercise per Session: 30 min  Stress: No Stress Concern Present (04/04/2023)   [employer] of Occupational Health - Occupational Stress Questionnaire    Feeling of Stress : Not at all  Social Connections: Moderately Isolated (04/04/2023)   Social Connection and Isolation Panel [NHANES]    Frequency of Communication with Friends and Family: More than three times a week    Frequency of Social Gatherings with Friends and Family: Three times a week    Attends Religious Services: More than 4 times per year    Active Member of Clubs or Organizations: No    Attends Banker Meetings: Never    Marital Status: Never married    Tobacco Counseling Counseling given: Not  Answered   Clinical Intake:  Pre-visit preparation completed: Yes  Pain : No/denies pain     Diabetes: Yes CBG done?: No Did pt. bring in CBG monitor from home?: No  How often do you need to have someone help you when you read instructions, pamphlets, or other written materials from your doctor or pharmacy?: 1 - Never  Interpreter Needed?: No  Comments: Assisted with visit by sister- [place] Information entered by :: Charmaine Bloodgood LPN   Activities of Daily Living    04/04/2023    2:44 PM 08/29/2022    2:52 PM  In your present state of health, do you have any difficulty performing the following activities:  Hearing? 0   Vision? 0   Difficulty concentrating or making decisions? 1   Walking or climbing stairs? 0   Dressing or bathing? 0   Doing errands, shopping? 1 1  Comment  Pt unable to drive  Preparing Food and eating ? N   Using the Toilet? N   In the past six months, have you accidently leaked urine? N   Do you have problems with loss of bowel control? N   Managing your Medications? Y   Managing your Finances? Y   Housekeeping or managing your Housekeeping? Y     Patient Care Team: Lavell Bari LABOR, FNP as PCP - General (Nurse Practitioner) Nivia Sally Alice, OD (Optometry) Dina Sara E, PA-C (Neurology) Louis Shove, MD as Consulting Physician (Neurosurgery)  Indicate any recent Medical Services you may have received from other than [facility] providers in the past year (date may be approximate).     Assessment:   This is a routine wellness examination for Tycho.  Hearing/Vision screen Hearing Screening - Comments:: Denies hearing difficulties   Vision Screening - Comments:: Wears rx glasses - up to date with routine eye exams with [facility]     Goals Addressed             This Visit's Progress    COMPLETED: Patient Stated       03/16/2020 AWV Goal: Keep All Scheduled Appointments  Over the next year, patient will attend all scheduled  appointments with their PCP and any specialists that they see.       Depression Screen    04/04/2023    2:45 PM 09/09/2022   11:51 AM 03/20/2022    9:19 AM 01/07/2022    8:41 AM 03/19/2021    8:31 AM 02/16/2021    4:35 PM 12/25/2020    2:15 PM  PHQ 2/9 Scores  PHQ - 2 Score 0 0 0 0 1 2 0  PHQ- 9 Score  0   6 9 5     Fall Risk    04/04/2023    2:22 PM 12/23/2022    2:31 PM 09/09/2022   11:51 AM 06/17/2022    1:18 PM 03/20/2022    8:58 AM  Fall Risk   Falls in the past year? 0 0 0 0 0  Number falls in past yr: 0 0 0 0 0  Injury with Fall? 0 0 0 0 0  Risk for fall due to : No Fall Risks    Impaired balance/gait;Mental status change  Follow up Falls prevention discussed;Education provided;Falls evaluation completed Falls evaluation completed  Falls evaluation completed Falls evaluation completed;Education provided;Falls prevention discussed    MEDICARE RISK AT HOME: Medicare Risk at Home Any stairs in or around the home?: No If so, are there any without handrails?: No Home free of loose throw rugs in walkways, pet beds, electrical cords, etc?: Yes Adequate lighting in your home to reduce risk of falls?: Yes Life alert?: No Use of a cane, walker or w/c?: No Grab bars in the bathroom?: Yes Shower chair or bench in shower?: No Elevated toilet seat or a handicapped toilet?: Yes  TIMED UP AND GO:  Was the  test performed?  No    Cognitive Function:    12/23/2022    2:00 PM 06/17/2022    2:00 PM 11/04/2019   12:00 PM 02/13/2018    3:31 PM 01/30/2017    3:50 PM  MMSE - Mini Mental State Exam  Not completed:  Unable to complete  Unable to complete Unable to complete  Orientation to time 0 1 2    Orientation to Place 0 1 2    Registration 3 2 3     Attention/ Calculation 0 0 0    Recall 3 2 3     Language- name 2 objects 2 2 2     Language- repeat 1 1 0    Language- follow 3 step command 2 2 3     Language- read & follow direction 1 0 0    Write a sentence 0 0 0    Copy design 0 0  0    Total score 12 11 15           04/04/2023    2:47 PM 03/20/2022    9:22 AM  6CIT Screen  What Year? 0 points 0 points  What month? 0 points 0 points  What time? 0 points 0 points  Count back from 20 2 points 4 points  Months in reverse 2 points 4 points  Repeat phrase 6 points 4 points  Total Score 10 points 12 points    Immunizations Immunization History  Administered Date(s) Administered   Fluad Quad(high Dose 65+) 01/07/2022   Fluad Trivalent(High Dose 65+) 01/06/2023   Influenza Inj Mdck Quad Pf 12/15/2017   Influenza Split 02/26/2016, 01/02/2017   Influenza,inj,Quad PF,6+ Mos 03/01/2013, 12/29/2013, 04/05/2019, 06/01/2020, 12/25/2020   Influenza-Unspecified 12/15/2017   Moderna Sars-Covid-2 Vaccination 06/17/2019, 07/16/2019, 02/19/2020   Pneumococcal Conjugate-13 04/26/2014   Pneumococcal Polysaccharide-23 02/13/2018   Td 03/01/2013   Td (Adult), 2 Lf Tetanus Toxid, Preservative Free 03/01/2013   Tdap 03/01/2013   Zoster Recombinant(Shingrix ) 07/02/2021   Zoster, Live 05/04/2012    TDAP status: Due, Education has been provided regarding the importance of this vaccine. Advised may receive this vaccine at local pharmacy or Health Dept. Aware to provide a copy of the vaccination record if obtained from local pharmacy or Health Dept. Verbalized acceptance and understanding.  Flu Vaccine status: Up to date  Pneumococcal vaccine status: Up to date  Covid-19 vaccine status: Information provided on how to obtain vaccines.   Qualifies for Shingles Vaccine? Yes   Zostavax completed Yes   Shingrix  Completed?: No.    Education has been provided regarding the importance of this vaccine. Patient has been advised to call insurance company to determine out of pocket expense if they have not yet received this vaccine. Advised may also receive vaccine at local pharmacy or Health Dept. Verbalized acceptance and understanding.  Screening Tests Health Maintenance  Topic Date Due    COVID-19 Vaccine (4 - 2024-25 season) 12/01/2022   DTaP/Tdap/Td (4 - Td or Tdap) 03/02/2023   Zoster Vaccines- Shingrix  (2 of 2) 04/08/2023 (Originally 08/27/2021)   Pneumonia Vaccine 79+ Years old (3 of 3 - PPSV23 or PCV20) 01/06/2024 (Originally 02/14/2023)   HEMOGLOBIN A1C  07/07/2023   FOOT EXAM  09/09/2023   Diabetic kidney evaluation - eGFR measurement  01/06/2024   Diabetic kidney evaluation - Urine ACR  01/06/2024   OPHTHALMOLOGY EXAM  01/07/2024   Medicare Annual Wellness (AWV)  04/03/2024   Colonoscopy  09/14/2027   INFLUENZA VACCINE  Completed   Hepatitis C Screening  Completed   HPV VACCINES  Aged Out    Health Maintenance  Health Maintenance Due  Topic Date Due   COVID-19 Vaccine (4 - 2024-25 season) 12/01/2022   DTaP/Tdap/Td (4 - Td or Tdap) 03/02/2023    Colorectal cancer screening: Type of screening: Colonoscopy. Completed 09/13/20. Repeat every 7 years  Lung Cancer Screening: (Low Dose CT Chest recommended if Age 57-80 years, 20 pack-year currently smoking OR have quit w/in 15years.) does not qualify.   Lung Cancer Screening Referral: n/a  Additional Screening:  Hepatitis C Screening: does qualify; Completed 12/12/16  Vision Screening: Recommended annual ophthalmology exams for early detection of glaucoma and other disorders of the eye. Is the patient up to date with their annual eye exam?  Yes  Who is the provider or what is the name of the office in which the patient attends annual eye exams? [facility] If pt is not established with a provider, would they like to be referred to a provider to establish care? No .   Dental Screening: Recommended annual dental exams for proper oral hygiene  Diabetic Foot Exam: Diabetic Foot Exam: Completed 09/09/22  Community Resource Referral / Chronic Care Management: CRR required this visit?  No   CCM required this visit?  No     Plan:     I have personally reviewed and noted the following in the patient's  chart:   Medical and social history Use of alcohol, tobacco or illicit drugs  Current medications and supplements including opioid prescriptions. Patient is not currently taking opioid prescriptions. Functional ability and status Nutritional status Physical activity Advanced directives List of other physicians Hospitalizations, surgeries, and ER visits in previous 12 months Vitals Screenings to include cognitive, depression, and falls Referrals and appointments  In addition, I have reviewed and discussed with patient certain preventive protocols, quality metrics, and best practice recommendations. A written personalized care plan for preventive services as well as general preventive health recommendations were provided to patient.     Lavelle Pfeiffer [place], [place]   11/04/7972   After Visit Summary: (In Person-Printed) AVS printed and given to the patient  Nurse Notes: No concerns at this time

## 2023-04-14 DIAGNOSIS — H52223 Regular astigmatism, bilateral: Secondary | ICD-10-CM | POA: Diagnosis not present

## 2023-04-14 DIAGNOSIS — H5021 Vertical strabismus, right eye: Secondary | ICD-10-CM | POA: Diagnosis not present

## 2023-04-14 DIAGNOSIS — H35372 Puckering of macula, left eye: Secondary | ICD-10-CM | POA: Diagnosis not present

## 2023-04-14 DIAGNOSIS — H5203 Hypermetropia, bilateral: Secondary | ICD-10-CM | POA: Diagnosis not present

## 2023-04-14 DIAGNOSIS — H2513 Age-related nuclear cataract, bilateral: Secondary | ICD-10-CM | POA: Diagnosis not present

## 2023-04-14 DIAGNOSIS — H524 Presbyopia: Secondary | ICD-10-CM | POA: Diagnosis not present

## 2023-04-14 DIAGNOSIS — E119 Type 2 diabetes mellitus without complications: Secondary | ICD-10-CM | POA: Diagnosis not present

## 2023-04-18 ENCOUNTER — Other Ambulatory Visit: Payer: Self-pay | Admitting: Family

## 2023-04-18 DIAGNOSIS — I152 Hypertension secondary to endocrine disorders: Secondary | ICD-10-CM

## 2023-04-21 ENCOUNTER — Encounter: Payer: Self-pay | Admitting: Physician Assistant

## 2023-05-12 ENCOUNTER — Other Ambulatory Visit: Payer: Self-pay | Admitting: Family

## 2023-06-07 DIAGNOSIS — M4800 Spinal stenosis, site unspecified: Secondary | ICD-10-CM | POA: Diagnosis not present

## 2023-06-07 DIAGNOSIS — Z809 Family history of malignant neoplasm, unspecified: Secondary | ICD-10-CM | POA: Diagnosis not present

## 2023-06-07 DIAGNOSIS — I2510 Atherosclerotic heart disease of native coronary artery without angina pectoris: Secondary | ICD-10-CM | POA: Diagnosis not present

## 2023-06-07 DIAGNOSIS — Z7984 Long term (current) use of oral hypoglycemic drugs: Secondary | ICD-10-CM | POA: Diagnosis not present

## 2023-06-07 DIAGNOSIS — Z833 Family history of diabetes mellitus: Secondary | ICD-10-CM | POA: Diagnosis not present

## 2023-06-07 DIAGNOSIS — Z87891 Personal history of nicotine dependence: Secondary | ICD-10-CM | POA: Diagnosis not present

## 2023-06-07 DIAGNOSIS — G309 Alzheimer's disease, unspecified: Secondary | ICD-10-CM | POA: Diagnosis not present

## 2023-06-07 DIAGNOSIS — N1831 Chronic kidney disease, stage 3a: Secondary | ICD-10-CM | POA: Diagnosis not present

## 2023-06-07 DIAGNOSIS — E785 Hyperlipidemia, unspecified: Secondary | ICD-10-CM | POA: Diagnosis not present

## 2023-06-07 DIAGNOSIS — E1122 Type 2 diabetes mellitus with diabetic chronic kidney disease: Secondary | ICD-10-CM | POA: Diagnosis not present

## 2023-06-07 DIAGNOSIS — Z8249 Family history of ischemic heart disease and other diseases of the circulatory system: Secondary | ICD-10-CM | POA: Diagnosis not present

## 2023-06-07 DIAGNOSIS — E1165 Type 2 diabetes mellitus with hyperglycemia: Secondary | ICD-10-CM | POA: Diagnosis not present

## 2023-06-07 DIAGNOSIS — Z008 Encounter for other general examination: Secondary | ICD-10-CM | POA: Diagnosis not present

## 2023-06-23 ENCOUNTER — Other Ambulatory Visit: Payer: Self-pay | Admitting: Family

## 2023-06-23 ENCOUNTER — Ambulatory Visit: Payer: Medicare HMO | Admitting: Physician Assistant

## 2023-06-30 ENCOUNTER — Encounter: Payer: Self-pay | Admitting: Physician Assistant

## 2023-06-30 ENCOUNTER — Ambulatory Visit: Payer: Medicare HMO | Admitting: Physician Assistant

## 2023-07-08 ENCOUNTER — Ambulatory Visit (INDEPENDENT_AMBULATORY_CARE_PROVIDER_SITE_OTHER): Payer: Medicare HMO | Admitting: Family

## 2023-07-08 ENCOUNTER — Ambulatory Visit (INDEPENDENT_AMBULATORY_CARE_PROVIDER_SITE_OTHER)

## 2023-07-08 ENCOUNTER — Encounter: Payer: Self-pay | Admitting: Family

## 2023-07-08 VITALS — BP 134/77 | HR 47 | Temp 97.1°F | Ht 64.0 in | Wt 124.0 lb

## 2023-07-08 DIAGNOSIS — Z23 Encounter for immunization: Secondary | ICD-10-CM

## 2023-07-08 DIAGNOSIS — M5030 Other cervical disc degeneration, unspecified cervical region: Secondary | ICD-10-CM | POA: Diagnosis not present

## 2023-07-08 DIAGNOSIS — E785 Hyperlipidemia, unspecified: Secondary | ICD-10-CM

## 2023-07-08 DIAGNOSIS — I152 Hypertension secondary to endocrine disorders: Secondary | ICD-10-CM

## 2023-07-08 DIAGNOSIS — Z0000 Encounter for general adult medical examination without abnormal findings: Secondary | ICD-10-CM | POA: Diagnosis not present

## 2023-07-08 DIAGNOSIS — F0280 Dementia in other diseases classified elsewhere without behavioral disturbance: Secondary | ICD-10-CM | POA: Diagnosis not present

## 2023-07-08 DIAGNOSIS — G309 Alzheimer's disease, unspecified: Secondary | ICD-10-CM | POA: Diagnosis not present

## 2023-07-08 DIAGNOSIS — Z0001 Encounter for general adult medical examination with abnormal findings: Secondary | ICD-10-CM | POA: Diagnosis not present

## 2023-07-08 DIAGNOSIS — G992 Myelopathy in diseases classified elsewhere: Secondary | ICD-10-CM

## 2023-07-08 DIAGNOSIS — M542 Cervicalgia: Secondary | ICD-10-CM | POA: Diagnosis not present

## 2023-07-08 DIAGNOSIS — E1159 Type 2 diabetes mellitus with other circulatory complications: Secondary | ICD-10-CM

## 2023-07-08 DIAGNOSIS — M4802 Spinal stenosis, cervical region: Secondary | ICD-10-CM | POA: Diagnosis not present

## 2023-07-08 DIAGNOSIS — E1169 Type 2 diabetes mellitus with other specified complication: Secondary | ICD-10-CM | POA: Diagnosis not present

## 2023-07-08 LAB — BAYER DCA HB A1C WAIVED: HB A1C (BAYER DCA - WAIVED): 6.4 % — ABNORMAL HIGH (ref 4.8–5.6)

## 2023-07-08 MED ORDER — ROSUVASTATIN CALCIUM 10 MG PO TABS: 10.0000 mg | ORAL_TABLET | Freq: Every evening | ORAL | 5 refills | Status: DC

## 2023-07-08 MED ORDER — AMLODIPINE BESYLATE 10 MG PO TABS: 10.0000 mg | ORAL_TABLET | Freq: Every day | ORAL | 5 refills | Status: DC

## 2023-07-08 MED ORDER — MEMANTINE HCL 10 MG PO TABS: 10.0000 mg | ORAL_TABLET | Freq: Two times a day (BID) | ORAL | 2 refills | Status: DC

## 2023-07-08 NOTE — Progress Notes (Signed)
 Subjective:    Patient ID: Eric Stuart, male    DOB: May 15, 1955, 68 y.o.   MRN: [id number]  Chief Complaint  Patient presents with   Medical Management of Chronic Issues   Pt presents to the office today for  CPE and chronic follow up.    Pt has hx sarcoidosis. He is followed by Neurologists for Alzheimer's Disease every 6 months. He was started on Aricept 10 mg and namenda 10 mg BID. Sister states this has helped slightly. She states some days are better than others.    He had cervical decompression on 09/03/22. Reports mild pain in the left side of his neck.  Hypertension This is a chronic problem. The current episode started more than 1 year ago. The problem has been resolved since onset. The problem is controlled. Associated symptoms include malaise/fatigue. Pertinent negatives include no blurred vision, peripheral edema or shortness of breath. Risk factors for coronary artery disease include dyslipidemia, obesity and male gender. The current treatment provides moderate improvement.  Hyperlipidemia This is a chronic problem. The current episode started more than 1 year ago. The problem is controlled. Recent lipid tests were reviewed and are normal. Pertinent negatives include no shortness of breath. Current antihyperlipidemic treatment includes statins. The current treatment provides moderate improvement of lipids. Risk factors for coronary artery disease include dyslipidemia, diabetes mellitus, hypertension and a sedentary lifestyle.  Diabetes He presents for his follow-up diabetic visit. He has type 2 diabetes mellitus. Pertinent negatives for diabetes include no blurred vision and no foot paresthesias. Symptoms are stable. Risk factors for coronary artery disease include diabetes mellitus, hypertension, sedentary lifestyle, dyslipidemia and male sex. He is following a generally healthy diet. (Does not check glucose at home) Eye exam is current (has appointment tomorrow).       Review of Systems  Constitutional:  Positive for malaise/fatigue.  Eyes:  Negative for blurred vision.  Respiratory:  Negative for shortness of breath.   All other systems reviewed and are negative.  Family History  Problem Relation Age of Onset   Cancer Mother        cervical   Peripheral Artery Disease Father    Heart failure Father    Diabetes Sister    Heart attack Brother    Heart attack Sister    Stroke Sister    Colon cancer Neg Hx    Colon polyps Neg Hx    Esophageal cancer Neg Hx    Rectal cancer Neg Hx    Stomach cancer Neg Hx    Social History   Socioeconomic History   Marital status: Single    Spouse name: Not on file   Number of children: 0   Years of education: 12   Highest education level: 12th grade  Occupational History   Occupation: disabled  Tobacco Use   Smoking status: Former    Current packs/day: 0.00    Average packs/day: 2.0 packs/day for 4.0 years (8.0 ttl pk-yrs)    Types: Cigarettes    Start date: 09/29/2009    Quit date: 09/29/2013    Years since quitting: 9.7   Smokeless tobacco: Never  Vaping Use   Vaping status: Never Used  Substance and Sexual Activity   Alcohol use: No    Alcohol/week: 0.0 standard drinks of alcohol   Drug use: No   Sexual activity: Not Currently  Other Topics Concern   Not on file  Social History Narrative   Patient is disabled and lives at home with his sister.  He is very active and walks or bicycles around town. He does this daily and probably averages about 50 miles a week on his bike. He is not married and does not have any children.    Are you right handed or left handed? left   Are you currently employed ? 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    What type of home do you live in: 1 story or 2 story? two   Caffeine 2 cups day     Social Drivers of Health   Financial Resource Strain: Low Risk  (04/04/2023)   Overall Financial Resource  Strain (CARDIA)    Difficulty of Paying Living Expenses: Not hard at all  Food Insecurity: No Food Insecurity (04/04/2023)   Hunger Vital Sign    Worried About Running Out of Food in the Last Year: Never true    Ran Out of Food in the Last Year: Never true  Transportation Needs: No Transportation Needs (04/04/2023)   PRAPARE - Administrator, Civil Service (Medical): No    Lack of Transportation (Non-Medical): No  Physical Activity: Insufficiently Active (04/04/2023)   Exercise Vital Sign    Days of Exercise per Week: 3 days    Minutes of Exercise per Session: 30 min  Stress: No Stress Concern Present (04/04/2023)   [employer] of Occupational Health - Occupational Stress Questionnaire    Feeling of Stress : Not at all  Social Connections: Moderately Isolated (04/04/2023)   Social Connection and Isolation Panel [NHANES]    Frequency of Communication with Friends and Family: More than three times a week    Frequency of Social Gatherings with Friends and Family: Three times a week    Attends Religious Services: More than 4 times per year    Active Member of Clubs or Organizations: No    Attends Banker Meetings: Never    Marital Status: Never married       Objective:   Physical Exam Vitals reviewed.  Constitutional:      General: He is not in acute distress.    Appearance: He is well-developed.  HENT:     Head: Normocephalic.     Right Ear: Tympanic membrane normal.     Left Ear: Tympanic membrane normal.  Eyes:     General:        Right eye: No discharge.        Left eye: No discharge.     Pupils: Pupils are equal, round, and reactive to light.  Neck:     Thyroid: No thyromegaly.  Cardiovascular:     Rate and Rhythm: Normal rate and regular rhythm.     Heart sounds: Normal heart sounds. No murmur heard. Pulmonary:     Effort: Pulmonary effort is normal. No respiratory distress.     Breath sounds: Normal breath sounds. No wheezing.  Abdominal:      General: Bowel sounds are normal. There is no distension.     Palpations: Abdomen is soft.     Tenderness: There is no abdominal tenderness.  Musculoskeletal:        General: No tenderness. Normal range of motion.       Arms:     Cervical back: Normal range of motion and neck supple.     Comments: Full ROM, slight pain in left neck   Skin:    General: Skin is warm and  dry.     Findings: No erythema or rash.  Neurological:     Mental Status: He is alert and oriented to person, place, and time.     Cranial Nerves: No cranial nerve deficit.     Deep Tendon Reflexes: Reflexes are normal and symmetric.  Psychiatric:        Mood and Affect: Affect is flat.        Behavior: Behavior normal.        Thought Content: Thought content normal.        Cognition and Memory: Memory is impaired. He exhibits impaired recent memory and impaired remote memory.        Judgment: Judgment normal.       BP 134/77   Pulse (!) 47   Temp (!) 97.1 F (36.2 C)   Ht 5\' 4"  (1.626 m)   Wt 124 lb (56.2 kg)   SpO2 97%   BMI 21.28 kg/m      Assessment & Plan:  Jadarian Mckay comes in today with chief complaint of Medical Management of Chronic Issues   Diagnosis and orders addressed:  1. Hypertension associated with diabetes (HCC) - CBC with Differential/Platelet - CMP14+EGFR - amLODipine (NORVASC) 10 MG tablet; Take 1 tablet (10 mg total) by mouth daily.  Dispense: 30 tablet; Refill: 5  2. Hyperlipidemia due to type 2 diabetes mellitus (HCC) - CBC with Differential/Platelet - Lipid panel - CMP14+EGFR - rosuvastatin (CRESTOR) 10 MG tablet; Take 1 tablet (10 mg total) by mouth every evening.  Dispense: 30 tablet; Refill: 5  3. Annual physical exam (Primary) - Bayer DCA Hb A1c Waived - CBC with Differential/Platelet - Lipid panel - CMP14+EGFR - Vitamin B12 - amLODipine (NORVASC) 10 MG tablet; Take 1 tablet (10 mg total) by mouth daily.  Dispense: 30 tablet; Refill: 5  4. Type 2 diabetes  mellitus with other specified complication, without long-term current use of insulin (HCC)  - Bayer DCA Hb A1c Waived - CBC with Differential/Platelet - Lipid panel - CMP14+EGFR - Vitamin B12  5. Stenosis of cervical spine with myelopathy (HCC) - CBC with Differential/Platelet - CMP14+EGFR - DG Cervical Spine Complete; Future  6. Alzheimer's disease (HCC) - CBC with Differential/Platelet - CMP14+EGFR  7. Neck pain X-ray pending  Tylenol as needed  - DG Cervical Spine Complete; Future   Labs pending Continue current medications  Health Maintenance reviewed Diet and exercise encouraged  Follow up plan: 6 months    Jannifer Rodney, FNP

## 2023-07-08 NOTE — Patient Instructions (Signed)
 Cervical Sprain A cervical sprain is a stretch or tear in one or more of the ligaments in the neck. Ligaments are the tissues that connect bones to each other. Cervical sprains can range from mild to severe. Severe cervical sprains can cause the spinal bones (vertebrae) in the neck to be unstable. This can result in spinal cord damage and serious nervous system problems. Healing time for a cervical sprain depends on the cause and extent of the injury. Most cervical sprains heal in 4-6 weeks. What are the causes? Cervical sprains may be caused by trauma, such as an injury from a motor vehicle accident, a fall, or a sudden forward and backward whipping movement of the head and neck (whiplash injury). Mild cervical sprains may be caused by wear and tear over time. What increases the risk? You are more likely to get a cervical sprain if: You take part in activities that have a high risk of trauma to the neck. These include contact sports, gymnastics, and diving. You have: Osteoarthritis of the spine. Poor strength and flexibility of the neck. Poor posture. You have had a neck injury in the past. You spend long periods in positions that put stress on the neck, such as sitting at a computer. What are the signs or symptoms? Symptoms of this condition include: Any of these problems in the neck, shoulders, or upper back: Pain or tenderness. Stiffness. Swelling. A burning feeling. Sudden tightening of neck muscles (spasms). Limited ability to move the neck. Headache. Dizziness. Nausea or vomiting. Weakness, numbness, or tingling in a hand or an arm. Symptoms may develop right away after injury or may develop over a few days. In some cases, symptoms may go away with treatment and return (recur) over time. How is this diagnosed? This condition may be diagnosed based on: Your symptoms, medical history, and a physical exam. Any recent injuries or known neck problems that you have, such as arthritis  in the neck. Imaging tests, such as X-rays, an MRI, or a CT scan. How is this treated? This condition is treated by resting and icing the injured area and doing physical therapy exercises to improve movement and strength. Heat therapy may be used 2-3 days after the injury if there is no swelling. Depending on the severity of your condition, treatment may also include: Keeping your neck in place (immobilized) for periods of time. This may be done using: A cervical collar. This supports your chin and the back of your head. A cervical traction device. This is a sling that holds up your head. It removes weight and pressure from your neck. Medicines for pain or other symptoms. Surgery. This is rare. Follow these instructions at home: Medicines Take over-the-counter and prescription medicines only as told by your health care provider. Ask your provider if the medicine prescribed to you: Requires you to avoid driving or using machinery. Can cause constipation. You may need to take these actions to prevent or treat constipation: Drink enough fluid to keep your pee pale yellow. Take over-the-counter or prescription medicines. Eat foods that are high in fiber, such as beans, whole grains, and fresh fruits and vegetables. Limit foods that are high in fat and processed sugars, such as fried or sweet foods. If you have a cervical collar: Wear the collar as told by your provider. Do not remove it unless told. Ask before making any adjustments to your collar. If you have long hair, keep it outside of the collar. If you are allowed to remove the  collar for cleaning and bathing: Follow instructions about how to remove it safely. Clean it by hand with mild soap and water and air-dry it completely. If your collar has removable pads, remove them every 1-2 days and wash them by hand with soap and water. Let them air-dry completely before putting them back in the collar. Tell your provider if your skin under  the collar has irritation or sores. Managing pain, stiffness, and swelling     Use a cervical traction device as told. If told, put ice on the affected area. Put ice in a plastic bag. Place a towel between your skin and the bag. Leave the ice on for 20 minutes, 2-3 times a day. If told, apply heat to the affected area before you exercise or as often as told by your provider. Use the heat source that your provider recommends, such as a moist heat pack or a heating pad. Place a towel between your skin and the heat source. Leave the heat on for 20-30 minutes. If your skin turns bright red, remove the ice or heat right away to prevent skin damage. The risk of damage is higher if you cannot feel pain, heat, or cold. Activity Do not drive while wearing a cervical collar. If you do not have a cervical collar, ask if it is safe to drive while your neck heals. Do not lift anything that is heavier than 10 lb (4.5 kg) until your provider says that it is safe. Rest as told by your provider. Avoid positions and activities that make your symptoms worse. Do physical therapy exercises as told by your provider or physical therapist. Return to your normal activities as told by your provider. Ask your provider what activities are safe for you. General instructions Do not use any products that contain nicotine or tobacco. These products include cigarettes, chewing tobacco, and vaping devices, such as e-cigarettes. These can delay healing. If you need help quitting, ask your provider. Keep all follow-up visits. Your provider will monitor your injury and activity level. How is this prevented? To prevent a cervical sprain from happening again: Use and maintain good posture. Make any needed adjustments to your workstation to help you do this. Exercise regularly as told by your provider or physical therapist. Avoid risky activities that may cause a cervical sprain. Contact a health care provider if: You have  symptoms that get worse or do not get better after 2 weeks of treatment. You have new symptoms. Your pain gets worse or does not get better with medicine. You have sores or irritated skin on your neck from wearing your cervical collar. Get help right away if: You have severe pain. You develop numbness, tingling, or weakness in any part of your body. You cannot move a part of your body (you have paralysis). You have neck pain along with severe dizziness or headache. This information is not intended to replace advice given to you by your health care provider. Make sure you discuss any questions you have with your health care provider. Document Revised: 10/19/2021 Document Reviewed: 10/19/2021 Elsevier Patient Education  2024 [employer].

## 2023-07-09 LAB — CMP14+EGFR
ALT: 14 IU/L (ref 0–44)
AST: 21 IU/L (ref 0–40)
Albumin: 4.7 g/dL (ref 3.9–4.9)
Alkaline Phosphatase: 72 IU/L (ref 44–121)
BUN/Creatinine Ratio: 13 (ref 10–24)
BUN: 20 mg/dL (ref 8–27)
Bilirubin Total: 0.4 mg/dL (ref 0.0–1.2)
CO2: 22 mmol/L (ref 20–29)
Calcium: 9.9 mg/dL (ref 8.6–10.2)
Chloride: 107 mmol/L — ABNORMAL HIGH (ref 96–106)
Creatinine, Ser: 1.49 mg/dL — ABNORMAL HIGH (ref 0.76–1.27)
Globulin, Total: 2.8 g/dL (ref 1.5–4.5)
Glucose: 115 mg/dL — ABNORMAL HIGH (ref 70–99)
Potassium: 4.1 mmol/L (ref 3.5–5.2)
Sodium: 145 mmol/L — ABNORMAL HIGH (ref 134–144)
Total Protein: 7.5 g/dL (ref 6.0–8.5)
eGFR: 51 mL/min/{1.73_m2} — ABNORMAL LOW (ref >59)

## 2023-07-09 LAB — CBC WITH DIFFERENTIAL/PLATELET
Basophils Absolute: 0 10*3/uL (ref 0.0–0.2)
Basos: 0 %
EOS (ABSOLUTE): 0.4 10*3/uL (ref 0.0–0.4)
Eos: 8 %
Hematocrit: 37.7 % (ref 37.5–51.0)
Hemoglobin: 12.2 g/dL — ABNORMAL LOW (ref 13.0–17.7)
Immature Grans (Abs): 0 10*3/uL (ref 0.0–0.1)
Immature Granulocytes: 0 %
Lymphocytes Absolute: 1.3 10*3/uL (ref 0.7–3.1)
Lymphs: 25 %
MCH: 27.4 pg (ref 26.6–33.0)
MCHC: 32.4 g/dL (ref 31.5–35.7)
MCV: 85 fL (ref 79–97)
Monocytes Absolute: 0.8 10*3/uL (ref 0.1–0.9)
Monocytes: 15 %
Neutrophils Absolute: 2.7 10*3/uL (ref 1.4–7.0)
Neutrophils: 52 %
Platelets: 219 10*3/uL (ref 150–450)
RBC: 4.45 x10E6/uL (ref 4.14–5.80)
RDW: 16 % — ABNORMAL HIGH (ref 11.6–15.4)
WBC: 5.2 10*3/uL (ref 3.4–10.8)

## 2023-07-09 LAB — LIPID PANEL
Chol/HDL Ratio: 2.4 ratio (ref 0.0–5.0)
Cholesterol, Total: 164 mg/dL (ref 100–199)
HDL: 69 mg/dL (ref >39)
LDL Chol Calc (NIH): 82 mg/dL (ref 0–99)
Triglycerides: 68 mg/dL (ref 0–149)
VLDL Cholesterol Cal: 13 mg/dL (ref 5–40)

## 2023-07-09 LAB — VITAMIN B12

## 2023-07-15 ENCOUNTER — Encounter: Payer: Self-pay | Admitting: Physician Assistant

## 2023-07-15 ENCOUNTER — Ambulatory Visit (INDEPENDENT_AMBULATORY_CARE_PROVIDER_SITE_OTHER): Admitting: Physician Assistant

## 2023-07-15 VITALS — BP 138/84 | HR 70 | Resp 20 | Wt 122.0 lb

## 2023-07-15 DIAGNOSIS — F0280 Dementia in other diseases classified elsewhere without behavioral disturbance: Secondary | ICD-10-CM

## 2023-07-15 DIAGNOSIS — G309 Alzheimer's disease, unspecified: Secondary | ICD-10-CM

## 2023-07-15 NOTE — Progress Notes (Signed)
 Assessment/Plan:   Dementia likely due to Alzheimer's disease Developmental delay   Eric Stuart is a very pleasant 68 y.o. 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 Patient is currently on memantine 10 mg twice daily and donepezil 10 mg daily. Memory is stable. Patient is able to participate on ADLs . Mood is good.      Follow up in 6 months. continue donepezil 10 mg daily  (monitor the heart rate given his prior history of bradycardia) and memantine 10 mg twice daily, by PCP, side effects discussed Increase activity living recommend good control of her cardiovascular risk factors Continue to control mood as per PCP     Subjective:    This patient is accompanied in the office by his sister who supplements the history.  Previous records as well as any outside records available were reviewed prior to todays visit. Patient was last seen on 12/03/2022 with [facility]SE 12/28    Any changes in memory since last visit?  About the same. He likes to watch NASCAR and Westerns  repeats oneself?  Endorsed Disoriented when walking into a room?  Patient denies     Leaving objects?  May misplace things but not in unusual places   Wandering behavior?  denies   Any personality changes since last visit?  denies   Any worsening depression?:  Denies.   Hallucinations or paranoia?  Denies.   Seizures? denies    Any sleep changes? Sleeps well.  Denies vivid dreams, REM behavior or sleepwalking   Sleep apnea?   Denies.   Any hygiene concerns? Denies.  Independent of bathing and dressing?  Endorsed   Does the patient needs help with medications? He  is in charge, he is forgetting to take his meds, his sister to take over them Who is in charge of the finances?  Sister is in charge     Any changes in appetite?  denies     Patient have trouble swallowing? Denies.   Does the patient cook? No Any headaches?   denies   Chronic back pain  denies. Has some neck arthritis.   Ambulates with difficulty? Denies.    Recent falls or head injuries? denies     Unilateral weakness, numbness or tingling? denies   Any tremors?  Denies   Any anosmia?  Denies   Any incontinence of urine? Denies Any bowel dysfunction?   Denies      Patient lives with his sister Does the patient drive? He does not drive   Initial visit 1/61/0960 How long did patient have memory difficulties?  Patient already carries a diagnosis of dementia likely due to Alzheimer disease per other neurologist.  Over the last year, they memory remains about the same according to his sister.  He may have   some difficulty remembering recent conversations and people names, long term memory is good. "He says he does memory games and word search but, I don't know" repeats oneself?  Denies . "He does not talk much , he never does " Disoriented when walking into a room?  Patient denies  Leaving objects in unusual places?  Leaves stuff on the table and does not replace it   Wandering behavior? denies  She has a doorbell camera just in case Any personality changes since last visit? denies   Any history of depression?:  denies   Hallucinations or paranoia?  denies  "but he has a habit of talking to himself "-always did, sister says  Seizures? denies    Any sleep changes?  Endorsed  Denies vivid dreams, REM behavior or sleepwalking   Sleep apnea? denies   Any hygiene concerns?  denies   Independent of bathing and dressing?   denies  Does the patient need help with medications?  Sister is in charge   Who is in charge of the finances? Sister  is  in charge     Any changes in appetite?  He eats well     Patient have trouble swallowing?  denies   Does the patient cook?  No   Any headaches?  denies   Chronic back pain?  denies   Ambulates with difficulty?  staggers a little Recent falls or head injuries? denies     Vision changes? Denies Unilateral weakness, numbness or tingling?  denies   Any tremors? "Not always. Any anosmia?  denies   Any incontinence of urine? denies   Any bowel dysfunction?    denies      Patient lives  with sister for the last 25 years   History of heavy alcohol intake? denies   History of heavy tobacco use? denies   Family history of dementia? " Not that I know of"-sister says Dose patient drive? Never drove     MRI of the brain, personally reviewed, 01/27/2020 was remarkable for mild cerebral atrophy and chronic small vessel ischemic changes without acute findings   MRI of the C-spine 08/12/2022 shows severe spinal canal stenosis of C4 and C5 with severe impingement of the spinal cord, likely myelomalacia   PREVIOUS MEDICATIONS:   CURRENT MEDICATIONS:  Outpatient Encounter Medications as of 07/15/2023  Medication Sig   amLODipine (NORVASC) 10 MG tablet Take 1 tablet (10 mg total) by mouth daily.   cyanocobalamin (VITAMIN B12) 1000 MCG tablet Take 1,000 mcg by mouth daily.   cyclobenzaprine (FLEXERIL) 10 MG tablet Take 1 tablet (10 mg total) by mouth 3 (three) times daily as needed for muscle spasms.   donepezil (ARICEPT) 10 MG tablet Take 1 tablet (10 mg total) by mouth at bedtime.   memantine (NAMENDA) 10 MG tablet Take 1 tablet (10 mg total) by mouth 2 (two) times daily.   metFORMIN (GLUCOPHAGE) 1000 MG tablet Take 0.5 tablets (500 mg total) by mouth 2 (two) times daily with a meal.   olmesartan-hydrochlorothiazide (BENICAR HCT) 40-25 MG tablet TAKE 1 TABLET DAILY   rosuvastatin (CRESTOR) 10 MG tablet Take 1 tablet (10 mg total) by mouth every evening.   No facility-administered encounter  medications on file as of 07/15/2023.       12/23/2022    2:00 PM 06/17/2022    2:00 PM 11/04/2019   12:00 PM  MMSE - Mini Mental State Exam  Not completed:  Unable to complete   Orientation to time 0 1 2  Orientation to Place 0 1 2  Registration 3 2 3   Attention/ Calculation 0 0 0  Recall 3 2 3   Language- name 2 objects 2 2 2   Language- repeat 1 1 0  Language- follow 3 step command 2 2 3   Language- read & follow direction 1 0 0  Write  a sentence 0 0 0  Copy design 0 0 0  Total score 12 11 15        No data to display          Objective:     PHYSICAL EXAMINATION:    VITALS:   Vitals:   07/15/23 1440  BP: 138/84  Pulse: 70  Resp: 20  SpO2: 98%  Weight: 122 lb (55.3 kg)    GEN:  The patient appears stated age and is in NAD. HEENT:  Normocephalic, atraumatic.   Neurological examination:  General: NAD, well-groomed, appears stated age. Orientation: The patient is alert. Oriented to person, not to place and date Cranial nerves: There is good facial symmetry.The speech is fluent and clear. No aphasia or dysarthria. Fund of knowledge is appropriate. Recent and remote memory are impaired. Attention and concentration are reduced.  Able to name objects and repeat phrases.  Hearing is intact to conversational tone.   Sensation: Sensation is intact to light touch throughout Motor: Strength is at least antigravity x4. DTR's 2/4 in UE/LE     Movement examination: Tone: There is normal tone in the UE/LE Abnormal movements:  no tremor.  No myoclonus.  No asterixis.   Coordination:  There is no decremation with RAM's. Normal finger to nose  Gait and Station: The patient has no  difficulty arising out of a deep-seated chair without the use of the hands. The patient's stride length is good.  Gait is cautious and narrow.    Thank you for allowing us  the opportunity to participate in the care of this nice patient. Please do not hesitate to contact us  for any questions or  concerns.   Total time spent on today's visit was 20 minutes dedicated to this patient today, preparing to see patient, examining the patient, ordering tests and/or medications and counseling the patient, documenting clinical information in the EHR or other health record, independently interpreting results and communicating results to the patient/family, discussing treatment and goals, answering patient's questions and coordinating care.  Cc:  Yevette Hem, FNP  Tex Filbert 07/15/2023 3:20 PM

## 2023-07-15 NOTE — Patient Instructions (Addendum)
 It was a pleasure to see you today at our office.   Recommendations:  Follow up in  6 months Continue donepezil 10 mg daily. Monitor for slow heart rate!  Continue Memantine 10 mg twice daily.  Increase walking      Whom to call:  Memory  decline, memory medications: Call our office [phone]   For psychiatric meds, mood meds: Please have your primary care physician manage these medications.    For assessment of decision of mental capacity and competency:  Call Dr. Erick Blinks, geriatric psychiatrist at [phone]  For guidance in geriatric dementia issues please call [facility] [phone]  For guidance regarding [facility] and if placement were needed at the facility, contact Sidney Ace, Social Worker tel: [phone]  If you have any severe symptoms of a stroke, or other severe issues such as confusion,severe chills or fever, etc call 911 or go to the ER as you may need to be evaluated further    Feel free to go to the following database for funded clinical studies conducted around the world: RankChecks.se   https://www.triadclinicaltrials.com/     RECOMMENDATIONS FOR ALL PATIENTS WITH MEMORY PROBLEMS: 1. Continue to exercise (Recommend 30 minutes of walking everyday, or 3 hours every week) 2. Increase social interactions - continue going to [place] and enjoy social gatherings with friends and family 3. Eat healthy, avoid fried foods and eat more fruits and vegetables 4. Maintain adequate blood pressure, blood sugar, and blood cholesterol level. Reducing the risk of stroke and cardiovascular disease also helps promoting better memory. 5. Avoid stressful situations. Live a simple life and avoid aggravations. Organize your time and prepare for the next day in anticipation. 6. Sleep well, avoid any interruptions of sleep and avoid any distractions in the bedroom that may interfere with adequate sleep quality 7. Avoid  sugar, avoid sweets as there is a strong link between excessive sugar intake, diabetes, and cognitive impairment We discussed the Mediterranean diet, which has been shown to help patients reduce the risk of progressive memory disorders and reduces cardiovascular risk. This includes eating fish, eat fruits and green leafy vegetables, nuts like almonds and hazelnuts, walnuts, and also use olive oil. Avoid fast foods and fried foods as much as possible. Avoid sweets and sugar as sugar use has been linked to worsening of memory function.  There is always a concern of gradual progression of memory problems. If this is the case, then we may need to adjust level of care according to patient needs. Support, both to the patient and caregiver, should then be put into place.    The Alzheimer[employer] Association is here all day, every day for people facing Alzheimer's disease through our free 24/7 Helpline: [phone]. The Helpline provides reliable information and support to all those who need assistance, such as individuals living with memory loss, Alzheimer's or other dementia, caregivers, health care professionals and the public.  Our highly trained and knowledgeable staff can help you with: Understanding memory loss, dementia and Alzheimer's  Medications and other treatment options  General information about aging and brain health  Skills to provide quality care and to find the best care from professionals  Legal, financial and living-arrangement decisions Our Helpline also features: Confidential care consultation provided by master's level clinicians who can help with decision-making support, crisis assistance and education on issues families face every day  Help in a caller's preferred language using our translation service that features more than 200 languages and dialects  Referrals to local community programs, services and ongoing support     FALL PRECAUTIONS: Be cautious when walking. Scan the area  for obstacles that may increase the risk of trips and falls. When getting up in the mornings, sit up at the edge of the bed for a few minutes before getting out of bed. Consider elevating the bed at the head end to avoid drop of blood pressure when getting up. Walk always in a well-lit room (use night lights in the walls). Avoid area rugs or power cords from appliances in the middle of the walkways. Use a walker or a cane if necessary and consider physical therapy for balance exercise. Get your eyesight checked regularly.  FINANCIAL OVERSIGHT: Supervision, especially oversight when making financial decisions or transactions is also recommended.  HOME SAFETY: Consider the safety of the kitchen when operating appliances like stoves, microwave oven, and blender. Consider having supervision and share cooking responsibilities until no longer able to participate in those. Accidents with firearms and other hazards in the house should be identified and addressed as well.   ABILITY TO BE LEFT ALONE: If patient is unable to contact 911 operator, consider using LifeLine, or when the need is there, arrange for someone to stay with patients. Smoking is a fire hazard, consider supervision or cessation. Risk of wandering should be assessed by caregiver and if detected at any point, supervision and safe proof recommendations should be instituted.  MEDICATION SUPERVISION: Inability to self-administer medication needs to be constantly addressed. Implement a mechanism to ensure safe administration of the medications.     Mediterranean Diet A Mediterranean diet refers to food and lifestyle choices that are based on the traditions of countries located on the [employer]. This way of eating has been shown to help prevent certain conditions and improve outcomes for people who have chronic diseases, like kidney disease and heart disease. What are tips for following this plan? Lifestyle  Cook and eat meals together  with your family, when possible. Drink enough fluid to keep your urine clear or pale yellow. Be physically active every day. This includes: Aerobic exercise like running or swimming. Leisure activities like gardening, walking, or housework. Get 7-8 hours of sleep each night. If recommended by your health care provider, drink red wine in moderation. This means 1 glass a day for nonpregnant women and 2 glasses a day for men. A glass of wine equals 5 oz (150 mL). Reading food labels  Check the serving size of packaged foods. For foods such as rice and pasta, the serving size refers to the amount of cooked product, not dry. Check the total fat in packaged foods. Avoid foods that have saturated fat or trans fats. Check the ingredients list for added sugars, such as corn syrup. Shopping  At the grocery store, buy most of your food from the areas near the walls of the store. This includes: Fresh fruits and vegetables (produce). Grains, beans, nuts, and seeds. Some of these may be available in unpackaged forms or large amounts (in bulk). Fresh seafood. Poultry and eggs. Low-fat dairy products. Buy whole ingredients instead of prepackaged foods. Buy fresh fruits and vegetables in-season from local farmers markets. Buy frozen fruits and vegetables in resealable bags. If you do not have access to quality fresh seafood, buy precooked frozen shrimp or canned fish, such as tuna, salmon, or sardines. Buy small amounts of raw or cooked vegetables, salads, or olives from the deli or salad bar at your store. Stock Water quality scientist  so you always have certain foods on hand, such as olive oil, canned tuna, canned tomatoes, rice, pasta, and beans. Cooking  Cook foods with extra-virgin olive oil instead of using butter or other vegetable oils. Have meat as a side dish, and have vegetables or grains as your main dish. This means having meat in small portions or adding small amounts of meat to foods like pasta or  stew. Use beans or vegetables instead of meat in common dishes like chili or lasagna. Experiment with different cooking methods. Try roasting or broiling vegetables instead of steaming or sauteing them. Add frozen vegetables to soups, stews, pasta, or rice. Add nuts or seeds for added healthy fat at each meal. You can add these to yogurt, salads, or vegetable dishes. Marinate fish or vegetables using olive oil, lemon juice, garlic, and fresh herbs. Meal planning  Plan to eat 1 vegetarian meal one day each week. Try to work up to 2 vegetarian meals, if possible. Eat seafood 2 or more times a week. Have healthy snacks readily available, such as: Vegetable sticks with hummus. Greek yogurt. Fruit and nut trail mix. Eat balanced meals throughout the week. This includes: Fruit: 2-3 servings a day Vegetables: 4-5 servings a day Low-fat dairy: 2 servings a day Fish, poultry, or lean meat: 1 serving a day Beans and legumes: 2 or more servings a week Nuts and seeds: 1-2 servings a day Whole grains: 6-8 servings a day Extra-virgin olive oil: 3-4 servings a day Limit red meat and sweets to only a few servings a month What are my food choices? Mediterranean diet Recommended Grains: Whole-grain pasta. Brown rice. Bulgar wheat. Polenta. Couscous. Whole-wheat bread. Dwyane Glad. Vegetables: Artichokes. Beets. Broccoli. Cabbage. Carrots. Eggplant. Green beans. Chard. Kale. Spinach. Onions. Leeks. Peas. Squash. Tomatoes. Peppers. Radishes. Fruits: Apples. Apricots. Avocado. Berries. Bananas. Cherries. Dates. Figs. Grapes. Lemons. Melon. Oranges. Peaches. Plums. Pomegranate. Meats and other protein foods: Beans. Almonds. Sunflower seeds. Pine nuts. Peanuts. Cod. Salmon. Scallops. Shrimp. Tuna. Tilapia. Clams. Oysters. Eggs. Dairy: Low-fat milk. Cheese. Greek yogurt. Beverages: Water. Red wine. Herbal tea. Fats and oils: Extra virgin olive oil. Avocado oil. Grape seed oil. Sweets and desserts:  [place] yogurt with honey. Baked apples. Poached pears. Trail mix. Seasoning and other foods: Basil. Cilantro. Coriander. Cumin. Mint. Parsley. Sage. Rosemary. Tarragon. Garlic. Oregano. Thyme. Pepper. Balsalmic vinegar. Tahini. Hummus. Tomato sauce. Olives. Mushrooms. Limit these Grains: Prepackaged pasta or rice dishes. Prepackaged cereal with added sugar. Vegetables: Deep fried potatoes (french fries). Fruits: Fruit canned in syrup. Meats and other protein foods: Beef. Pork. Lamb. Poultry with skin. Hot dogs. Helene Loader. Dairy: Ice cream. Sour cream. Whole milk. Beverages: Juice. Sugar-sweetened soft drinks. Beer. Liquor and spirits. Fats and oils: Butter. Canola oil. Vegetable oil. Beef fat (tallow). Lard. Sweets and desserts: Cookies. Cakes. Pies. Candy. Seasoning and other foods: Mayonnaise. Premade sauces and marinades. The items listed may not be a complete list. Talk with your dietitian about what dietary choices are right for you. Summary The Mediterranean diet includes both food and lifestyle choices. Eat a variety of fresh fruits and vegetables, beans, nuts, seeds, and whole grains. Limit the amount of red meat and sweets that you eat. Talk with your health care provider about whether it is safe for you to drink red wine in moderation. This means 1 glass a day for nonpregnant women and 2 glasses a day for men. A glass of wine equals 5 oz (150 mL). This information is not intended to replace advice given to you  by your health care provider. Make sure you discuss any questions you have with your health care provider. Document Released: 11/09/2015 Document Revised: 12/12/2015 Document Reviewed: 11/09/2015 Elsevier Interactive Patient Education  2017 [employer].

## 2023-07-16 ENCOUNTER — Other Ambulatory Visit: Payer: Self-pay | Admitting: Family

## 2023-07-16 DIAGNOSIS — E1159 Type 2 diabetes mellitus with other circulatory complications: Secondary | ICD-10-CM

## 2023-09-05 ENCOUNTER — Other Ambulatory Visit: Payer: Self-pay | Admitting: *Deleted

## 2023-09-05 DIAGNOSIS — E1169 Type 2 diabetes mellitus with other specified complication: Secondary | ICD-10-CM

## 2023-09-05 MED ORDER — ROSUVASTATIN CALCIUM 10 MG PO TABS: 10.0000 mg | ORAL_TABLET | Freq: Every evening | ORAL | 1 refills | Status: AC

## 2023-12-03 ENCOUNTER — Encounter: Payer: Self-pay | Admitting: *Deleted

## 2024-01-12 ENCOUNTER — Ambulatory Visit: Admitting: Family

## 2024-01-13 ENCOUNTER — Other Ambulatory Visit: Payer: Self-pay | Admitting: Family

## 2024-01-13 ENCOUNTER — Encounter: Payer: Self-pay | Admitting: Family

## 2024-01-13 ENCOUNTER — Ambulatory Visit (INDEPENDENT_AMBULATORY_CARE_PROVIDER_SITE_OTHER): Admitting: Family

## 2024-01-13 VITALS — BP 129/78 | HR 113 | Temp 98.0°F | Ht 64.0 in

## 2024-01-13 DIAGNOSIS — E1169 Type 2 diabetes mellitus with other specified complication: Secondary | ICD-10-CM | POA: Diagnosis not present

## 2024-01-13 DIAGNOSIS — F0280 Dementia in other diseases classified elsewhere without behavioral disturbance: Secondary | ICD-10-CM | POA: Diagnosis not present

## 2024-01-13 DIAGNOSIS — D869 Sarcoidosis, unspecified: Secondary | ICD-10-CM | POA: Diagnosis not present

## 2024-01-13 DIAGNOSIS — E785 Hyperlipidemia, unspecified: Secondary | ICD-10-CM

## 2024-01-13 DIAGNOSIS — E1159 Type 2 diabetes mellitus with other circulatory complications: Secondary | ICD-10-CM

## 2024-01-13 DIAGNOSIS — G992 Myelopathy in diseases classified elsewhere: Secondary | ICD-10-CM | POA: Diagnosis not present

## 2024-01-13 DIAGNOSIS — I152 Hypertension secondary to endocrine disorders: Secondary | ICD-10-CM | POA: Diagnosis not present

## 2024-01-13 DIAGNOSIS — G309 Alzheimer's disease, unspecified: Secondary | ICD-10-CM | POA: Diagnosis not present

## 2024-01-13 DIAGNOSIS — M4802 Spinal stenosis, cervical region: Secondary | ICD-10-CM | POA: Diagnosis not present

## 2024-01-13 DIAGNOSIS — Z23 Encounter for immunization: Secondary | ICD-10-CM | POA: Diagnosis not present

## 2024-01-13 NOTE — Patient Instructions (Signed)
 Health Maintenance After Age 67 After age 27, you are at a higher risk for certain long-term diseases and infections as well as injuries from falls. Falls are a major cause of broken bones and head injuries in people who are older than age 73. Getting regular preventive care can help to keep you healthy and well. Preventive care includes getting regular testing and making lifestyle changes as recommended by your health care provider. Talk with your health care provider about: Which screenings and tests you should have. A screening is a test that checks for a disease when you have no symptoms. A diet and exercise plan that is right for you. What should I know about screenings and tests to prevent falls? Screening and testing are the best ways to find a health problem early. Early diagnosis and treatment give you the best chance of managing medical conditions that are common after age 90. Certain conditions and lifestyle choices may make you more likely to have a fall. Your health care provider may recommend: Regular vision checks. Poor vision and conditions such as cataracts can make you more likely to have a fall. If you wear glasses, make sure to get your prescription updated if your vision changes. Medicine review. Work with your health care provider to regularly review all of the medicines you are taking, including over-the-counter medicines. Ask your health care provider about any side effects that may make you more likely to have a fall. Tell your health care provider if any medicines that you take make you feel dizzy or sleepy. Strength and balance checks. Your health care provider may recommend certain tests to check your strength and balance while standing, walking, or changing positions. Foot health exam. Foot pain and numbness, as well as not wearing proper footwear, can make you more likely to have a fall. Screenings, including: Osteoporosis screening. Osteoporosis is a condition that causes  the bones to get weaker and break more easily. Blood pressure screening. Blood pressure changes and medicines to control blood pressure can make you feel dizzy. Depression screening. You may be more likely to have a fall if you have a fear of falling, feel depressed, or feel unable to do activities that you used to do. Alcohol  use screening. Using too much alcohol  can affect your balance and may make you more likely to have a fall. Follow these instructions at home: Lifestyle Do not drink alcohol  if: Your health care provider tells you not to drink. If you drink alcohol : Limit how much you have to: 0-1 drink a day for women. 0-2 drinks a day for men. Know how much alcohol  is in your drink. In the [place] one drink equals one 12 oz bottle of beer (355 mL), one 5 oz glass of wine (148 mL), or one 1 oz glass of hard liquor (44 mL). Do not use any products that contain nicotine or tobacco. These products include cigarettes, chewing tobacco, and vaping devices, such as e-cigarettes. If you need help quitting, ask your health care provider. Activity  Follow a regular exercise program to stay fit. This will help you maintain your balance. Ask your health care provider what types of exercise are appropriate for you. If you need a cane or walker, use it as recommended by your health care provider. Wear supportive shoes that have nonskid soles. Safety  Remove any tripping hazards, such as rugs, cords, and clutter. Install safety equipment such as grab bars in bathrooms and safety rails on stairs. Keep rooms and walkways  well-lit. General instructions Talk with your health care provider about your risks for falling. Tell your health care provider if: You fall. Be sure to tell your health care provider about all falls, even ones that seem minor. You feel dizzy, tiredness (fatigue), or off-balance. Take over-the-counter and prescription medicines only as told by your health care provider. These include  supplements. Eat a healthy diet and maintain a healthy weight. A healthy diet includes low-fat dairy products, low-fat (lean) meats, and fiber from whole grains, beans, and lots of fruits and vegetables. Stay current with your vaccines. Schedule regular health, dental, and eye exams. Summary Having a healthy lifestyle and getting preventive care can help to protect your health and wellness after age 15. Screening and testing are the best way to find a health problem early and help you avoid having a fall. Early diagnosis and treatment give you the best chance for managing medical conditions that are more common for people who are older than age 42. Falls are a major cause of broken bones and head injuries in people who are older than age 64. Take precautions to prevent a fall at home. Work with your health care provider to learn what changes you can make to improve your health and wellness and to prevent falls. This information is not intended to replace advice given to you by your health care provider. Make sure you discuss any questions you have with your health care provider. Document Revised: 08/07/2020 Document Reviewed: 08/07/2020 Elsevier Patient Education  2024 [employer].

## 2024-01-13 NOTE — Progress Notes (Signed)
 Subjective:    Patient ID: Eric Stuart, male    DOB: 02-19-1956, 68 y.o.   MRN: [id number]  Chief Complaint  Patient presents with   Medical Management of Chronic Issues   Pt presents to the office today for  chronic follow up.    Pt has hx sarcoidosis. He is followed by Neurologists for Alzheimer's Disease every 6 months. He was started on Aricept  10 mg and namenda  10 mg BID. Sister states this has helped slightly. Reports some days are better than others.    He had cervical decompression on 09/03/22. Reports mild pain in the left side of his neck.  Hypertension This is a chronic problem. The current episode started more than 1 year ago. The problem has been waxing and waning since onset. The problem is uncontrolled. Pertinent negatives include no blurred vision, malaise/fatigue, peripheral edema or shortness of breath. Risk factors for coronary artery disease include dyslipidemia, obesity and male gender. The current treatment provides moderate improvement.  Hyperlipidemia This is a chronic problem. The current episode started more than 1 year ago. The problem is controlled. Recent lipid tests were reviewed and are normal. Pertinent negatives include no shortness of breath. Current antihyperlipidemic treatment includes statins. The current treatment provides moderate improvement of lipids. Risk factors for coronary artery disease include dyslipidemia, diabetes mellitus, hypertension, a sedentary lifestyle and male sex.  Diabetes He presents for his follow-up diabetic visit. He has type 2 diabetes mellitus. Pertinent negatives for diabetes include no blurred vision and no foot paresthesias. Symptoms are stable. Risk factors for coronary artery disease include diabetes mellitus, hypertension, sedentary lifestyle, dyslipidemia and male sex. He is following a generally healthy diet. (Does not check glucose at home) Eye exam is current (has appointment tomorrow).      Review of Systems   Constitutional:  Negative for malaise/fatigue.  Eyes:  Negative for blurred vision.  Respiratory:  Negative for shortness of breath.   All other systems reviewed and are negative.  Family History  Problem Relation Age of Onset   Cancer Mother        cervical   Peripheral Artery Disease Father    Heart failure Father    Diabetes Sister    Heart attack Brother    Heart attack Sister    Stroke Sister    Colon cancer Neg Hx    Colon polyps Neg Hx    Esophageal cancer Neg Hx    Rectal cancer Neg Hx    Stomach cancer Neg Hx    Social History   Socioeconomic History   Marital status: Single    Spouse name: Not on file   Number of children: 0   Years of education: 12   Highest education level: 12th grade  Occupational History   Occupation: disabled  Tobacco Use   Smoking status: Former    Current packs/day: 0.00    Average packs/day: 2.0 packs/day for 4.0 years (8.0 ttl pk-yrs)    Types: Cigarettes    Start date: 09/29/2009    Quit date: 09/29/2013    Years since quitting: 10.2   Smokeless tobacco: Never  Vaping Use   Vaping status: Never Used  Substance and Sexual Activity   Alcohol use: No    Alcohol/week: 0.0 standard drinks of alcohol   Drug use: No   Sexual activity: Not Currently  Other Topics Concern   Not on file  Social History Narrative   Patient is disabled and lives at home with his sister. He is  very active and walks or bicycles around town. He does this daily and probably averages about 50 miles a week on his bike. He is not married and does not have any children.    Are you right handed or left handed? left   Are you currently employed ? 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    What type of home do you live in: 1 story or 2 story? two   Caffeine 2 cups day     Social Drivers of Health   Financial Resource Strain: Low Risk  (04/04/2023)   Overall Financial Resource Strain (CARDIA)    Difficulty  of Paying Living Expenses: Not hard at all  Food Insecurity: No Food Insecurity (04/04/2023)   Hunger Vital Sign    Worried About Running Out of Food in the Last Year: Never true    Ran Out of Food in the Last Year: Never true  Transportation Needs: No Transportation Needs (04/04/2023)   PRAPARE - Administrator, Civil Service (Medical): No    Lack of Transportation (Non-Medical): No  Physical Activity: Insufficiently Active (04/04/2023)   Exercise Vital Sign    Days of Exercise per Week: 3 days    Minutes of Exercise per Session: 30 min  Stress: No Stress Concern Present (04/04/2023)   [employer] of Occupational Health - Occupational Stress Questionnaire    Feeling of Stress : Not at all  Social Connections: Moderately Isolated (04/04/2023)   Social Connection and Isolation Panel    Frequency of Communication with Friends and Family: More than three times a week    Frequency of Social Gatherings with Friends and Family: Three times a week    Attends Religious Services: More than 4 times per year    Active Member of Clubs or Organizations: No    Attends Banker Meetings: Never    Marital Status: Never married       Objective:   Physical Exam Vitals reviewed.  Constitutional:      General: He is not in acute distress.    Appearance: He is well-developed.  HENT:     Head: Normocephalic.     Right Ear: Tympanic membrane normal.     Left Ear: Tympanic membrane normal.  Eyes:     General:        Right eye: No discharge.        Left eye: No discharge.     Pupils: Pupils are equal, round, and reactive to light.  Neck:     Thyroid : No thyromegaly.  Cardiovascular:     Rate and Rhythm: Normal rate and regular rhythm.     Heart sounds: Normal heart sounds. No murmur heard. Pulmonary:     Effort: Pulmonary effort is normal. No respiratory distress.     Breath sounds: Normal breath sounds. No wheezing.  Abdominal:     General: Bowel sounds are normal. There  is no distension.     Palpations: Abdomen is soft.     Tenderness: There is no abdominal tenderness.  Musculoskeletal:        General: No tenderness. Normal range of motion.       Arms:     Cervical back: Normal range of motion and neck supple.     Comments: Full ROM, slight pain in left neck   Skin:    General: Skin is warm and dry.  Findings: No erythema or rash.  Neurological:     Mental Status: He is alert and oriented to person, place, and time.     Cranial Nerves: No cranial nerve deficit.     Deep Tendon Reflexes: Reflexes are normal and symmetric.  Psychiatric:        Mood and Affect: Affect is flat.        Behavior: Behavior normal.        Thought Content: Thought content normal.        Cognition and Memory: Memory is impaired. He exhibits impaired recent memory and impaired remote memory.        Judgment: Judgment normal.       BP (!) 165/99   Pulse (!) 113   Temp 98 F (36.7 C) (Temporal)   Ht 5' 4 (1.626 m)   SpO2 96%   BMI 20.94 kg/m      Assessment & Plan:  Fabrice Dyal comes in today with chief complaint of Medical Management of Chronic Issues   Diagnosis and orders addressed:  1. Encounter for immunization - Flu vaccine HIGH DOSE PF(Fluzone Trivalent)  2. Dementia due to Alzheimer's disease (HCC) - CMP14+EGFR  3. Type 2 diabetes mellitus with other specified complication, without long-term current use of insulin  (HCC) (Primary) - Microalbumin / creatinine urine ratio - CMP14+EGFR  4. Hyperlipidemia due to type 2 diabetes mellitus (HCC)  - CMP14+EGFR  5. Hypertension associated with diabetes (HCC)  - CMP14+EGFR  6. Stenosis of cervical spine with myelopathy (HCC)  - CMP14+EGFR  7. SARCOIDOSIS - CMP14+EGFR  Labs pending Continue current medications  Health Maintenance reviewed Diet and exercise encouraged  Follow up plan: 6 months    Bari Learn, FNP

## 2024-01-14 ENCOUNTER — Ambulatory Visit: Admitting: Physician Assistant

## 2024-01-14 LAB — CMP14+EGFR
ALT: 26 IU/L (ref 0–44)
AST: 28 IU/L (ref 0–40)
Albumin: 4.7 g/dL (ref 3.9–4.9)
Alkaline Phosphatase: 75 IU/L (ref 47–123)
BUN/Creatinine Ratio: 14 (ref 10–24)
BUN: 22 mg/dL (ref 8–27)
Bilirubin Total: 0.5 mg/dL (ref 0.0–1.2)
CO2: 24 mmol/L (ref 20–29)
Calcium: 10.6 mg/dL — ABNORMAL HIGH (ref 8.6–10.2)
Chloride: 103 mmol/L (ref 96–106)
Creatinine, Ser: 1.56 mg/dL — ABNORMAL HIGH (ref 0.76–1.27)
Globulin, Total: 2.9 g/dL (ref 1.5–4.5)
Glucose: 102 mg/dL — ABNORMAL HIGH (ref 70–99)
Potassium: 4.3 mmol/L (ref 3.5–5.2)
Sodium: 141 mmol/L (ref 134–144)
Total Protein: 7.6 g/dL (ref 6.0–8.5)
eGFR: 48 mL/min/1.73 — ABNORMAL LOW (ref >59)

## 2024-01-14 LAB — MICROALBUMIN / CREATININE URINE RATIO
Creatinine, Urine: 75.8 mg/dL
Microalb/Creat Ratio: 154 mg/g{creat} — ABNORMAL HIGH (ref 0–29)
Microalbumin, Urine: 116.5 ug/mL

## 2024-01-14 NOTE — Progress Notes (Incomplete)
 Assessment/Plan:   Dementia likely due to Alzheimer's disease Developmental delay***  Eric Stuart is a very pleasant 68 y.o. 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 Patient is currently on donepezil  10 mg daily and memantine  10 mg twice daily, tolerating well.  Patient is able to participate on ADLs and his mood is stable.  Follow up in   months. Continue donepezil  10 mg daily (monitor for bradycardia), memantine  10 mg twice daily by primary doctor, side effects discussed Recommend good control of her cardiovascular risk factors Continue to control mood as per PCP     Subjective:    This patient is accompanied in the office by his sister*** who supplements the history.  Previous records as well as any outside records available were reviewed prior to todays visit. Patient was last seen on 07/15/2023.  Last MMSE 24 was 12/28***   Any changes in memory since last visit?   Some days are better than others sister says.  He likes to watch NASCAR and westerns repeats oneself?  Endorsed Disoriented when walking into a room? Denies ***  Leaving objects?  May misplace things but not in unusual places***  Wandering behavior?  denies   Any personality changes since last visit?  Denies.   Any worsening depression?:  Denies.   Hallucinations or paranoia?  Denies.   Seizures? denies    Any sleep changes?  Sleeps well.  Denies vivid dreams, REM behavior or sleepwalking   Sleep apnea?   Denies.   Any hygiene concerns? Denies.  Independent of bathing and dressing?  Endorsed  Does the patient needs help with medications?  Sister is in charge after he was forgetting doses*** Who  is in charge of the finances?  Sister is in charge   *** Any changes in appetite?  denies ***   Patient have trouble swallowing? Denies.   Does the patient cook? No Any headaches?   denies   Any vision changes?*** Chronic pain?  Chronic mild left neck pain Ambulates with difficulty? Denies.  *** Recent falls or head injuries? Denies.     Unilateral weakness, numbness or tingling? denies   Any tremors?  Denies  *** Any anosmia?  Denies   Any incontinence of urine?  Endorsed***  Any bowel dysfunction?   Denies      Patient lives with his sister   *** Does the patient drive?  He does not drive, bikes around town, about 50 miles a week***    Initial visit 06/17/2022 How long did patient have memory difficulties?  Patient already carries a diagnosis of dementia likely due to Alzheimer disease per other neurologist.  Over the last year, they memory remains about the same according to his sister.  He may have   some difficulty remembering recent conversations and people names, long term memory is good. He says he does memory games and word search but, I don't know repeats oneself?  Denies . He does not talk much , he never does  Disoriented when walking into a room?  Patient denies  Leaving objects in unusual places? Leaves stuff on the table and does not replace it   Wandering behavior? denies  She has a doorbell camera  just in case Any personality changes since last visit? denies   Any history of depression?:  denies   Hallucinations or paranoia?  denies  but he has a habit of talking to himself -always did, sister says  Seizures? denies    Any sleep changes?  Endorsed  Denies vivid dreams, REM behavior or sleepwalking   Sleep apnea? denies   Any hygiene concerns?  denies   Independent of bathing and dressing?   denies  Does the patient need help with medications?  Sister is in charge   Who is in charge of the finances? Sister  is in charge     Any changes in appetite?  He eats  well     Patient have trouble swallowing?  denies   Does the patient cook?  No   Any headaches?  denies   Chronic back pain?  denies   Ambulates with difficulty?  staggers a little Recent falls or head injuries? denies     Vision changes? Denies Unilateral weakness, numbness or tingling?  denies   Any tremors? Not always. Any anosmia?  denies   Any incontinence of urine? denies   Any bowel dysfunction?    denies      Patient lives  with sister for the last 25 years   History of heavy alcohol intake? denies   History of heavy tobacco use? denies   Family history of dementia?  Not that I know of-sister says Dose patient drive? Never drove     MRI of the brain, personally reviewed, 01/27/2020 was remarkable for mild cerebral atrophy and chronic small vessel ischemic changes without acute findings   MRI of the C-spine 08/12/2022 shows severe spinal canal stenosis of C4 and C5 with severe impingement of the spinal cord, likely myelomalacia PREVIOUS MEDICATIONS:   CURRENT MEDICATIONS:  Outpatient Encounter Medications as of 01/14/2024  Medication Sig   amLODipine  (NORVASC ) 10 MG tablet Take 1 tablet (10 mg total) by mouth daily.   cyanocobalamin  (VITAMIN B12) 1000 MCG tablet Take 1,000 mcg by mouth daily.   cyclobenzaprine  (FLEXERIL ) 10 MG tablet Take 1 tablet (10 mg total) by mouth 3 (three) times daily as needed for muscle spasms.   donepezil  (ARICEPT ) 10 MG tablet Take 1 tablet (10 mg total) by mouth at bedtime.   memantine  (NAMENDA ) 10 MG tablet Take 1 tablet (10 mg total) by mouth 2 (two) times daily.   metFORMIN  (GLUCOPHAGE ) 1000 MG tablet Take 0.5 tablets (500 mg total) by mouth 2 (two) times daily with a meal.   olmesartan -hydrochlorothiazide  (BENICAR  HCT) 40-25 MG tablet TAKE 1 TABLET DAILY   rosuvastatin  (CRESTOR ) 10 MG tablet Take 1 tablet (10 mg total) by mouth every evening.   No facility-administered encounter medications on file as of 01/14/2024.       12/23/2022     2:00 PM 06/17/2022    2:00 PM 11/04/2019   12:00 PM  MMSE - Mini Mental State Exam  Not completed:  Unable to complete   Orientation to time 0 1 2  Orientation to Place 0 1 2  Registration 3 2 3   Attention/ Calculation 0 0 0  Recall 3 2 3   Language- name 2 objects 2 2 2   Language- repeat 1 1 0  Language- follow 3 step command 2 2 3   Language- read & follow direction 1 0 0  Write a sentence 0 0 0  Copy design 0 0 0  Total score 12 11 15  No data to display          Objective:     PHYSICAL EXAMINATION:    VITALS:  There were no vitals filed for this visit.  GEN:  The patient appears stated age and is in NAD. HEENT:  Normocephalic, atraumatic.   Neurological examination:  General: NAD, well-groomed, appears stated age. Orientation: The patient is alert. Oriented to person, not to place and date Cranial nerves: There is good facial symmetry.The speech is fluent and clear. No aphasia or dysarthria. Fund of knowledge is appropriate. Recent and remote memory are impaired. Attention and concentration are reduced. Able to name objects and repeat phrases.  Hearing is intact to conversational tone. *** Sensation: Sensation is intact to light touch throughout Motor: Strength is at least antigravity x4. DTR's 2/4 in UE/LE     Movement examination: Tone: There is normal tone in the UE/LE Abnormal movements:  no tremor.  No myoclonus.  No asterixis.   Coordination:  There is no decremation with RAM's. Normal finger to nose  Gait and Station: The patient has no*** difficulty arising out of a deep-seated chair without the use of the hands. The patient's stride length is good.  Gait is cautious and narrow.    Thank you for allowing us  the opportunity to participate in the care of this nice patient. Please do not hesitate to contact us  for any questions or concerns.   Total time spent on today's visit was *** minutes dedicated to this patient today, preparing to see patient,  examining the patient, ordering tests and/or medications and counseling the patient, documenting clinical information in the EHR or other health record, independently interpreting results and communicating results to the patient/family, discussing treatment and goals, answering patient's questions and coordinating care.  Cc:  Lavell Bari LABOR, FNP  Camie Sevin 01/14/2024 6:43 AM

## 2024-01-15 ENCOUNTER — Other Ambulatory Visit: Payer: Self-pay | Admitting: Family

## 2024-01-15 ENCOUNTER — Ambulatory Visit: Payer: Self-pay | Admitting: Family

## 2024-01-15 MED ORDER — EMPAGLIFLOZIN 10 MG PO TABS: 10.0000 mg | ORAL_TABLET | Freq: Every day | ORAL | 2 refills | Status: DC

## 2024-02-09 ENCOUNTER — Other Ambulatory Visit: Payer: Self-pay | Admitting: Family

## 2024-02-09 DIAGNOSIS — Z0000 Encounter for general adult medical examination without abnormal findings: Secondary | ICD-10-CM

## 2024-02-09 DIAGNOSIS — I152 Hypertension secondary to endocrine disorders: Secondary | ICD-10-CM

## 2024-03-30 ENCOUNTER — Other Ambulatory Visit: Payer: Self-pay | Admitting: Family

## 2024-03-30 DIAGNOSIS — F0280 Dementia in other diseases classified elsewhere without behavioral disturbance: Secondary | ICD-10-CM

## 2024-04-14 ENCOUNTER — Other Ambulatory Visit: Payer: Self-pay | Admitting: Family

## 2024-04-27 ENCOUNTER — Other Ambulatory Visit: Payer: Self-pay | Admitting: Family

## 2024-04-27 ENCOUNTER — Ambulatory Visit

## 2024-04-27 VITALS — BP 129/78 | HR 113 | Ht 64.0 in | Wt 122.0 lb

## 2024-04-27 DIAGNOSIS — Z0000 Encounter for general adult medical examination without abnormal findings: Secondary | ICD-10-CM

## 2024-04-27 NOTE — Progress Notes (Signed)
 "  Chief Complaint  Patient presents with   Medicare Wellness     Subjective:   Eric Stuart is a 69 y.o. male who presents for a Medicare Annual Wellness Visit.  Visit info / Clinical Intake: Medicare Wellness Visit Type:: Subsequent Annual Wellness Visit Persons participating in visit and providing information:: patient & caregiver (pt's sister, Inocente, pt verbalized sister is helping pt w/awv) Medicare Wellness Visit Mode:: Telephone If telephone:: video declined Since this visit was completed virtually, some vitals may be partially provided or unavailable. Missing vitals are due to the limitations of the virtual format.: Unable to obtain vitals - no equipment If Telephone or Video please confirm:: I connected with patient using audio/video enable telemedicine. I verified patient identity with two identifiers, discussed telehealth limitations, and patient agreed to proceed. Patient Location:: home Provider Location:: office Interpreter Needed?: No Pre-visit prep was completed: yes AWV questionnaire completed by patient prior to visit?: no Living arrangements:: with family/others (sister, Inocente) Patient's Overall Health Status Rating: very good Typical amount of pain: none Does pain affect daily life?: no Are you currently prescribed opioids?: no  Dietary Habits and Nutritional Risks How many meals a day?: 3 Eats fruit and vegetables daily?: yes Most meals are obtained by: preparing own meals In the last 2 weeks, have you had any of the following?: none Diabetic:: (!) yes Any non-healing wounds?: no How often do you check your BS?: 0 Would you like to be referred to a Nutritionist or for Diabetic Management? : no  Functional Status Activities of Daily Living (to include ambulation/medication): Independent Ambulation: Independent Medication Administration: Needs assistance (comment) (sister helps) Home Management (perform basic housework or laundry): Needs assistance  (comment) (sister helps) Manage your own finances?: (!) no (sister helps) Primary transportation is: family / friends (sister helps) Concerns about vision?: no *vision screening is required for WTM* (last ov 2025) Concerns about hearing?: (!) yes Uses hearing aids?: no (will check w/insurance about getting hearing aids per pt sister)  Fall Screening Falls in the past year?: 0 Number of falls in past year: 0 Was there an injury with Fall?: 0 Fall Risk Category Calculator: 0 Patient Fall Risk Level: Low Fall Risk  Fall Risk Patient at Risk for Falls Due to: No Fall Risks Fall risk Follow up: Falls evaluation completed; Education provided  Home and Transportation Safety: All rugs have non-skid backing?: yes All stairs or steps have railings?: yes Grab bars in the bathtub or shower?: yes Have non-skid surface in bathtub or shower?: yes Good home lighting?: yes Regular seat belt use?: yes Hospital stays in the last year:: no  Cognitive Assessment Difficulty concentrating, remembering, or making decisions? : no Will 6CIT or Mini Cog be Completed: yes What year is it?: 0 points (with sistsister helpser's help) What month is it?: 0 points (with sistsister helpser's help) Give patient an address phrase to remember (5 components): [address] [place] (with sistsister helpser's help) About what time is it?: 0 points Count backwards from 20 to 1: 0 points Say the months of the year in reverse: 0 points Repeat the address phrase from earlier: 0 points (with sistsister helpser's help) 6 CIT Score: 0 points  Advance Directives (For Healthcare) Does Patient Have a Medical Advance Directive?: Yes Type of Advance Directive: Healthcare Power of Attorney Copy of Healthcare Power of Attorney in Chart?: Yes - validated most recent copy scanned in chart (See row information) Would patient like information on creating a medical advance directive?: No -  Patient  declined  Reviewed/Updated  Reviewed/Updated: Reviewed All (Medical, Surgical, Family, Medications, Allergies, Care Teams, Patient Goals)    Allergies (verified) Patient has no known allergies.   Current Medications (verified) Outpatient Encounter Medications as of 04/27/2024  Medication Sig   amLODipine  (NORVASC ) 10 MG tablet TAKE ONE TABLET ONCE DAILY   cyanocobalamin  (VITAMIN B12) 1000 MCG tablet Take 1,000 mcg by mouth daily.   cyclobenzaprine  (FLEXERIL ) 10 MG tablet Take 1 tablet (10 mg total) by mouth 3 (three) times daily as needed for muscle spasms.   donepezil  (ARICEPT ) 10 MG tablet TAKE 1 TABLET AT BEDTIME   JARDIANCE  10 MG TABS tablet TAKE ONE TABLET DAILY BEFORE BREAKFAST   memantine  (NAMENDA ) 10 MG tablet Take 1 tablet (10 mg total) by mouth 2 (two) times daily.   metFORMIN  (GLUCOPHAGE ) 1000 MG tablet TAKE 1/2 TABLET TWICE DAILY WITH MEAL(S)   olmesartan -hydrochlorothiazide  (BENICAR  HCT) 40-25 MG tablet TAKE 1 TABLET DAILY   rosuvastatin  (CRESTOR ) 10 MG tablet Take 1 tablet (10 mg total) by mouth every evening.   No facility-administered encounter medications on file as of 04/27/2024.    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   Diabetes mellitus without complication (HCC)    Hyperlipidemia    Hypertension    Sarcoidosis 17   Sister provided    Past Surgical History:  Procedure Laterality Date   ADENOIDECTOMY  childhod   ANTERIOR CERVICAL DECOMP/DISCECTOMY FUSION N/A 09/03/2022   Procedure: Anterior Cervical Decompression Fusion  Cervical three-four, Cervical four-five;  Surgeon: Louis Shove, MD;  Location: [facility] OR;  Service: Neurosurgery;  Laterality: N/A;   COLONOSCOPY  2012   TONSILLECTOMY     Family History  Problem Relation Age of Onset   Cancer Mother        cervical   Peripheral Artery Disease Father    Heart failure Father    Diabetes Sister    Heart attack Brother     Heart attack Sister    Stroke Sister    Colon cancer Neg Hx    Colon polyps Neg Hx    Esophageal cancer Neg Hx    Rectal cancer Neg Hx    Stomach cancer Neg Hx    Social History   Occupational History   Occupation: disabled  Tobacco Use   Smoking status: Former    Current packs/day: 0.00    Average packs/day: 2.0 packs/day for 4.0 years (8.0 ttl pk-yrs)    Types: Cigarettes    Start date: 09/29/2009    Quit date: 09/29/2013    Years since quitting: 10.5   Smokeless tobacco: Never  Vaping Use   Vaping status: Never Used  Substance and Sexual Activity   Alcohol use: No    Alcohol/week: 0.0 standard drinks of alcohol   Drug use: No   Sexual activity: Not Currently   Tobacco Counseling Counseling given: Yes  SDOH Screenings   Food Insecurity: No Food Insecurity (04/27/2024)  Housing: Unknown (04/27/2024)  Transportation Needs: No Transportation Needs (04/27/2024)  Utilities: Not At Risk (04/27/2024)  Alcohol Screen: Low Risk (04/04/2023)  Depression (PHQ2-9): Low Risk (04/27/2024)  Financial Resource Strain: Low Risk (04/04/2023)  Physical Activity: Insufficiently Active (04/27/2024)  Social Connections: Moderately Isolated (04/27/2024)  Stress: No Stress Concern Present (04/27/2024)  Tobacco Use: Medium Risk (04/27/2024)  Health Literacy: Inadequate Health Literacy (04/27/2024)   See flowsheets for full screening details  Depression Screen PHQ 2 &  9 Depression Scale- Over the past 2 weeks, how often have you been bothered by any of the following problems? Little interest or pleasure in doing things: 0 Feeling down, depressed, or hopeless (PHQ Adolescent also includes...irritable): 0 PHQ-2 Total Score: 0 Trouble falling or staying asleep, or sleeping too much: 0 Feeling tired or having little energy: 0 Poor appetite or overeating (PHQ Adolescent also includes...weight loss): 0 Feeling bad about yourself - or that you are a failure or have let yourself or your family down:  0 Trouble concentrating on things, such as reading the newspaper or watching television (PHQ Adolescent also includes...like school work): 0 Moving or speaking so slowly that other people could have noticed. Or the opposite - being so fidgety or restless that you have been moving around a lot more than usual: 0 Thoughts that you would be better off dead, or of hurting yourself in some way: 0 PHQ-9 Total Score: 0 If you checked off any problems, how difficult have these problems made it for you to do your work, take care of things at home, or get along with other people?: Not difficult at all     Goals Addressed             This Visit's Progress    Prevent falls   On track    Increased problems with instability              Objective:    Today's Vitals   04/27/24 1028  BP: 129/78  Pulse: (!) 113  Weight: 122 lb (55.3 kg)  Height: 5' 4 (1.626 m)   Body mass index is 20.94 kg/m.  Hearing/Vision screen No results found. Immunizations and Health Maintenance Health Maintenance  Topic Date Due   FOOT EXAM  09/09/2023   COVID-19 Vaccine (4 - 2025-26 season) 12/01/2023   HEMOGLOBIN A1C  01/07/2024   OPHTHALMOLOGY EXAM  01/07/2024   DTaP/Tdap/Td (4 - Td or Tdap) 01/12/2025 (Originally 03/02/2023)   Pneumococcal Vaccine: 50+ Years (3 of 3 - PCV20 or PCV21) 01/12/2025 (Originally 02/14/2023)   Diabetic kidney evaluation - Urine ACR  07/13/2024   Diabetic kidney evaluation - eGFR measurement  01/12/2025   Medicare Annual Wellness (AWV)  04/27/2025   Colonoscopy  09/14/2027   Influenza Vaccine  Completed   Hepatitis C Screening  Completed   Zoster Vaccines- Shingrix   Completed   Meningococcal B Vaccine  Aged Out        Assessment/Plan:  This is a routine wellness examination for Roran.  Patient Care Team: Lavell Bari LABOR, FNP as PCP - General (Nurse Practitioner) Nivia Sally Alice, OD (Optometry) Dina Sara E, PA-C (Neurology) Louis Shove, MD as Consulting  Physician (Neurosurgery)  I have personally reviewed and noted the following in the patients chart:   Medical and social history Use of alcohol, tobacco or illicit drugs  Current medications and supplements including opioid prescriptions. Functional ability and status Nutritional status Physical activity Advanced directives List of other physicians Hospitalizations, surgeries, and ER visits in previous 12 months Vitals Screenings to include cognitive, depression, and falls Referrals and appointments  No orders of the defined types were placed in this encounter.  In addition, I have reviewed and discussed with patient certain preventive protocols, quality metrics, and best practice recommendations. A written personalized care plan for preventive services as well as general preventive health recommendations were provided to patient.   Ozie Ned, CMA   04/27/2024   Return in 1 year (on 04/27/2025).  After  Visit Summary: (MyChart) Due to this being a telephonic visit, the after visit summary with patients personalized plan was offered to patient via MyChart   Nurse Notes: Pt sister, Inocente, is aware and understand pt is due for the following HM: Covid--pt did not get, Diabetic Eye exam--will make appt, foot exam and A1c will get at next pcp visit 07/14/23 "

## 2024-04-27 NOTE — Patient Instructions (Signed)
 Eric Stuart,  Thank you for taking the time for your Medicare Wellness Visit. I appreciate your continued commitment to your health goals. Please review the care plan we discussed, and feel free to reach out if I can assist you further.  Please note that Annual Wellness Visits do not include a physical exam. Some assessments may be limited, especially if the visit was conducted virtually. If needed, we may recommend an in-person follow-up with your provider.  Ongoing Care Seeing your primary care provider every 3 to 6 months helps us  monitor your health and provide consistent, personalized care.   Referrals If a referral was made during today's visit and you haven't received any updates within two weeks, please contact the referred provider directly to check on the status.  Recommended Screenings:  Health Maintenance  Topic Date Due   Complete foot exam   09/09/2023   COVID-19 Vaccine (4 - 2025-26 season) 12/01/2023   Hemoglobin A1C  01/07/2024   Eye exam for diabetics  01/07/2024   Medicare Annual Wellness Visit  04/03/2024   DTaP/Tdap/Td vaccine (4 - Td or Tdap) 01/12/2025*   Pneumococcal Vaccine for age over 25 (3 of 3 - PCV20 or PCV21) 01/12/2025*   Kidney health urinalysis for diabetes  07/13/2024   Yearly kidney function blood test for diabetes  01/12/2025   Colon Cancer Screening  09/14/2027   Flu Shot  Completed   Hepatitis C Screening  Completed   Zoster (Shingles) Vaccine  Completed   Meningitis B Vaccine  Aged Out  *Topic was postponed. The date shown is not the original due date.       04/27/2024   10:31 AM  Advanced Directives  Does Patient Have a Medical Advance Directive? Yes  Type of Advance Directive Healthcare Power of Attorney  Copy of Healthcare Power of Attorney in Chart? Yes - validated most recent copy scanned in chart (See row information)    Vision: Annual vision screenings are recommended for early detection of glaucoma, cataracts, and diabetic  retinopathy. These exams can also reveal signs of chronic conditions such as diabetes and high blood pressure.  Dental: Annual dental screenings help detect early signs of oral cancer, gum disease, and other conditions linked to overall health, including heart disease and diabetes.  Please see the attached documents for additional preventive care recommendations.

## 2024-07-13 ENCOUNTER — Ambulatory Visit: Payer: Self-pay | Admitting: Family

## 2025-04-28 ENCOUNTER — Ambulatory Visit
